# Patient Record
Sex: Female | Born: 1935 | Race: White | Hispanic: No | Smoking: Never smoker
Health system: Southern US, Community
[De-identification: ages and names within clinical notes are randomized; demographics above are authoritative.]

## PROBLEM LIST (undated history)

## (undated) DIAGNOSIS — R002 Palpitations: Secondary | ICD-10-CM

## (undated) DIAGNOSIS — R1013 Epigastric pain: Secondary | ICD-10-CM

## (undated) DIAGNOSIS — E7800 Pure hypercholesterolemia, unspecified: Secondary | ICD-10-CM

## (undated) DIAGNOSIS — R5383 Other fatigue: Secondary | ICD-10-CM

## (undated) DIAGNOSIS — R0602 Shortness of breath: Secondary | ICD-10-CM

## (undated) DIAGNOSIS — I119 Hypertensive heart disease without heart failure: Secondary | ICD-10-CM

## (undated) DIAGNOSIS — H547 Unspecified visual loss: Secondary | ICD-10-CM

## (undated) DIAGNOSIS — M2550 Pain in unspecified joint: Secondary | ICD-10-CM

## (undated) DIAGNOSIS — Z8719 Personal history of other diseases of the digestive system: Secondary | ICD-10-CM

## (undated) DIAGNOSIS — M62838 Other muscle spasm: Secondary | ICD-10-CM

## (undated) DIAGNOSIS — I10 Essential (primary) hypertension: Secondary | ICD-10-CM

## (undated) DIAGNOSIS — R0789 Other chest pain: Secondary | ICD-10-CM

## (undated) DIAGNOSIS — Z8739 Personal history of other diseases of the musculoskeletal system and connective tissue: Secondary | ICD-10-CM

## (undated) DIAGNOSIS — Q248 Other specified congenital malformations of heart: Secondary | ICD-10-CM

## (undated) DIAGNOSIS — T887XXA Unspecified adverse effect of drug or medicament, initial encounter: Secondary | ICD-10-CM

## (undated) DIAGNOSIS — I341 Nonrheumatic mitral (valve) prolapse: Secondary | ICD-10-CM

## (undated) DIAGNOSIS — E785 Hyperlipidemia, unspecified: Secondary | ICD-10-CM

## (undated) DIAGNOSIS — G44009 Cluster headache syndrome, unspecified, not intractable: Secondary | ICD-10-CM

## (undated) DIAGNOSIS — I639 Cerebral infarction, unspecified: Secondary | ICD-10-CM

## (undated) DIAGNOSIS — R42 Dizziness and giddiness: Secondary | ICD-10-CM

## (undated) DIAGNOSIS — R200 Anesthesia of skin: Secondary | ICD-10-CM

## (undated) DIAGNOSIS — Z8679 Personal history of other diseases of the circulatory system: Secondary | ICD-10-CM

## (undated) DIAGNOSIS — R112 Nausea with vomiting, unspecified: Secondary | ICD-10-CM

## (undated) HISTORY — PX: CERVICAL FUSION: SHX112

## (undated) HISTORY — DX: Unspecified visual loss: H54.7

## (undated) HISTORY — PX: CARPAL TUNNEL RELEASE: SHX101

## (undated) HISTORY — DX: Nonrheumatic mitral (valve) prolapse: I34.1

## (undated) HISTORY — DX: Anesthesia of skin: R20.0

## (undated) HISTORY — DX: Unspecified adverse effect of drug or medicament, initial encounter: T88.7XXA

## (undated) HISTORY — DX: Personal history of other diseases of the musculoskeletal system and connective tissue: Z87.39

## (undated) HISTORY — DX: Shortness of breath: R06.02

## (undated) HISTORY — DX: Pain in unspecified joint: M25.50

## (undated) HISTORY — PX: TOTAL ABDOMINAL HYSTERECTOMY: SHX209

## (undated) HISTORY — DX: Other chest pain: R07.89

## (undated) HISTORY — DX: Palpitations: R00.2

## (undated) HISTORY — PX: TONSILLECTOMY: SHX5217

## (undated) HISTORY — PX: BREAST BIOPSY: SHX20

## (undated) HISTORY — DX: Cluster headache syndrome, unspecified, not intractable: G44.009

## (undated) HISTORY — PX: CHOLECYSTECTOMY: SHX55

## (undated) HISTORY — DX: Nausea with vomiting, unspecified: R11.2

## (undated) HISTORY — DX: Other fatigue: R53.83

## (undated) HISTORY — DX: Dizziness and giddiness: R42

## (undated) HISTORY — DX: Epigastric pain: R10.13

## (undated) HISTORY — PX: TOOTH EXTRACTION: SUR596

## (undated) HISTORY — DX: Other specified congenital malformations of heart: Q24.8

## (undated) HISTORY — DX: Essential (primary) hypertension: I10

## (undated) HISTORY — DX: Pure hypercholesterolemia, unspecified: E78.00

## (undated) HISTORY — PX: TRIGGER FINGER RELEASE: SHX641

## (undated) HISTORY — PX: BLADDER FULGURATION: SHX1237

## (undated) HISTORY — DX: Hyperlipidemia, unspecified: E78.5

## (undated) HISTORY — PX: HEMORRHOID SURGERY: SHX153

## (undated) HISTORY — PX: LUMBAR LAMINECTOMY: SHX95

## (undated) HISTORY — DX: Other muscle spasm: M62.838

## (undated) HISTORY — DX: Personal history of other diseases of the digestive system: Z87.19

## (undated) HISTORY — DX: Personal history of other diseases of the circulatory system: Z86.79

---

## 1999-04-24 ENCOUNTER — Encounter: Payer: Self-pay | Admitting: Cardiology

## 1999-04-24 ENCOUNTER — Encounter: Admission: RE | Admit: 1999-04-24 | Discharge: 1999-04-24 | Payer: Self-pay | Admitting: Cardiology

## 2000-05-31 ENCOUNTER — Encounter: Admission: RE | Admit: 2000-05-31 | Discharge: 2000-05-31 | Payer: Self-pay | Admitting: Cardiology

## 2000-05-31 ENCOUNTER — Encounter: Payer: Self-pay | Admitting: Cardiology

## 2001-03-16 ENCOUNTER — Encounter: Payer: Self-pay | Admitting: Neurosurgery

## 2001-03-16 ENCOUNTER — Encounter: Admission: RE | Admit: 2001-03-16 | Discharge: 2001-03-16 | Payer: Self-pay | Admitting: Neurosurgery

## 2001-06-02 ENCOUNTER — Encounter: Payer: Self-pay | Admitting: Cardiology

## 2001-06-02 ENCOUNTER — Encounter: Admission: RE | Admit: 2001-06-02 | Discharge: 2001-06-02 | Payer: Self-pay | Admitting: Cardiology

## 2002-12-05 ENCOUNTER — Encounter: Payer: Self-pay | Admitting: Ophthalmology

## 2002-12-07 ENCOUNTER — Ambulatory Visit (HOSPITAL_COMMUNITY): Admission: RE | Admit: 2002-12-07 | Discharge: 2002-12-07 | Payer: Self-pay | Admitting: Ophthalmology

## 2003-09-13 ENCOUNTER — Inpatient Hospital Stay (HOSPITAL_COMMUNITY): Admission: EM | Admit: 2003-09-13 | Discharge: 2003-09-19 | Payer: Self-pay | Admitting: Emergency Medicine

## 2003-09-16 ENCOUNTER — Encounter: Payer: Self-pay | Admitting: Cardiology

## 2004-10-27 ENCOUNTER — Ambulatory Visit: Payer: Self-pay | Admitting: Cardiology

## 2006-01-17 ENCOUNTER — Encounter: Admission: RE | Admit: 2006-01-17 | Discharge: 2006-01-17 | Payer: Self-pay | Admitting: Gastroenterology

## 2006-02-22 ENCOUNTER — Encounter: Admission: RE | Admit: 2006-02-22 | Discharge: 2006-02-22 | Payer: Self-pay | Admitting: Cardiology

## 2007-03-20 ENCOUNTER — Encounter: Admission: RE | Admit: 2007-03-20 | Discharge: 2007-03-20 | Payer: Self-pay | Admitting: Cardiology

## 2007-07-18 ENCOUNTER — Ambulatory Visit: Payer: Self-pay | Admitting: Internal Medicine

## 2008-02-15 ENCOUNTER — Other Ambulatory Visit: Payer: Self-pay

## 2008-02-16 ENCOUNTER — Inpatient Hospital Stay: Payer: Self-pay | Admitting: Specialist

## 2008-05-08 ENCOUNTER — Encounter: Admission: RE | Admit: 2008-05-08 | Discharge: 2008-05-08 | Payer: Self-pay | Admitting: Cardiology

## 2009-04-15 ENCOUNTER — Encounter: Admission: RE | Admit: 2009-04-15 | Discharge: 2009-04-15 | Payer: Self-pay | Admitting: Cardiology

## 2009-07-13 ENCOUNTER — Emergency Department: Payer: Self-pay | Admitting: Emergency Medicine

## 2009-08-02 ENCOUNTER — Ambulatory Visit: Payer: Self-pay | Admitting: Orthopedic Surgery

## 2009-09-25 ENCOUNTER — Inpatient Hospital Stay (HOSPITAL_COMMUNITY): Admission: EM | Admit: 2009-09-25 | Discharge: 2009-10-02 | Payer: Self-pay | Admitting: Emergency Medicine

## 2009-09-25 ENCOUNTER — Ambulatory Visit: Payer: Self-pay | Admitting: Cardiology

## 2009-12-03 ENCOUNTER — Inpatient Hospital Stay (HOSPITAL_COMMUNITY): Admission: RE | Admit: 2009-12-03 | Discharge: 2009-12-06 | Payer: Self-pay | Admitting: Neurosurgery

## 2010-02-12 ENCOUNTER — Ambulatory Visit: Payer: Self-pay | Admitting: Cardiology

## 2010-06-26 ENCOUNTER — Ambulatory Visit: Payer: Self-pay | Admitting: Cardiology

## 2010-08-30 LAB — COMPREHENSIVE METABOLIC PANEL
AST: 21 U/L (ref 0–37)
Albumin: 3.8 g/dL (ref 3.5–5.2)
Alkaline Phosphatase: 67 U/L (ref 39–117)
CO2: 25 mEq/L (ref 19–32)
Chloride: 103 mEq/L (ref 96–112)
Creatinine, Ser: 1.07 mg/dL (ref 0.4–1.2)
GFR calc Af Amer: 59 mL/min — ABNORMAL LOW (ref >60)
GFR calc non Af Amer: 49 mL/min — ABNORMAL LOW (ref >60)
Potassium: 4 mEq/L (ref 3.5–5.1)
Total Bilirubin: 0.7 mg/dL (ref 0.3–1.2)

## 2010-08-30 LAB — CBC
HCT: 39.5 % (ref 36.0–46.0)
MCV: 97 fL (ref 78.0–100.0)
Platelets: 232 10*3/uL (ref 150–400)
RBC: 4.07 MIL/uL (ref 3.87–5.11)
WBC: 13.7 10*3/uL — ABNORMAL HIGH (ref 4.0–10.5)

## 2010-08-30 LAB — DIFFERENTIAL
Basophils Absolute: 0 10*3/uL (ref 0.0–0.1)
Basophils Relative: 0 % (ref 0–1)
Eosinophils Absolute: 0.5 10*3/uL (ref 0.0–0.7)
Eosinophils Relative: 4 % (ref 0–5)
Monocytes Absolute: 1.1 10*3/uL — ABNORMAL HIGH (ref 0.1–1.0)

## 2010-08-30 LAB — URINALYSIS, ROUTINE W REFLEX MICROSCOPIC
Bilirubin Urine: NEGATIVE
Glucose, UA: NEGATIVE mg/dL
Specific Gravity, Urine: 1.022 (ref 1.005–1.030)
Urobilinogen, UA: 0.2 mg/dL (ref 0.0–1.0)
pH: 5.5 (ref 5.0–8.0)

## 2010-08-30 LAB — PROTIME-INR: Prothrombin Time: 13.4 seconds (ref 11.6–15.2)

## 2010-08-30 LAB — URINE MICROSCOPIC-ADD ON

## 2010-09-01 LAB — COMPREHENSIVE METABOLIC PANEL
ALT: 23 U/L (ref 0–35)
AST: 22 U/L (ref 0–37)
Alkaline Phosphatase: 68 U/L (ref 39–117)
CO2: 22 mEq/L (ref 19–32)
Calcium: 8.3 mg/dL — ABNORMAL LOW (ref 8.4–10.5)
GFR calc Af Amer: >60 mL/min (ref >60)
Potassium: 3.3 mEq/L — ABNORMAL LOW (ref 3.5–5.1)
Sodium: 131 mEq/L — ABNORMAL LOW (ref 135–145)
Total Protein: 5.4 g/dL — ABNORMAL LOW (ref 6.0–8.3)

## 2010-09-01 LAB — BASIC METABOLIC PANEL
BUN: 4 mg/dL — ABNORMAL LOW (ref 6–23)
CO2: 23 mEq/L (ref 19–32)
CO2: 23 mEq/L (ref 19–32)
Calcium: 8.3 mg/dL — ABNORMAL LOW (ref 8.4–10.5)
Calcium: 8.4 mg/dL (ref 8.4–10.5)
Calcium: 8.4 mg/dL (ref 8.4–10.5)
Chloride: 109 mEq/L (ref 96–112)
Chloride: 110 mEq/L (ref 96–112)
Chloride: 112 mEq/L (ref 96–112)
Creatinine, Ser: 0.81 mg/dL (ref 0.4–1.2)
Creatinine, Ser: 0.85 mg/dL (ref 0.4–1.2)
Creatinine, Ser: 0.97 mg/dL (ref 0.4–1.2)
GFR calc Af Amer: >60 mL/min (ref >60)
GFR calc Af Amer: >60 mL/min (ref >60)
GFR calc Af Amer: >60 mL/min (ref >60)
GFR calc Af Amer: >60 mL/min (ref >60)
GFR calc non Af Amer: 54 mL/min — ABNORMAL LOW (ref >60)
GFR calc non Af Amer: >60 mL/min (ref >60)
GFR calc non Af Amer: >60 mL/min (ref >60)
Potassium: 3.1 mEq/L — ABNORMAL LOW (ref 3.5–5.1)
Sodium: 139 mEq/L (ref 135–145)

## 2010-09-01 LAB — CLOSTRIDIUM DIFFICILE EIA

## 2010-09-01 LAB — CBC
Hemoglobin: 11.9 g/dL — ABNORMAL LOW (ref 12.0–15.0)
MCHC: 34.7 g/dL (ref 30.0–36.0)
MCHC: 35.4 g/dL (ref 30.0–36.0)
MCV: 97 fL (ref 78.0–100.0)
Platelets: 166 10*3/uL (ref 150–400)
RBC: 3.42 MIL/uL — ABNORMAL LOW (ref 3.87–5.11)
RBC: 3.52 MIL/uL — ABNORMAL LOW (ref 3.87–5.11)
RBC: 3.96 MIL/uL (ref 3.87–5.11)
RDW: 13.2 % (ref 11.5–15.5)
WBC: 10.1 10*3/uL (ref 4.0–10.5)
WBC: 8.8 10*3/uL (ref 4.0–10.5)

## 2010-09-01 LAB — HEMOCCULT GUIAC POC 1CARD (OFFICE): Fecal Occult Bld: NEGATIVE

## 2010-09-02 LAB — COMPREHENSIVE METABOLIC PANEL
CO2: 25 mEq/L (ref 19–32)
Calcium: 8.9 mg/dL (ref 8.4–10.5)
Creatinine, Ser: 0.8 mg/dL (ref 0.4–1.2)
GFR calc non Af Amer: >60 mL/min (ref >60)
Glucose, Bld: 103 mg/dL — ABNORMAL HIGH (ref 70–99)
Total Protein: 5.9 g/dL — ABNORMAL LOW (ref 6.0–8.3)

## 2010-09-02 LAB — POCT I-STAT, CHEM 8
BUN: 13 mg/dL (ref 6–23)
Calcium, Ion: 1.06 mmol/L — ABNORMAL LOW (ref 1.12–1.32)
Chloride: 103 mEq/L (ref 96–112)
Creatinine, Ser: 0.6 mg/dL (ref 0.4–1.2)
Sodium: 136 mEq/L (ref 135–145)
TCO2: 23 mmol/L (ref 0–100)

## 2010-09-02 LAB — MAGNESIUM: Magnesium: 2.1 mg/dL (ref 1.5–2.5)

## 2010-09-02 LAB — DIFFERENTIAL
Basophils Relative: 0 % (ref 0–1)
Eosinophils Absolute: 0.2 10*3/uL (ref 0.0–0.7)
Lymphs Abs: 3.8 10*3/uL (ref 0.7–4.0)
Monocytes Absolute: 0.6 10*3/uL (ref 0.1–1.0)
Monocytes Relative: 4 % (ref 3–12)
Neutrophils Relative %: 68 % (ref 43–77)

## 2010-09-02 LAB — MRSA PCR SCREENING: MRSA by PCR: NEGATIVE

## 2010-09-02 LAB — CBC
HCT: 39.5 % (ref 36.0–46.0)
Hemoglobin: 13.9 g/dL (ref 12.0–15.0)
MCHC: 35.2 g/dL (ref 30.0–36.0)
MCV: 96.8 fL (ref 78.0–100.0)
RBC: 4.08 MIL/uL (ref 3.87–5.11)
WBC: 14.6 10*3/uL — ABNORMAL HIGH (ref 4.0–10.5)

## 2010-09-02 LAB — CARDIAC PANEL(CRET KIN+CKTOT+MB+TROPI)
CK, MB: 3.9 ng/mL (ref 0.3–4.0)
CK, MB: 4.1 ng/mL — ABNORMAL HIGH (ref 0.3–4.0)
Relative Index: 3.3 — ABNORMAL HIGH (ref 0.0–2.5)
Troponin I: 0.02 ng/mL (ref 0.00–0.06)

## 2010-09-02 LAB — URINALYSIS, ROUTINE W REFLEX MICROSCOPIC
Ketones, ur: NEGATIVE mg/dL
Nitrite: NEGATIVE
Protein, ur: NEGATIVE mg/dL
Urobilinogen, UA: 0.2 mg/dL (ref 0.0–1.0)

## 2010-09-02 LAB — TROPONIN I
Troponin I: 0.01 ng/mL (ref 0.00–0.06)
Troponin I: 0.03 ng/mL (ref 0.00–0.06)

## 2010-09-02 LAB — BASIC METABOLIC PANEL
CO2: 22 mEq/L (ref 19–32)
Chloride: 103 mEq/L (ref 96–112)
GFR calc Af Amer: >60 mL/min (ref >60)
Potassium: 3.9 mEq/L (ref 3.5–5.1)
Sodium: 134 mEq/L — ABNORMAL LOW (ref 135–145)

## 2010-09-02 LAB — DIGOXIN LEVEL: Digoxin Level: 0.8 ng/mL (ref 0.8–2.0)

## 2010-09-02 LAB — LIPASE, BLOOD: Lipase: 19 U/L (ref 11–59)

## 2010-09-02 LAB — PROTIME-INR: INR: 1.06 (ref 0.00–1.49)

## 2010-09-02 LAB — CK TOTAL AND CKMB (NOT AT ARMC)
CK, MB: 4.3 ng/mL — ABNORMAL HIGH (ref 0.3–4.0)
Relative Index: 3.2 — ABNORMAL HIGH (ref 0.0–2.5)
Total CK: 121 U/L (ref 7–177)

## 2010-09-02 LAB — POCT CARDIAC MARKERS
Myoglobin, poc: 147 ng/mL (ref 12–200)
Troponin i, poc: <0.05 ng/mL (ref 0.00–0.09)

## 2010-09-02 LAB — URINE MICROSCOPIC-ADD ON

## 2010-10-12 ENCOUNTER — Other Ambulatory Visit: Payer: Self-pay | Admitting: *Deleted

## 2010-10-12 DIAGNOSIS — I119 Hypertensive heart disease without heart failure: Secondary | ICD-10-CM

## 2010-10-12 MED ORDER — NEBIVOLOL HCL 5 MG PO TABS: 5.0000 mg | ORAL_TABLET | Freq: Every day | ORAL | Status: DC

## 2010-10-12 NOTE — Telephone Encounter (Signed)
Refilled meds per fax request.  

## 2010-10-20 ENCOUNTER — Other Ambulatory Visit: Payer: Self-pay | Admitting: *Deleted

## 2010-10-20 DIAGNOSIS — J302 Other seasonal allergic rhinitis: Secondary | ICD-10-CM

## 2010-10-20 MED ORDER — BECLOMETHASONE DIPROP MONOHYD 42 MCG/SPRAY NA SUSP: NASAL | Status: DC

## 2010-10-20 NOTE — Telephone Encounter (Signed)
Refilled meds per fax request.  

## 2010-10-30 NOTE — H&P (Signed)
NAME:  [name], [name]                        ACCOUNT NO.:  [id number]   MEDICAL RECORD NO.:  [id number]                   PATIENT TYPE:  EMS   LOCATION:  MAJO                                 FACILITY:  [facility]   PHYSICIAN:  Cassell Clement, M.D.              DATE OF BIRTH:  [birth year]   DATE OF ADMISSION:  09/12/2003  DATE OF DISCHARGE:                                HISTORY & PHYSICAL   CHIEF COMPLAINT:  Hypertension, nausea, dizziness and near syncope.   HISTORY OF PRESENT ILLNESS:  This is an 77 year old, widowed, Caucasian  female from [place] who is admitted through the emergency room because of  labile hypertension associated with dizziness, near syncope and nausea.  She  has a past history of mitral valve prolapse and past history of atypical  chest pain.  She reports that she had an outpatient Cardiolite stress test  several years ago which did not show any ischemia.  We do not have details  of that report.  She states that she has also had a history of mitral valve  prolapse since about 1975.  She has had echocardiograms in the past with  results not presently available, however.   The patient states that recently she has been sick for several weeks.  She  saw her ENT physician in [place] earlier this week who did an endoscopic  sinus surgery evaluation and put her on clindamycin.  She has been on  clindamycin for about 3-1/2 days, but stopped it yesterday because her  symptoms of weakness, nausea and dizziness were worsening.  The patient has  also been on prednisone 20 mg twice a day for the past four days given to  her by her ENT doctor.   MEDICATIONS:  1. Protonix 40 mg once a day.  2. Lanoxin 0.125 mg taking 1/2 tablet per day.  3. Zyrtec 10 mg p.r.n.  4. Beconase inhaler one to two sprays b.i.d.  5. Phenergan tablets p.r.n.   ALLERGIES:  ASPIRIN, VALIUM, CODEINE, TETRACYCLINE, ERYTHROMYCIN, SULFA,  PENICILLIN and TEQUIN.   REVIEW OF SYMPTOMS:   CARDIOVASCULAR:  She has occasional chest pain with  walking.  She also notes dizziness and palpitations with walking.  GASTROINTESTINAL:  History of reflux, early satiety with hiatal hernia.  She  denies any diarrhea or constipation.  GENITOURINARY:  No dysuria.  She  reports that anticholinergics caused urinary retention in the past, however.  The remainder of the review of systems is negative in detail.   FAMILY HISTORY:  Mother died at 45 of heart problems and heart failure.  Father died at 76 of a heart attack.   SOCIAL HISTORY:  The patient is a widow.  Her husband died at 95 of a heart  attack.  The patient has one daughter and one son.  Her daughter is a Engineer, civil (consulting)  at [employer].  The patient is a nonsmoker, but was exposed to second hand smoke  from her husband for many years.  She does not use alcohol.  She is a  retired Catering manager.   PHYSICAL EXAMINATION:  VITAL SIGNS:  Blood pressure 200/98 initially and  falls to 133/65, pulse 76 and regular, respirations normal.  HEENT:  Face appears very flushed, although she does not have a fever.  The  sinuses are slightly tender.  NECK:  The carotids are normal.  There is no carotid bruit.  Supple.  Jugular venous pressure normal.  Thyroid normal.  There is no  lymphadenopathy.  CHEST:  Clear to auscultation and percussion.  HEART:  Normal S1, S2.  There is no gallop or rub.  There is a soft systolic  ejection murmur at the left sternal edge.  ABDOMEN:  Soft, nontender.  The liver and spleen are not enlarged.  EXTREMITIES:  Good peripheral pulses.  No edema.  No phlebitis.  NEUROLOGIC:  Physiologic.   LABORATORY DATA AND X-RAY FINDINGS:  Electrocardiogram shows normal sinus  rhythm and is within normal limits with a left axis deviation.  CT of the  head shows no active disease.  Chest x-ray not yet done.   Laboratory studies unremarkable except for a white count of 16,900.  Urinalysis is benign.   IMPRESSION:  1. Labile hypertension.   2. Presyncope, rule out arrhythmia.  3. History of mitral valve prolapse.  4. Recent sinusitis, partially treated.  5. Recent steroid therapy possibly contributing to hypertension.  6. History of gastroesophageal reflux disease.  7. History of multiple allergies.   PLAN:  1. Admit to telemetry.  2. Hold her steroids because of her high blood pressure.  3. Will start her empirically on low-dose HCTZ 12.5 mg daily for blood     pressure stabilization.  4. Will give her Rocephin intravenously for sinus infection.  5. She has been able to take cephalosporins in the past even though she is     allergic to penicillin.  6. Will give her some expectorant in the form of guaifenesin.  7. Give her Phenergan for nausea.  8. Will follow her white count closely.                                                Cassell Clement, M.D.    TB/MEDQ  D:  09/13/2003  T:  09/13/2003  Job:  [id number]

## 2010-10-30 NOTE — Discharge Summary (Signed)
NAMEPARISA, [name]                        ACCOUNT NO.:  [id number]   MEDICAL RECORD NO.:  [id number]                   PATIENT TYPE:  INP   LOCATION:  3715                                 FACILITY:  [facility]   PHYSICIAN:  Cassell Clement, M.D.              DATE OF BIRTH:  [birth year]   DATE OF ADMISSION:  09/12/2003  DATE OF DISCHARGE:  09/19/2003                                 DISCHARGE SUMMARY   FINAL DIAGNOSES:  1. Uncontrolled hypertension.  2. Presyncope and collapse.  3. Esophageal reflux.  4. Allergy to multiple medications.  5. Weakness and fatigue and malaise.   OPERATIONS PERFORMED:  Upper endoscopy by Dr. Carman Ching on September 17, 2003.   HISTORY:  This is an 77 year old Caucasian female admitted with nausea,  dizziness and near syncope.  She has a past history of mitral valve prolapse  and atypical chest pain and she has had previous outpatient Cardiolite  several years ago showing no ischemia.  The patient has been sick for  several weeks.  She had seen a physician in the ENT specialty in [place]  who had treated her with Clindamycin for sinusitis, but the patient stopped  it yesterday on her own because of worsening nausea and weakness.  She has  also been on prednisone 20 mg twice a day for the past four days.   CHRONIC HOME MEDICATIONS:  1. Protonix 40 mg daily.  2. Lanoxin 0.125 mg tablet - take 1/2 tablet a day.  3. Zyrtec 10 mg p.r.n.  4. Beconase inhaler p.r.n.  5. Phenergan tablets p.r.n.   ALLERGIES:  She has a history of allergies to MULTIPLE MEDICATIONS.   PHYSICAL EXAMINATION:  VITAL SIGNS:  On exam in the emergency room, her  initial blood pressure was 200/98, then fell to 133/65 with observation,  pulse was 76 and regular.  HEENT:  Showed that she was flushed in the face.  The carotids were normal.  There was mild sinus tenderness and she had had endoscopic sinus evaluation  in [place] on September 09, 2003, four days earlier.  LUNGS:   Clear.  HEART:  Reveals no gallop or rub.  There is a soft systolic murmur at the  left sternal edge.  ABDOMEN:  Nontender.  Liver and spleen not enlarged.  EXTREMITIES:  Show good pulses, no edema.   EKG:  Within normal limits.   LABORATORY DATA:  Labs were normal except for an elevated white count of  16,900, possibly reflecting steroid effect.  She had a CT of the head prior  to my arrival which was normal, and a chest x-ray was pending at the time of  admission.   Initial impression was labile hypertension with presyncope, rule out  arrhythmia and partially treated sinusitis with elevated white count and  malaise.  She was admitted to a telemetry bed.  Steroids were held because  of hypertension.  She was put on  HCTZ 12.5 mg one daily for mild blood  pressure for her hypertension.  She was placed on Rocephin IV and Humibid LA  was added to her regimen.  Her nausea was treated with p.r.n. Phenergan.  She showed initial improvement, but continued to have some atypical chest  pain.  The chest pain appeared to be helped by Protonix.  It was noted that  the patient had seen Dr. Matthias Hughs several years earlier, and had been told of  an acid reflux condition.  Her serial cardiac enzymes did not show any  evidence of an acute MI, and her EKG showed only minor nonspecific T-wave  changes.  While in the hospital on September 16, 2003, the patient reported  developing diarrhea and she was checked for C. Diff and we stopped the  Rocephin.  White count remained labile.  The patient underwent a two-  dimensional echocardiogram on September 16, 2003, which showed only mild LVH and  a low normal ejection fraction and impaired relaxation, but no specific  cause for her chest pain and no definite mitral valve prolapse.  She  continued to complain of abdominal unrest and serum amylase and lipase were  obtained which were normal, and a repeat alkaline phosphatase was obtained  and was normal.  She was seen in  consultation by Dr. Randa Evens, who  recommended upper endoscopy.  This was done on September 17, 2003, and showed no  significant findings and he felt that she should be treated empirically for  reflux and he gave her an instruction sheet regarding reflux therapy.  On  the day after the endoscopy, the patient complained of increased nausea and  was unable to eat.  She felt that the hydrochlorothiazide might be  contributing to the nausea, and so that was stopped.  She was noted to have  a potassium of 3.3 and she was continued on a small dose of K-Dur.  By September 19, 2003, she was stable enough to be discharged home.  It was noted that  she did have significant elevation of her lipids, but she did refuse to  start statin therapy.   LABORATORY DATA:  Her high sensitivity C-reactive protein was elevated at  2.3.  T4 was 11.4.  TSH 1.6.  Digoxin level on admission was low at 0.3.  Total cholesterol on September 15, 2003, was 238, triglycerides 270 on a fasting  specimen, HDL 43, LDL 141.  White count on admission was 16,000 and went to  19,000 on September 15, 2003, and was 9000 on discharge.  Sed rate was 12.   A chest x-ray showed no evidence of active disease, although there was mild  generalized peribronchial thickening suggesting a bronchitis.   Her electrocardiogram showed nonspecific T-wave abnormalities, which were  nonspecific.   The patient was able to be discharged improved on September 19, 2003, on the  following regimen:   1. Lanoxin 0.125 mg daily.  2. Beconase AQ one to two sprays twice a day.  3. Zyrtec 10 mg daily p.r.n.  4. Protonix 40 mg twice a day.  5. Phenergan 25 mg tablets as needed for nausea.   She is to walk as tolerated.  She will be on a low-cholesterol diet and no  added salt.  She refused to start medicine for cholesterol.  She is to see  Dr. Patty Sermons for an office visit in one month and at that time we will repeat a fasting lipid panel and see how she is doing with  dietary treatment  of her cholesterol problem.   CONDITION ON DISCHARGE:  Improved.                                                Cassell Clement, M.D.    TB/MEDQ  D:  10/02/2003  T:  10/04/2003  Job:  [id number]   cc:   Fayrene Fearing L. Malon Kindle., M.D.  [address]  [place]  [place] [address]  Fax: [phone]

## 2010-10-30 NOTE — H&P (Signed)
[name], [name]                        ACCOUNT NO.:  [id number]   MEDICAL RECORD NO.:  [id number]                   PATIENT TYPE:  OIB   LOCATION:  2853                                 FACILITY:  [facility]   PHYSICIAN:  [name], M.D.             DATE OF BIRTH:  12-Feb-1921   DATE OF ADMISSION:  12/07/2002  DATE OF DISCHARGE:  12/07/2002                                HISTORY & PHYSICAL   REASON FOR ADMISSION:  This was a planned outpatient re-admission of this 77-  year-old white female admitted for cataract implant surgery of the left eye.   HISTORY OF PRESENT ILLNESS:  This patient has been noted to have progressive  cataract formation in both eyes.  She was previously admitted to this  hospital on March 19, 1997, for extracapsular cataract extraction with  phacoemulsification and insertion of a posterior chamber intraocular lens  implant.  This procedure was uncomplicated, and visual acuity improved  following the surgery to 20/40.  Later, YAG laser capsulotomy was performed  on March 22, 2001, and vision continued to be once again 20/40 plus 2.  Recently however, the patient has noted trouble seeing with her unoperated  left eye.  This is variable, and the patient notes that it takes time to  focus and that she is having difficulty reading small print.  Examination  revealed the nuclear cataract progression in her left eye, and it was  elected to proceed with similar surgery at this time.  The patient was given  oral discussion and printed information concerning the procedure and its  possible complications.  She signed an informed consent, and arrangements  were made for her outpatient admission.   PAST MEDICAL HISTORY:  The patient is in stable general health under the  care of her regular physician.  His name is Dr. Ronny Fernandez, who feels the  patient is in good general health with a stable cardiorespiratory system.   MEDICATIONS:  Dr. Patty Fernandez notes the  patient's current medications include:  1. Lanoxin 0.125 mg 1/2 tablet daily.  2. Zyrtec 10 mg daily.  3. Protonix 40 mg daily.  4. Nitroglycerin 1/150 p.r.n.   ALLERGIES:  The patient has multiple allergies including:  1. ASPIRIN.  2. VALIUM.  3. CODEINE.  4. TETRACYCLINE.  5. ERYTHROMYCIN.  6. SULFA.  7. GANTRISIN.  8. PENICILLIN.  9. LECTIN.  10.      TEQUIN.  11.      She also has some intolerance to PLASTIC TAPE.   PHYSICAL EXAMINATION:  VITAL SIGNS:  As recorded on admission, blood  pressure 161/78, temperature 97.3 heart rate 70, respirations 18.  GENERAL:  The patient is a pleasant, well-developed, well-nourished 81-year-  old white female in no acute distress.  HEENT:  Eyes:  Visual acuity last recorded at 20/50 right eye, 20/60- left  eye.  Applanation tonometry 12 mm right eye, 11 left eye.  External  ocular  and slit lamp examination:  The eyes are white and clear with a clear cornea  deep in clear anterior chamber.  The right eye has a posterior chamber  intraocular lens implant with an open clear posterior capsule.  Left eye  reveals nuclear cataract formation.  Detailed fundus examination reveals a  clear vitreous, attached retina with normal optic nerve, disc cup ratio 0.4,  blood vessels and macula normal.   IMPRESSION:  1. Cataract, left eye.  2. Pseudoaphakia, right eye.   PLAN:  Proceed with cataract implant surgery, left eye now.                                                 [name], M.D.    HNJ/MEDQ  D:  12/07/2002  T:  12/08/2002  Job:  [id number]

## 2010-10-30 NOTE — Consult Note (Signed)
[name], [name]                        ACCOUNT NO.:  [id number]   MEDICAL RECORD NO.:  [id number]                   PATIENT TYPE:  INP   LOCATION:  3715                                 FACILITY:  [facility]   PHYSICIAN:  James L. Malon Kindle., M.D.          DATE OF BIRTH:  31-Jul-1920   DATE OF CONSULTATION:  09/16/2003  DATE OF DISCHARGE:                                   CONSULTATION   REFERRING PHYSICIAN:  Cassell Clement, M.D.   HISTORY:  The patient is an 56 white female seen a number of years ago by  Dr. Matthias Hughs.  She was admitted for hypertension, nausea, dizziness, and near  syncope and has had atypical chest pain.  Cardiac disease has been ruled out  regarding myocardial infarction in that she has had negative cardiac  enzymes.  She describes a long history of chest pain that starts off her  epigastrium, goes to the substernal area, and at times goes up into her  jaws.  It appears to be nonexertional.  She had an outpatient Cardiolite  stress test several years ago that was reported to not show ischemia.  She  has had reflux.  Apparently reflux was diagnosed by Dr. Matthias Hughs in the mid  1990s when she had an endoscopy.  Apparently she has been on something for  heartburn for a number of years.  She states this was initially treated  with Tagamet, then she progressed onto H2 blockers, unable to tolerate  PRILOSEC due to some abdominal discomfort.  She has been on Protonix for at  least two to three years and was on Protonix when she came in with the pain.   She has this pain nearly daily; sometimes it is worse than other times.  It  tends to wax and wane in intensity.  Sometimes it is exacerbated by eating,  and other times it wakes her up in the middle of the night. Again, doubling  Protonix does not really seem to make a difference.  She has some problems  swallowing, and at times feels that swallowing will be uncomfortable.  This  is not consistent and seems to be worse  for solids rather than liquids.  She  has not lost weight.  She came to the hospital with these symptoms and  elevated blood pressure at 200/98 with dizziness.  She is clinically feeling  much better.   She had seen her ENT physician in [place] who performed endoscopic sinus  surgery due to sinusitis, and she had been started on clindamycin and  steroids, was given Rocephin here for a possible sinusitis rather than the  clindamycin, and the Rocephin has been stopped.  She did have a little bit  of loose stools, and she reports her headache and sinus discomfort seems to  have resolved.  The patient notes no weight loss.   MEDICATIONS ON ADMISSION:  1. Protonix 40 mg once daily to twice daily.  2. Lanoxin 0.125, 1/2 tablet daily.  3. Zyrtec 10 mg p.r.n.  4. Beconase inhaler 1 to 2 sprays b.i.d.  5. Phenergan p.r.n.   ALLERGIES:  ASPIRIN, VALIUM, CODEINE, TETRACYCLINE, ERYTHROMYCIN, SULFA,  PENICILLIN, TEQUIN, and ALL PAIN PILLS in that they make her crazy.  It  appears she has not ever really had a clear allergic reaction to most of  these medicines but just seems to be intolerant of them.  They cause  diarrhea, dizziness, nausea, and this type of thing.   PAST MEDICAL HISTORY:  1. History of pancreatitis with severe abdominal pain.  Amylase was up near     2000 and normalized in a woman who is status post cholecystectomy.  She     was seen by Dr. Matthias Hughs for this in the mid 1990s.  Cholangiogram was     ordered.  She had a previous CT.  Apparently this is all okay.  The cause     of her pancreatitis is not entirely clear.  2. Degenerative joint disease with multiple degenerative areas of concern.  3. Gastroesophageal reflux disease treated with acid-reducing medications     for 30 years.  4. History of mitral valve prolapse.  5. Aortic stenosis.  6. Recent sinusitis status post some type of sinus surgery down in     [place].   PAST SURGICAL HISTORY:  1. Cholecystectomy.   2. Tonsillectomy.  3. Hemorrhoidectomy.  4. Appendectomy.  5. Total abdominal hysterectomy done in two different procedures.  6. Carpal tunnel surgery.  7. History of cervical spine fusion.   FAMILY HISTORY:  Negative for GI cancer, gallstones, or ulcers.   SOCIAL HISTORY:  The patient is a widow, has two grown children.  Has never  been a smoker or drinker.   REVIEW OF SYSTEMS:  Remarkable for lack of clear dyspepsia, lack of  postprandial pain radiating to the back.  Bowel movements have been  adequate, no change in bowel habits until she started antibiotics when she  had diarrhea.  General Review of Systems reveals shortness of breath on  exertion which has been getting progressively worse.   PHYSICAL EXAMINATION:  VITAL SIGNS:  Blood pressure 130/60, pulse 70 and  regular.  GENERAL:  Alert, nonicteric, white female in no acute distress.  HEENT:  Eyes: Sclerae nonicteric.  Extraocular movements intact.  NECK:  Supple with no lymphadenopathy.  LUNGS:  Clear.  HEART:  Regular rate and rhythm without murmurs or gallops.  ABDOMEN:  Soft, presently nontender with excellent bowel sounds.  EXTREMITIES:  Without edema.   LABORATORY AND X-RAY DATA:  Hemoglobin 15.6, white count 19.4.  Sed rate 12.  Pro time 14.2.  Glucose slightly elevated at 118.  Liver tests completely  normal.  CRP 2.3.   IMPRESSION:  1. Atypical chest pain, could be reflux in origin.  There is some element of     dysphagia or odynophagia.  I think it would certainly be reasonable to go     ahead with an endoscopy.  2. Shortness of breath on exertion, possible cardiac versus age.  3. Sinusitis.  4. History of pancreatitis.   PLAN:  1. Go ahead and perform an endoscopy in the morning.  I have discussed this     with the patient, and she is agreeable.  2. Will check amylase and lipase to see if there could be some element of     esophagitis involvement.  James L.  Malon Kindle., M.D.    Waldron Session  D:  09/16/2003  T:  09/17/2003  Job:  [id number]   cc:   Cassell Clement, M.D.  [address], [address]  [place]  [place] [address]  Fax: [phone]   Bernette Redbird, M.D.  [address]  [place], [place] [address]  Fax: [phone]

## 2010-10-30 NOTE — Op Note (Signed)
[name], [name]                        ACCOUNT NO.:  [id number]   MEDICAL RECORD NO.:  [id number]                   PATIENT TYPE:  OIB   LOCATION:  2853                                 FACILITY:  [facility]   PHYSICIAN:  Guadelupe Sabin, M.D.             DATE OF BIRTH:  [birth year]   DATE OF PROCEDURE:  12/07/2002  DATE OF DISCHARGE:  12/07/2002                                 OPERATIVE REPORT   PREOPERATIVE DIAGNOSIS:  Nuclear cataract, left eye.   POSTOPERATIVE DIAGNOSIS:  Nuclear cataract, left eye.   PROCEDURE:  Planned extracapsular cataract extraction, phacoemulsification,  primary insertion of posterior chamber intraocular lens implant.   SURGEON:  Guadelupe Sabin, M.D.   ASSISTANT:  Nurse.   ANESTHESIA:  Local 4% Xylocaine, 0.75% Marcaine with Wydase added for  retrobulbar block, topical tetracaine, intraocular Xylocaine.  Anesthesia  stand-by required in this cardiac patient.  The patient was given sodium  pentothal intravenously during the period of retrobulbar injection.   DESCRIPTION OF PROCEDURE:  After the patient was prepped and draped, a lid  speculum was inserted in the left eye.  Schiotz tenometer was recorded at 6  to 7 scale units with a 5.5 g weight indicating a normal intraocular  pressure.  A peritomy was performed adjacent to the limbus from the 11 to 1  o'clock position.  The corneoscleral junction was cleaned and a  corneoscleral groove made with a 45 degree Superblade.  The anterior chamber  was then entered with the 2.5 mm diamond keratome at the 12 o'clock position  and the 15 degree blade at the 2:30 position.  Using a bent 26 gauge needle  on a Healon syringe, a circular capsulorrhexis was begun, and then completed  the Grabow forceps.  Hydrodissection and hydrodelineation were performed  using 1% Xylocaine for additional anesthesia.  A 30 degree emulsification  tip was then inserted with slow control of the emulsification of the lens  nucleus.  Total ultrasonic time was 53 seconds, average power level was 18%,  total amount of fluid used was 90 cc.  Following removal of the nucleus the  residual rather adherent cortex was removed from the posterior capsular bag.  The posterior capsule appeared intact with a brilliant red fundus reflex.  It was therefore elected to insert an [employer] [id number] silicone  three piece posterior chamber intraocular lens implant.  Diopter strength  +22.5.  This was inserted with the McDonald forceps into the anterior  chamber, and then centered into the capsular bag using the [facility] lens  rotator.  The lens appeared to be well centered.  The Healon which had been  used throughout the procedure was aspirated and replaced with balanced salt  solution and Miochol ophthalmic solution.  The incision appeared to be self-  sealing, but it was elected to place one radial 10-0 interrupted nylon  suture to ensure closure.  Maxitrol ointment was instilled  in the  conjunctival cul-de-sac, and a light patch and protector shield applied.  Duration of procedure and anesthesia administration was 45 minutes.  The  patient tolerated the procedure well in general, left the operating room for  the recovery room in good condition.                                               Guadelupe Sabin, M.D.   HNJ/MEDQ  D:  12/07/2002  T:  12/08/2002  Job:  [id number]

## 2010-10-30 NOTE — Op Note (Signed)
[name], [name]                        ACCOUNT NO.:  [id number]   MEDICAL RECORD NO.:  [id number]                   PATIENT TYPE:  INP   LOCATION:  3715                                 FACILITY:  [facility]   PHYSICIAN:  James L. Malon Kindle., M.D.          DATE OF BIRTH:  Apr 20, 1921   DATE OF PROCEDURE:  09/17/2003  DATE OF DISCHARGE:                                 OPERATIVE REPORT   PROCEDURE:  Esophagogastroduodenoscopy.   MEDICATIONS GIVEN:  Cetacaine spray, Fentanyl 50 mcg, Versed 4 mg IV.   INDICATIONS FOR PROCEDURE:  Atypical chest pain with a history of reflux.   DESCRIPTION OF PROCEDURE:  The procedure was explained to the patient and  consent obtained.  With the patient in the left lateral decubitus position,  the Olympus scope was inserted and advanced.  The stomach was entered, the  pylorus identified and passed.  The duodenum including the bulb and second  portion was seen well and was not remarkable.  No obstruction or  inflammation.  The scope was withdrawn and the duodenal bulb and pyloric  channel were normal as were the antrum and body of the stomach.  The fundus  and cardia were seen on the retroflex view and were normal.  There was no  significant hiatal hernia.  The proximal esophagus was endoscopically  normal.  The scope was withdrawn.  The patient tolerated the procedure well.   ASSESSMENT:  Atypical chest pain with no obvious cause on endoscopy.   PLAN:  Will continue reflux, give reflux sheet to the patient, and will  continue Protonix.  Will follow up as an outpatient if her pain continues.                                               James L. Malon Kindle., M.D.    Waldron Session  D:  09/17/2003  T:  09/17/2003  Job:  [id number]   cc:   Cassell Clement, M.D.  [address]  [place]  [place] [address]  Fax: [phone]

## 2010-11-16 ENCOUNTER — Telehealth: Payer: Self-pay | Admitting: Cardiology

## 2010-11-16 NOTE — Telephone Encounter (Signed)
PT NOT DUE IN UNTIL MED July, DIDN'T WANT THAT APPT, WANTS TO BE SEEN THIS MONTH. WOULD ONLY SAY SHE IS HAVING ISSUES. CHART IN BOX.

## 2010-11-16 NOTE — Telephone Encounter (Signed)
Scheduled appointment for patient.  Advised to call back if changes her mind and wants to be seen sooner

## 2010-11-16 NOTE — Telephone Encounter (Signed)
Spoke with patient and she stated she has stopped all of her blood pressure and is feeling better.  blood pressure is about the same.  Working on decreasing the stress in her life.  FYI  Did offer her an appointment before July, she would like to wait

## 2010-11-16 NOTE — Telephone Encounter (Signed)
Agree with advice given

## 2010-12-08 ENCOUNTER — Encounter: Payer: Self-pay | Admitting: *Deleted

## 2010-12-14 ENCOUNTER — Encounter: Payer: Self-pay | Admitting: Cardiology

## 2010-12-14 ENCOUNTER — Ambulatory Visit (INDEPENDENT_AMBULATORY_CARE_PROVIDER_SITE_OTHER): Payer: Medicare Other | Admitting: Cardiology

## 2010-12-14 VITALS — BP 170/96 | HR 84 | Wt 114.0 lb

## 2010-12-14 DIAGNOSIS — E7800 Pure hypercholesterolemia, unspecified: Secondary | ICD-10-CM

## 2010-12-14 DIAGNOSIS — I119 Hypertensive heart disease without heart failure: Secondary | ICD-10-CM

## 2010-12-14 DIAGNOSIS — M48061 Spinal stenosis, lumbar region without neurogenic claudication: Secondary | ICD-10-CM

## 2010-12-14 DIAGNOSIS — I10 Essential (primary) hypertension: Secondary | ICD-10-CM

## 2010-12-14 DIAGNOSIS — R42 Dizziness and giddiness: Secondary | ICD-10-CM

## 2010-12-14 DIAGNOSIS — Z87898 Personal history of other specified conditions: Secondary | ICD-10-CM | POA: Insufficient documentation

## 2010-12-14 HISTORY — DX: Hypertensive heart disease without heart failure: I11.9

## 2010-12-14 MED ORDER — VERAPAMIL HCL ER 120 MG PO TBCR: 120.0000 mg | EXTENDED_RELEASE_TABLET | Freq: Every day | ORAL | Status: DC

## 2010-12-14 MED ORDER — ERGOCALCIFEROL 1.25 MG (50000 UT) PO CAPS: 50000.0000 [IU] | ORAL_CAPSULE | ORAL | Status: DC

## 2010-12-14 NOTE — Progress Notes (Signed)
[name] Date of Birth:  11/21/1920 [facility] / [facility] [address]   [address] [place], [place]  [address] [phone]           Fax   [phone]  History of Present Illness: This pleasant 77 year old woman comes in for a scheduled followup office visit.  She has a history of essential hypertension and a history of hypercholesterolemia.  She's also had previous severe spinal stenosis.  She is intolerant of most medications that we have tried on her.  She is presently on no blood pressure medication because of side effects of everything that we have tried.  She has not had a trial of verapamil recently however and we will try verapamil and see if this might help her.  She does not have any history of ischemic heart disease.  She had a normal nuclear stress test preop for back pain in March 2011.  She had an echocardiogram 01/03/08 showing normal left ventricular systolic function and impaired relaxation and mild aortic sclerosis.  Current Outpatient Prescriptions  Medication Sig Dispense Refill  . beclomethasone (BECONASE AQ) 42 MCG/SPRAY nasal spray 2 puffs 2 times, as directed  25 g  2  . cetirizine (ZYRTEC) 10 MG tablet Take 10 mg by mouth daily.        . digoxin (LANOXIN) 0.125 MG tablet Take 125 mcg by mouth daily.        . DimenhyDRINATE (DRAMAMINE PO) Take 1 tablet by mouth as needed.        . ergocalciferol (VITAMIN D2) 50000 UNITS capsule Take 1 capsule (50,000 Units total) by mouth once a week.  4 capsule  12  . famotidine (PEPCID) 20 MG tablet Take 20 mg by mouth daily.        . Ondansetron HCl (ZOFRAN PO) Take 1 tablet by mouth as needed.        Marland Kitchen DISCONTD: ergocalciferol (VITAMIN D2) 50000 UNITS capsule Take 50,000 Units by mouth once a week.        . verapamil (CALAN-SR) 120 MG CR tablet Take 1 tablet (120 mg total) by mouth daily.  30 tablet  11  . DISCONTD: nebivolol (BYSTOLIC) 5 MG tablet Take 1 tablet (5 mg total) by mouth daily.  30 tablet  11    . DISCONTD: ranitidine (ZANTAC) 150 MG tablet Take 150 mg by mouth as needed.          Allergies  Allergen Reactions  . Amlodipine Nausea Only  . Aspirin   . Axid   . Bystolic (Nebivolol Hcl)     Vision problem  . Codeine   . Cozaar   . Elavil (Amitriptyline Hcl)   . Erythromycin   . Gantrisin (Sulfisoxazole)   . Hctz (Hydrochlorothiazide)   . Lopressor (Metoprolol Tartrate)   . Micardis (Telmisartan)   . Penicillins   . Phenergan   . Prilosec (Omeprazole)   . Protonix   . Streptomycin   . Sulfa Drugs Cross Reactors   . Tequin   . Tetracyclines & Related   . Toprol Xl (Metoprolol Succinate)   . Valium   . Valturna (Aliskiren-Valsartan) Nausea And Vomiting    Patient Active Problem List  Diagnoses  . Benign hypertensive heart disease without heart failure  . Spinal stenosis of lumbar region  . Vertigo  . Hypercholesterolemia    History  Smoking status  . Never Smoker   Smokeless tobacco  . Not on file    History  Alcohol Use: Not  on file    No family history on file.  Review of Systems: Constitutional: no fever chills diaphoresis or fatigue or change in weight.  Head and neck: no hearing loss, no epistaxis, no photophobia or visual disturbance. Respiratory: No cough, shortness of breath or wheezing. Cardiovascular: No chest pain peripheral edema, palpitations. Gastrointestinal: No abdominal distention, no abdominal pain, no change in bowel habits hematochezia or melena. Genitourinary: No dysuria, no frequency, no urgency, no nocturia. Musculoskeletal:No arthralgias, no back pain, no gait disturbance or myalgias. Neurological: No headaches, no numbness, no seizures, no syncope, no weakness, no tremors.Positive for vertigo Hematologic: No lymphadenopathy, no easy bruising. Psychiatric: No confusion, no hallucinations, no sleep disturbance.    Physical Exam: Filed Vitals:   12/14/10 1435  BP: 170/96  Pulse: 84  The general appearance reveals an  elderly woman in no acute distress.The head and neck exam reveals pupils equal and reactive.  Extraocular movements are full.  There is no scleral icterus.  The mouth and pharynx are normal.  The neck is supple.  The carotids reveal no bruits.  The jugular venous pressure is normal.  The  thyroid is not enlarged.  There is no lymphadenopathy.  The chest is clear to percussion and auscultation.  There are no rales or rhonchi.  Expansion of the chest is symmetrical.  The precordium is quiet.  The first heart sound is normal.  The second heart sound is physiologically split.  There is no murmur gallop rub or click.  There is no abnormal lift or heave.  The abdomen is soft and nontender.  The bowel sounds are normal.  The liver and spleen are not enlarged.  There are no abdominal masses.  There are no abdominal bruits.  Extremities reveal good pedal pulses.  There is no phlebitis or edema.  There is no cyanosis or clubbing.  Strength is normal and symmetrical in all extremities.  There is no lateralizing weakness.  There are no sensory deficits.  The skin is warm and dry.  There is no rash.  EKG shows normal sinus rhythm and left axis deviation but no ischemic changes.  Assessment / Plan: For her blood pressure elevation we are going to try generic Calan SR 120 mg one daily.  Recheck in 6 months for followup office visit and EKG

## 2010-12-14 NOTE — Assessment & Plan Note (Signed)
The patient has a past history of elevated blood pressure.  Unfortunately she is intolerant of most medications previously we had had her on a beta blocker which she tolerated for a while and then began having side effects.  It affected her vision and she had difficulty reading or thinking straight.  She also is complaining of increasing dizziness.  She does have meclizine on hand for p.r.n. use

## 2010-12-14 NOTE — Assessment & Plan Note (Signed)
The patient has a past history of surgery for spinal stenosis.  The surgery was successful in helping to correct her severe incapacitating back pain.  She still has some intermittent discomfort in her legs.  She has been taking vitamin D 50,000 units once a week and she thinks that that has helped and we refilled that for her today at her request.

## 2010-12-14 NOTE — Assessment & Plan Note (Signed)
The patient has a past history of elevated cholesterol but is intolerant of the medication we have tried

## 2010-12-15 ENCOUNTER — Encounter: Payer: Self-pay | Admitting: Cardiology

## 2010-12-29 ENCOUNTER — Telehealth: Payer: Self-pay | Admitting: Cardiology

## 2010-12-29 NOTE — Telephone Encounter (Signed)
PT HAS A QUESTION FOR MELINDA REGARDING ONE OF HER MEDICATIONS. CHART IN BOX.

## 2010-12-29 NOTE — Telephone Encounter (Signed)
Stay off Calan from now on.  Add it to her list of allergies. Continue heart healthy low sodium diet and other meds

## 2010-12-29 NOTE — Telephone Encounter (Signed)
Unable to tolerate Calan.  Caused weakness and increased staggering.  Has been off for about a week and these have improved.  Please advise

## 2010-12-30 NOTE — Telephone Encounter (Signed)
Advised to continue off and work on diet

## 2011-01-06 ENCOUNTER — Other Ambulatory Visit: Payer: Self-pay | Admitting: *Deleted

## 2011-01-06 DIAGNOSIS — J302 Other seasonal allergic rhinitis: Secondary | ICD-10-CM

## 2011-01-06 MED ORDER — BECLOMETHASONE DIPROP MONOHYD 42 MCG/SPRAY NA SUSP: NASAL | Status: DC

## 2011-01-06 NOTE — Telephone Encounter (Signed)
Refilled meds per fax request.  

## 2011-03-12 ENCOUNTER — Telehealth: Payer: Self-pay | Admitting: Cardiology

## 2011-03-12 NOTE — Telephone Encounter (Signed)
blood pressure 189/88 at last check.  Has not been checking at home.  Last night 199/91 when she went to bed.  Was burning up so she took it.  Stressful day yesterday.  Not taking any blood pressure medications secondary to reaction.  Had a headache and took antihistamine and headache went away.  Takes antihistamine and anti nausea combination medication (states it is dramamine).  Has had "dull nausea" and this helps.  Please advise

## 2011-03-12 NOTE — Telephone Encounter (Signed)
Pt calling re bp high, last reading 156/90 done about 11a

## 2011-03-12 NOTE — Telephone Encounter (Signed)
Advised patient

## 2011-03-12 NOTE — Telephone Encounter (Signed)
Since she is allergic to all of the medicines that we have tried on her, treat the blood pressure with extra rest and avoidance of salt

## 2011-04-12 ENCOUNTER — Other Ambulatory Visit: Payer: Self-pay | Admitting: *Deleted

## 2011-04-12 DIAGNOSIS — J302 Other seasonal allergic rhinitis: Secondary | ICD-10-CM

## 2011-04-12 MED ORDER — DIGOXIN 125 MCG PO TABS: 125.0000 ug | ORAL_TABLET | Freq: Every day | ORAL | Status: DC

## 2011-04-12 MED ORDER — BECLOMETHASONE DIPROP MONOHYD 42 MCG/SPRAY NA SUSP: NASAL | Status: DC

## 2011-04-12 NOTE — Telephone Encounter (Signed)
Refilled meds per fax request.  

## 2011-06-21 ENCOUNTER — Ambulatory Visit: Payer: Medicare Other | Admitting: Cardiology

## 2011-06-28 ENCOUNTER — Ambulatory Visit (INDEPENDENT_AMBULATORY_CARE_PROVIDER_SITE_OTHER): Payer: Medicare Other | Admitting: Cardiology

## 2011-06-28 ENCOUNTER — Encounter: Payer: Self-pay | Admitting: Cardiology

## 2011-06-28 VITALS — BP 138/98 | HR 78 | Ht 63.0 in | Wt 108.0 lb

## 2011-06-28 DIAGNOSIS — R634 Abnormal weight loss: Secondary | ICD-10-CM

## 2011-06-28 DIAGNOSIS — I119 Hypertensive heart disease without heart failure: Secondary | ICD-10-CM | POA: Diagnosis not present

## 2011-06-28 DIAGNOSIS — R110 Nausea: Secondary | ICD-10-CM | POA: Diagnosis not present

## 2011-06-28 DIAGNOSIS — E7800 Pure hypercholesterolemia, unspecified: Secondary | ICD-10-CM

## 2011-06-28 DIAGNOSIS — R5381 Other malaise: Secondary | ICD-10-CM

## 2011-06-28 DIAGNOSIS — R5383 Other fatigue: Secondary | ICD-10-CM | POA: Diagnosis not present

## 2011-06-28 LAB — HEPATIC FUNCTION PANEL
Bilirubin, Direct: 0.1 mg/dL (ref 0.0–0.3)
Total Bilirubin: 0.7 mg/dL (ref 0.3–1.2)
Total Protein: 6.3 g/dL (ref 6.0–8.3)

## 2011-06-28 LAB — CBC WITH DIFFERENTIAL/PLATELET
Basophils Absolute: 0.1 10*3/uL (ref 0.0–0.1)
Eosinophils Absolute: 0.5 10*3/uL (ref 0.0–0.7)
HCT: 38.9 % (ref 36.0–46.0)
Hemoglobin: 13.4 g/dL (ref 12.0–15.0)
Lymphs Abs: 5.9 10*3/uL — ABNORMAL HIGH (ref 0.7–4.0)
MCHC: 34.5 g/dL (ref 30.0–36.0)
Monocytes Absolute: 0.7 10*3/uL (ref 0.1–1.0)
Neutro Abs: 6.1 10*3/uL (ref 1.4–7.7)
RDW: 13.3 % (ref 11.5–14.6)

## 2011-06-28 LAB — BASIC METABOLIC PANEL
Calcium: 8.4 mg/dL (ref 8.4–10.5)
Creatinine, Ser: 0.8 mg/dL (ref 0.4–1.2)

## 2011-06-28 NOTE — Progress Notes (Signed)
[name] Date of Birth:  [birth year] [facility] [address] [place], [place]  [address] [phone]         Fax   [phone]  History of Present Illness: This pleasant 78 year old woman is seen for a scheduled followup office visit.  She has a history of hypercholesterolemia and history of essential hypertension.  She also has a prior history of severe back pain secondary to severe spinal stenosis.  Recently she's been having more problems with nausea without vomiting.  She finds that the only thing that helps her is to take it Dramamine every day.  She has lost 6 pounds since last visit.  She does not have any history of ischemic heart disease and she had a normal nuclear stress test preop for back pain surgery in March 2011.  An echocardiogram in 2009 showed normal LV systolic function with impaired relaxation and mild aortic sclerosis.  Current Outpatient Prescriptions  Medication Sig Dispense Refill  . beclomethasone (BECONASE AQ) 42 MCG/SPRAY nasal spray 2 puffs 2 times, as directed  25 g  3  . cetirizine (ZYRTEC) 10 MG tablet Take 10 mg by mouth daily. As needed      . digoxin (LANOXIN) 0.125 MG tablet Take 1 tablet (125 mcg total) by mouth daily.  90 tablet  3  . DimenhyDRINATE (DRAMAMINE PO) Take 1 tablet by mouth daily.       . ergocalciferol (VITAMIN D2) 50000 UNITS capsule Take 1 capsule (50,000 Units total) by mouth once a week.  4 capsule  12  . famotidine (PEPCID) 20 MG tablet Take 20 mg by mouth daily.        . Ondansetron HCl (ZOFRAN PO) Take 1 tablet by mouth as needed.          Allergies  Allergen Reactions  . Amlodipine Nausea Only  . Aspirin   . Axid   . Bystolic (Nebivolol Hcl)     Vision problem  . Calan (Verapamil Hcl)     Weakness and staggering  . Codeine   . Cozaar   . Elavil (Amitriptyline Hcl)   . Erythromycin   . Gantrisin (Sulfisoxazole)   . Hctz (Hydrochlorothiazide)   . Lopressor (Metoprolol Tartrate)   .  Micardis (Telmisartan)   . Penicillins   . Phenergan   . Prilosec (Omeprazole)   . Protonix   . Streptomycin   . Sulfa Drugs Cross Reactors   . Tequin   . Tetracyclines & Related   . Toprol Xl (Metoprolol Succinate)   . Valium   . Valturna (Aliskiren-Valsartan) Nausea And Vomiting    Patient Active Problem List  Diagnoses  . Benign hypertensive heart disease without heart failure  . Spinal stenosis of lumbar region  . Vertigo  . Hypercholesterolemia    History  Smoking status  . Never Smoker   Smokeless tobacco  . Not on file    History  Alcohol Use: Not on file    No family history on file.  Review of Systems: Constitutional: no fever chills diaphoresis or fatigue or change in weight.  Head and neck: no hearing loss, no epistaxis, no photophobia or visual disturbance. Respiratory: No cough, shortness of breath or wheezing. Cardiovascular: No chest pain peripheral edema, palpitations. Gastrointestinal: No abdominal distention, no abdominal pain, no change in bowel habits hematochezia or melena. Genitourinary: No dysuria, no frequency, no urgency, no nocturia. Musculoskeletal:No arthralgias, no back pain, no gait disturbance or myalgias. Neurological: No dizziness, no headaches, no numbness, no  seizures, no syncope, no weakness, no tremors. Hematologic: No lymphadenopathy, no easy bruising. Psychiatric: No confusion, no hallucinations, no sleep disturbance.    Physical Exam: Filed Vitals:   06/28/11 1414  BP: 138/98  Pulse: 78   general appearance reveals a well-developed thin elderly woman in no acute distress.Pupils equal and reactive.   Extraocular Movements are full.  There is no scleral icterus.  The mouth and pharynx are normal.  The neck is supple.  The carotids reveal no bruits.  The jugular venous pressure is normal.  The thyroid is not enlarged.  There is no lymphadenopathy.  The chest is clear to percussion and auscultation. There are no rales or  rhonchi. Expansion of the chest is symmetrical.  The precordium is quiet.  The first heart sound is normal.  The second heart sound is physiologically split.  There is no murmur gallop rub or click.  There is no abnormal lift or heave.  The abdomen is soft and nontender. Bowel sounds are normal. The liver and spleen are not enlarged. There Are no abdominal masses. There are no bruits.  The pedal pulses are good.  There is no phlebitis or edema.  There is no cyanosis or clubbing. Strength is normal and symmetrical in all extremities.  There is no lateralizing weakness.  There are no sensory deficits.  The skin is warm and dry.  There is no rash.    Assessment / Plan: Continue present medication.  Get blood work today to look into her nonintentional weight loss.  Recheck in 6 months for followup office visit EKG and fasting lipid panel and chemistries and CBC.

## 2011-06-28 NOTE — Assessment & Plan Note (Signed)
The patient has a history of labile hypertension for she is on low-dose verapamil one daily.  She is intolerant to ACE inhibitors and to beta blockers.

## 2011-06-28 NOTE — Assessment & Plan Note (Signed)
The patient attributes her weight loss up 6 pounds to the fact that she's been eating less because of her persistent nausea.  The etiology of her nausea is not clear at the present time.  Previous GI workup by neurologist have not revealed any specific etiology.  We will get some screening lab work today including a CBC hepatic function panel and a TSH to look into her weight loss more thoroughly

## 2011-06-28 NOTE — Assessment & Plan Note (Signed)
Patient has a long history of hypercholesterolemia.  She is intolerant to lipid lowering medication.

## 2011-06-28 NOTE — Patient Instructions (Signed)
Will obtain labs today and call you with the results (tsh/cbc/bmet/hfp) Your physician recommends that you continue on your current medications as directed. Please refer to the Current Medication list given to you today. Your physician wants you to follow-up in: 6 months You will receive a reminder letter in the mail two months in advance. If you don't receive a letter, please call our office to schedule the follow-up appointment.

## 2011-06-29 ENCOUNTER — Telehealth: Payer: Self-pay | Admitting: *Deleted

## 2011-06-29 NOTE — Telephone Encounter (Signed)
Message copied by Burnell Blanks on Tue Jun 29, 2011  2:12 PM ------      Message from: Cassell Clement      Created: Mon Jun 28, 2011  7:40 PM       Please report.  The blood work is stable.  There is no anemia and her thyroid function is normal.  The liver and kidney tests are normal.  Continue same medication

## 2011-06-29 NOTE — Telephone Encounter (Signed)
Advised of labs 

## 2011-07-06 ENCOUNTER — Observation Stay: Payer: Self-pay | Admitting: Internal Medicine

## 2011-07-06 DIAGNOSIS — R1110 Vomiting, unspecified: Secondary | ICD-10-CM | POA: Diagnosis not present

## 2011-07-06 DIAGNOSIS — R42 Dizziness and giddiness: Secondary | ICD-10-CM | POA: Diagnosis not present

## 2011-07-06 DIAGNOSIS — R5381 Other malaise: Secondary | ICD-10-CM | POA: Diagnosis not present

## 2011-07-06 DIAGNOSIS — I10 Essential (primary) hypertension: Secondary | ICD-10-CM | POA: Diagnosis not present

## 2011-07-06 DIAGNOSIS — Z9889 Other specified postprocedural states: Secondary | ICD-10-CM | POA: Diagnosis not present

## 2011-07-06 DIAGNOSIS — B9789 Other viral agents as the cause of diseases classified elsewhere: Secondary | ICD-10-CM | POA: Diagnosis not present

## 2011-07-06 DIAGNOSIS — Z885 Allergy status to narcotic agent status: Secondary | ICD-10-CM | POA: Diagnosis not present

## 2011-07-06 DIAGNOSIS — E785 Hyperlipidemia, unspecified: Secondary | ICD-10-CM | POA: Diagnosis not present

## 2011-07-06 DIAGNOSIS — R197 Diarrhea, unspecified: Secondary | ICD-10-CM | POA: Diagnosis not present

## 2011-07-06 DIAGNOSIS — M4800 Spinal stenosis, site unspecified: Secondary | ICD-10-CM | POA: Diagnosis not present

## 2011-07-06 DIAGNOSIS — Z881 Allergy status to other antibiotic agents status: Secondary | ICD-10-CM | POA: Diagnosis not present

## 2011-07-06 DIAGNOSIS — R110 Nausea: Secondary | ICD-10-CM | POA: Diagnosis not present

## 2011-07-06 DIAGNOSIS — B370 Candidal stomatitis: Secondary | ICD-10-CM | POA: Diagnosis not present

## 2011-07-06 DIAGNOSIS — R059 Cough, unspecified: Secondary | ICD-10-CM | POA: Diagnosis not present

## 2011-07-06 DIAGNOSIS — Z880 Allergy status to penicillin: Secondary | ICD-10-CM | POA: Diagnosis not present

## 2011-07-06 DIAGNOSIS — J3489 Other specified disorders of nose and nasal sinuses: Secondary | ICD-10-CM | POA: Diagnosis not present

## 2011-07-06 DIAGNOSIS — E860 Dehydration: Secondary | ICD-10-CM | POA: Diagnosis not present

## 2011-07-06 DIAGNOSIS — Z90710 Acquired absence of both cervix and uterus: Secondary | ICD-10-CM | POA: Diagnosis not present

## 2011-07-06 DIAGNOSIS — R630 Anorexia: Secondary | ICD-10-CM | POA: Diagnosis not present

## 2011-07-06 DIAGNOSIS — R079 Chest pain, unspecified: Secondary | ICD-10-CM | POA: Diagnosis not present

## 2011-07-06 DIAGNOSIS — Z886 Allergy status to analgesic agent status: Secondary | ICD-10-CM | POA: Diagnosis not present

## 2011-07-06 DIAGNOSIS — Z888 Allergy status to other drugs, medicaments and biological substances status: Secondary | ICD-10-CM | POA: Diagnosis not present

## 2011-07-06 DIAGNOSIS — R5383 Other fatigue: Secondary | ICD-10-CM | POA: Diagnosis not present

## 2011-07-06 DIAGNOSIS — R112 Nausea with vomiting, unspecified: Secondary | ICD-10-CM | POA: Diagnosis not present

## 2011-07-06 DIAGNOSIS — Z79899 Other long term (current) drug therapy: Secondary | ICD-10-CM | POA: Diagnosis not present

## 2011-07-06 DIAGNOSIS — I059 Rheumatic mitral valve disease, unspecified: Secondary | ICD-10-CM | POA: Diagnosis not present

## 2011-07-06 DIAGNOSIS — R634 Abnormal weight loss: Secondary | ICD-10-CM | POA: Diagnosis not present

## 2011-07-06 LAB — CBC
HCT: 43.2 % (ref 35.0–47.0)
HGB: 14.9 g/dL (ref 12.0–16.0)
RBC: 4.54 10*6/uL (ref 3.80–5.20)
RDW: 11.9 % (ref 11.5–14.5)
WBC: 10.2 10*3/uL (ref 3.6–11.0)

## 2011-07-06 LAB — URINALYSIS, COMPLETE
Bilirubin,UR: NEGATIVE
Ph: 7 (ref 4.5–8.0)
Protein: NEGATIVE
RBC,UR: <1 /HPF (ref 0–5)

## 2011-07-06 LAB — COMPREHENSIVE METABOLIC PANEL
Alkaline Phosphatase: 102 U/L (ref 50–136)
Anion Gap: 11 (ref 7–16)
Bilirubin,Total: 1 mg/dL (ref 0.2–1.0)
Chloride: 101 mmol/L (ref 98–107)
Co2: 27 mmol/L (ref 21–32)
EGFR (Non-African Amer.): >60
Osmolality: 279 (ref 275–301)
Potassium: 3.4 mmol/L — ABNORMAL LOW (ref 3.5–5.1)
Sodium: 139 mmol/L (ref 136–145)

## 2011-07-06 LAB — TROPONIN I: Troponin-I: 0.02 ng/mL

## 2011-07-06 LAB — RAPID INFLUENZA A&B ANTIGENS

## 2011-07-06 LAB — DIGOXIN LEVEL: Digoxin: 0.69 ng/mL

## 2011-07-07 DIAGNOSIS — R110 Nausea: Secondary | ICD-10-CM | POA: Diagnosis not present

## 2011-07-07 DIAGNOSIS — R5383 Other fatigue: Secondary | ICD-10-CM | POA: Diagnosis not present

## 2011-07-07 DIAGNOSIS — B370 Candidal stomatitis: Secondary | ICD-10-CM | POA: Diagnosis not present

## 2011-07-07 DIAGNOSIS — B9789 Other viral agents as the cause of diseases classified elsewhere: Secondary | ICD-10-CM | POA: Diagnosis not present

## 2011-07-07 DIAGNOSIS — R5381 Other malaise: Secondary | ICD-10-CM | POA: Diagnosis not present

## 2011-07-07 LAB — BASIC METABOLIC PANEL
Chloride: 105 mmol/L (ref 98–107)
Co2: 21 mmol/L (ref 21–32)
Creatinine: 0.76 mg/dL (ref 0.60–1.30)
EGFR (Non-African Amer.): >60
Osmolality: 277 (ref 275–301)
Potassium: 3.3 mmol/L — ABNORMAL LOW (ref 3.5–5.1)
Sodium: 139 mmol/L (ref 136–145)

## 2011-07-08 DIAGNOSIS — R110 Nausea: Secondary | ICD-10-CM | POA: Diagnosis not present

## 2011-07-08 DIAGNOSIS — B9789 Other viral agents as the cause of diseases classified elsewhere: Secondary | ICD-10-CM | POA: Diagnosis not present

## 2011-07-08 DIAGNOSIS — R5383 Other fatigue: Secondary | ICD-10-CM | POA: Diagnosis not present

## 2011-07-08 DIAGNOSIS — R5381 Other malaise: Secondary | ICD-10-CM | POA: Diagnosis not present

## 2011-07-08 DIAGNOSIS — B370 Candidal stomatitis: Secondary | ICD-10-CM | POA: Diagnosis not present

## 2011-07-08 LAB — CBC WITH DIFFERENTIAL/PLATELET
Basophil #: 0.1 10*3/uL (ref 0.0–0.1)
Basophil %: 0.5 %
Eosinophil #: 0.2 10*3/uL (ref 0.0–0.7)
Eosinophil %: 2 %
HCT: 37.7 % (ref 35.0–47.0)
HGB: 12.7 g/dL (ref 12.0–16.0)
Lymphocyte #: 3.5 10*3/uL (ref 1.0–3.6)
Lymphocyte %: 32.6 %
MCH: 32.5 pg (ref 26.0–34.0)
MCHC: 33.8 g/dL (ref 32.0–36.0)
Monocyte #: 0.9 10*3/uL — ABNORMAL HIGH (ref 0.0–0.7)
Neutrophil #: 6 10*3/uL (ref 1.4–6.5)
Neutrophil %: 56.4 %
RDW: 12.9 % (ref 11.5–14.5)
WBC: 10.6 10*3/uL (ref 3.6–11.0)

## 2011-07-08 LAB — BASIC METABOLIC PANEL
BUN: 12 mg/dL (ref 7–18)
Calcium, Total: 7.6 mg/dL — ABNORMAL LOW (ref 8.5–10.1)
Co2: 21 mmol/L (ref 21–32)
EGFR (African American): >60
EGFR (Non-African Amer.): >60
Glucose: 85 mg/dL (ref 65–99)
Osmolality: 280 (ref 275–301)
Potassium: 3.1 mmol/L — ABNORMAL LOW (ref 3.5–5.1)

## 2011-07-12 DIAGNOSIS — R42 Dizziness and giddiness: Secondary | ICD-10-CM | POA: Diagnosis not present

## 2011-07-12 DIAGNOSIS — I10 Essential (primary) hypertension: Secondary | ICD-10-CM | POA: Diagnosis not present

## 2011-07-12 DIAGNOSIS — R5381 Other malaise: Secondary | ICD-10-CM | POA: Diagnosis not present

## 2011-07-12 DIAGNOSIS — M4800 Spinal stenosis, site unspecified: Secondary | ICD-10-CM | POA: Diagnosis not present

## 2011-07-12 DIAGNOSIS — R262 Difficulty in walking, not elsewhere classified: Secondary | ICD-10-CM | POA: Diagnosis not present

## 2011-07-13 DIAGNOSIS — R5381 Other malaise: Secondary | ICD-10-CM | POA: Diagnosis not present

## 2011-07-13 DIAGNOSIS — R262 Difficulty in walking, not elsewhere classified: Secondary | ICD-10-CM | POA: Diagnosis not present

## 2011-07-13 DIAGNOSIS — I10 Essential (primary) hypertension: Secondary | ICD-10-CM | POA: Diagnosis not present

## 2011-07-13 DIAGNOSIS — R42 Dizziness and giddiness: Secondary | ICD-10-CM | POA: Diagnosis not present

## 2011-07-13 DIAGNOSIS — M4800 Spinal stenosis, site unspecified: Secondary | ICD-10-CM | POA: Diagnosis not present

## 2011-07-15 DIAGNOSIS — M4800 Spinal stenosis, site unspecified: Secondary | ICD-10-CM | POA: Diagnosis not present

## 2011-07-15 DIAGNOSIS — R5381 Other malaise: Secondary | ICD-10-CM | POA: Diagnosis not present

## 2011-07-15 DIAGNOSIS — R42 Dizziness and giddiness: Secondary | ICD-10-CM | POA: Diagnosis not present

## 2011-07-15 DIAGNOSIS — R262 Difficulty in walking, not elsewhere classified: Secondary | ICD-10-CM | POA: Diagnosis not present

## 2011-07-15 DIAGNOSIS — I10 Essential (primary) hypertension: Secondary | ICD-10-CM | POA: Diagnosis not present

## 2011-07-16 DIAGNOSIS — M4800 Spinal stenosis, site unspecified: Secondary | ICD-10-CM | POA: Diagnosis not present

## 2011-07-16 DIAGNOSIS — I10 Essential (primary) hypertension: Secondary | ICD-10-CM | POA: Diagnosis not present

## 2011-07-16 DIAGNOSIS — R42 Dizziness and giddiness: Secondary | ICD-10-CM | POA: Diagnosis not present

## 2011-07-16 DIAGNOSIS — R262 Difficulty in walking, not elsewhere classified: Secondary | ICD-10-CM | POA: Diagnosis not present

## 2011-07-16 DIAGNOSIS — R5381 Other malaise: Secondary | ICD-10-CM | POA: Diagnosis not present

## 2011-07-19 DIAGNOSIS — I519 Heart disease, unspecified: Secondary | ICD-10-CM | POA: Diagnosis not present

## 2011-07-19 DIAGNOSIS — I10 Essential (primary) hypertension: Secondary | ICD-10-CM | POA: Diagnosis not present

## 2011-07-19 DIAGNOSIS — J309 Allergic rhinitis, unspecified: Secondary | ICD-10-CM | POA: Diagnosis not present

## 2011-07-19 DIAGNOSIS — R42 Dizziness and giddiness: Secondary | ICD-10-CM | POA: Diagnosis not present

## 2011-07-20 DIAGNOSIS — R262 Difficulty in walking, not elsewhere classified: Secondary | ICD-10-CM | POA: Diagnosis not present

## 2011-07-20 DIAGNOSIS — R5381 Other malaise: Secondary | ICD-10-CM | POA: Diagnosis not present

## 2011-07-20 DIAGNOSIS — M4800 Spinal stenosis, site unspecified: Secondary | ICD-10-CM | POA: Diagnosis not present

## 2011-07-20 DIAGNOSIS — I10 Essential (primary) hypertension: Secondary | ICD-10-CM | POA: Diagnosis not present

## 2011-07-20 DIAGNOSIS — R42 Dizziness and giddiness: Secondary | ICD-10-CM | POA: Diagnosis not present

## 2011-07-23 DIAGNOSIS — R42 Dizziness and giddiness: Secondary | ICD-10-CM | POA: Diagnosis not present

## 2011-07-23 DIAGNOSIS — M4800 Spinal stenosis, site unspecified: Secondary | ICD-10-CM | POA: Diagnosis not present

## 2011-07-23 DIAGNOSIS — I10 Essential (primary) hypertension: Secondary | ICD-10-CM | POA: Diagnosis not present

## 2011-07-23 DIAGNOSIS — R5381 Other malaise: Secondary | ICD-10-CM | POA: Diagnosis not present

## 2011-07-23 DIAGNOSIS — R262 Difficulty in walking, not elsewhere classified: Secondary | ICD-10-CM | POA: Diagnosis not present

## 2011-07-30 DIAGNOSIS — R262 Difficulty in walking, not elsewhere classified: Secondary | ICD-10-CM | POA: Diagnosis not present

## 2011-07-30 DIAGNOSIS — I10 Essential (primary) hypertension: Secondary | ICD-10-CM | POA: Diagnosis not present

## 2011-07-30 DIAGNOSIS — M4800 Spinal stenosis, site unspecified: Secondary | ICD-10-CM | POA: Diagnosis not present

## 2011-07-30 DIAGNOSIS — R42 Dizziness and giddiness: Secondary | ICD-10-CM | POA: Diagnosis not present

## 2011-07-30 DIAGNOSIS — R5383 Other fatigue: Secondary | ICD-10-CM | POA: Diagnosis not present

## 2011-08-02 DIAGNOSIS — R42 Dizziness and giddiness: Secondary | ICD-10-CM | POA: Diagnosis not present

## 2011-08-02 DIAGNOSIS — R5383 Other fatigue: Secondary | ICD-10-CM | POA: Diagnosis not present

## 2011-08-02 DIAGNOSIS — M4800 Spinal stenosis, site unspecified: Secondary | ICD-10-CM | POA: Diagnosis not present

## 2011-08-02 DIAGNOSIS — R262 Difficulty in walking, not elsewhere classified: Secondary | ICD-10-CM | POA: Diagnosis not present

## 2011-08-02 DIAGNOSIS — I10 Essential (primary) hypertension: Secondary | ICD-10-CM | POA: Diagnosis not present

## 2011-08-03 ENCOUNTER — Other Ambulatory Visit: Payer: Self-pay | Admitting: Cardiology

## 2011-08-04 DIAGNOSIS — B351 Tinea unguium: Secondary | ICD-10-CM | POA: Diagnosis not present

## 2011-08-04 DIAGNOSIS — M79609 Pain in unspecified limb: Secondary | ICD-10-CM | POA: Diagnosis not present

## 2011-08-04 NOTE — Telephone Encounter (Signed)
Refilled beconase 

## 2011-08-06 DIAGNOSIS — R262 Difficulty in walking, not elsewhere classified: Secondary | ICD-10-CM | POA: Diagnosis not present

## 2011-08-06 DIAGNOSIS — I10 Essential (primary) hypertension: Secondary | ICD-10-CM | POA: Diagnosis not present

## 2011-08-06 DIAGNOSIS — R5381 Other malaise: Secondary | ICD-10-CM | POA: Diagnosis not present

## 2011-08-06 DIAGNOSIS — M4800 Spinal stenosis, site unspecified: Secondary | ICD-10-CM | POA: Diagnosis not present

## 2011-08-06 DIAGNOSIS — R42 Dizziness and giddiness: Secondary | ICD-10-CM | POA: Diagnosis not present

## 2011-08-09 DIAGNOSIS — R5381 Other malaise: Secondary | ICD-10-CM | POA: Diagnosis not present

## 2011-08-09 DIAGNOSIS — R262 Difficulty in walking, not elsewhere classified: Secondary | ICD-10-CM | POA: Diagnosis not present

## 2011-08-09 DIAGNOSIS — M4800 Spinal stenosis, site unspecified: Secondary | ICD-10-CM | POA: Diagnosis not present

## 2011-08-09 DIAGNOSIS — R42 Dizziness and giddiness: Secondary | ICD-10-CM | POA: Diagnosis not present

## 2011-08-09 DIAGNOSIS — I10 Essential (primary) hypertension: Secondary | ICD-10-CM | POA: Diagnosis not present

## 2011-11-03 DIAGNOSIS — B351 Tinea unguium: Secondary | ICD-10-CM | POA: Diagnosis not present

## 2011-11-03 DIAGNOSIS — M79609 Pain in unspecified limb: Secondary | ICD-10-CM | POA: Diagnosis not present

## 2011-11-09 DIAGNOSIS — B370 Candidal stomatitis: Secondary | ICD-10-CM | POA: Diagnosis not present

## 2011-11-09 DIAGNOSIS — R42 Dizziness and giddiness: Secondary | ICD-10-CM | POA: Diagnosis not present

## 2011-11-30 ENCOUNTER — Other Ambulatory Visit: Payer: Self-pay | Admitting: Cardiology

## 2011-12-06 DIAGNOSIS — R5381 Other malaise: Secondary | ICD-10-CM | POA: Diagnosis not present

## 2011-12-06 DIAGNOSIS — Q248 Other specified congenital malformations of heart: Secondary | ICD-10-CM | POA: Diagnosis not present

## 2011-12-06 DIAGNOSIS — R42 Dizziness and giddiness: Secondary | ICD-10-CM | POA: Diagnosis not present

## 2011-12-06 DIAGNOSIS — B370 Candidal stomatitis: Secondary | ICD-10-CM | POA: Diagnosis not present

## 2011-12-06 DIAGNOSIS — R5383 Other fatigue: Secondary | ICD-10-CM | POA: Diagnosis not present

## 2011-12-15 ENCOUNTER — Other Ambulatory Visit: Payer: Self-pay

## 2011-12-15 DIAGNOSIS — D72829 Elevated white blood cell count, unspecified: Secondary | ICD-10-CM | POA: Diagnosis not present

## 2011-12-15 LAB — CBC WITH DIFFERENTIAL/PLATELET
Basophil #: 0.1 10*3/uL (ref 0.0–0.1)
Eosinophil %: 2.7 %
HCT: 39.4 % (ref 35.0–47.0)
HGB: 13.2 g/dL (ref 12.0–16.0)
Lymphocyte #: 5.3 10*3/uL — ABNORMAL HIGH (ref 1.0–3.6)
MCH: 31.8 pg (ref 26.0–34.0)
MCV: 95 fL (ref 80–100)
Monocyte #: 0.8 x10 3/mm (ref 0.2–0.9)
Neutrophil #: 6.8 10*3/uL — ABNORMAL HIGH (ref 1.4–6.5)
Neutrophil %: 51.2 %
Platelet: 238 10*3/uL (ref 150–440)
RBC: 4.15 10*6/uL (ref 3.80–5.20)
RDW: 13.2 % (ref 11.5–14.5)
WBC: 13.2 10*3/uL — ABNORMAL HIGH (ref 3.6–11.0)

## 2011-12-27 ENCOUNTER — Other Ambulatory Visit: Payer: Self-pay | Admitting: Cardiology

## 2011-12-27 NOTE — Telephone Encounter (Signed)
Refilled vitamin d.

## 2012-01-05 ENCOUNTER — Other Ambulatory Visit: Payer: Self-pay | Admitting: Cardiology

## 2012-01-17 ENCOUNTER — Encounter: Payer: Self-pay | Admitting: Cardiology

## 2012-01-17 ENCOUNTER — Ambulatory Visit (INDEPENDENT_AMBULATORY_CARE_PROVIDER_SITE_OTHER): Payer: Medicare Other | Admitting: Cardiology

## 2012-01-17 VITALS — BP 138/86 | HR 80 | Ht 63.0 in | Wt 109.0 lb

## 2012-01-17 DIAGNOSIS — R5383 Other fatigue: Secondary | ICD-10-CM

## 2012-01-17 DIAGNOSIS — I119 Hypertensive heart disease without heart failure: Secondary | ICD-10-CM

## 2012-01-17 DIAGNOSIS — E7800 Pure hypercholesterolemia, unspecified: Secondary | ICD-10-CM

## 2012-01-17 DIAGNOSIS — R42 Dizziness and giddiness: Secondary | ICD-10-CM | POA: Diagnosis not present

## 2012-01-17 DIAGNOSIS — R5381 Other malaise: Secondary | ICD-10-CM | POA: Diagnosis not present

## 2012-01-17 NOTE — Assessment & Plan Note (Signed)
Patient has a history of hypercholesterolemia but is intolerant of cholesterol-lowering medicines.  She did not come to the office fasting today so we did not checking lab work

## 2012-01-17 NOTE — Assessment & Plan Note (Signed)
The patient is intolerant to most medications.  At the present time she is on no medication for her blood pressure which is remaining normal with diet and salt restriction

## 2012-01-17 NOTE — Patient Instructions (Addendum)
Your physician recommends that you continue on your current medications as directed. Please refer to the Current Medication list given to you today.  Your physician wants you to follow-up in: 8  Months with fasting labs You will receive a reminder letter in the mail two months in advance. If you don't receive a letter, please call our office to schedule the follow-up appointment.

## 2012-01-17 NOTE — Progress Notes (Signed)
[name] Date of Birth:  [birth year] [facility] [address] [place], [place]  [address] [phone]         Fax   [phone]  History of Present Illness: This pleasant 78 year old woman is seen for a six-month followup office visit.  She has a past history of labile hypertension and hypercholesterolemia.  She also has a history of severe back pain secondary to spinal stenosis.  She's had a lot of problems with dizziness nausea and profound weakness.  She does not have any history of ischemic heart disease.  She had a normal nuclear stress test in March 2011.  She had an echocardiogram in 2009 showing impaired relaxation and mild aortic sclerosis. Current Outpatient Prescriptions  Medication Sig Dispense Refill  . BECONASE AQ 42 MCG/SPRAY nasal spray INHALE 2 PUFFS TWICE DAILY AS NEEDED  25 g  2  . cetirizine (ZYRTEC) 10 MG tablet Take 10 mg by mouth daily. As needed      . digoxin (LANOXIN) 0.125 MG tablet TAKE 1 TABLET (125 MCG TOTAL) BY MOUTH DAILY.  90 tablet  2  . DimenhyDRINATE (DRAMAMINE PO) Take 1 tablet by mouth daily. As needed      . famotidine (PEPCID) 20 MG tablet Take 20 mg by mouth daily.        . Ondansetron HCl (ZOFRAN PO) Take 1 tablet by mouth as needed.        . Vitamin D, Ergocalciferol, (DRISDOL) 50000 UNITS CAPS TAKE 1 CAPSULE (50,000 UNITS TOTAL) BY MOUTH ONCE A WEEK.  4 capsule  11    Allergies  Allergen Reactions  . Amlodipine Nausea Only  . Aspirin   . Bystolic (Nebivolol Hcl)     Vision problem  . Calan (Verapamil Hcl)     Weakness and staggering  . Codeine   . Cozaar   . Elavil (Amitriptyline Hcl)   . Erythromycin   . Gantrisin (Sulfisoxazole)   . Hctz (Hydrochlorothiazide)   . Lopressor (Metoprolol Tartrate)   . Micardis (Telmisartan)   . Nizatidine   . Pantoprazole Sodium   . Penicillins   . Prilosec (Omeprazole)   . Promethazine Hcl   . Streptomycin   . Sulfa Drugs Cross Reactors   . Tequin   .  Tetracyclines & Related   . Toprol Xl (Metoprolol Succinate)   . Valium   . Valturna (Aliskiren-Valsartan) Nausea And Vomiting    Patient Active Problem List  Diagnosis  . Benign hypertensive heart disease without heart failure  . Spinal stenosis of lumbar region  . Vertigo  . Hypercholesterolemia  . Nausea alone    History  Smoking status  . Never Smoker   Smokeless tobacco  . Not on file    History  Alcohol Use: Not on file    No family history on file.  Review of Systems: Constitutional: no fever chills diaphoresis or fatigue or change in weight.  Head and neck: no hearing loss, no epistaxis, no photophobia or visual disturbance. Respiratory: No cough, shortness of breath or wheezing. Cardiovascular: No chest pain peripheral edema, palpitations. Gastrointestinal: No abdominal distention, no abdominal pain, no change in bowel habits hematochezia or melena. Genitourinary: No dysuria, no frequency, no urgency, no nocturia. Musculoskeletal:No arthralgias, no back pain, no gait disturbance or myalgias. Neurological: No dizziness, no headaches, no numbness, no seizures, no syncope, no weakness, no tremors. Hematologic: No lymphadenopathy, no easy bruising. Psychiatric: No confusion, no hallucinations, no sleep disturbance.    Physical Exam: Filed Vitals:  01/17/12 1553  BP: 138/86  Pulse: 80   general appearance reveals a well-developed well-nourished elderly woman in no distress.The head and neck exam reveals pupils equal and reactive.  Extraocular movements are full.  There is no scleral icterus.  The mouth and pharynx are normal.  The neck is supple.  The carotids reveal no bruits.  The jugular venous pressure is normal.  The  thyroid is not enlarged.  There is no lymphadenopathy.  The chest is clear to percussion and auscultation.  There are no rales or rhonchi.  Expansion of the chest is symmetrical.  The precordium is quiet.  The first heart sound is normal.  The  second heart sound is physiologically split.  There is no murmur gallop rub or click.  There is no abnormal lift or heave.  The abdomen is soft and nontender.  The bowel sounds are normal.  The liver and spleen are not enlarged.  There are no abdominal masses.  There are no abdominal bruits.  Extremities reveal good pedal pulses.  There is no phlebitis or edema.  There is no cyanosis or clubbing.  Strength is normal and symmetrical in all extremities.  There is no lateralizing weakness.  There are no sensory deficits.  The skin is warm and dry.  There is no rash.     Assessment / Plan: Continue same medication.  Recheck in 8 months for followup office visit and fasting lab work and we will also get CBC and TSH to evaluate malaise and fatigue.  We're seeing her in 8 months rather than 6 months because she did not want to come to the office in  Influenza season

## 2012-01-17 NOTE — Assessment & Plan Note (Signed)
Patient has frequent episodes of profound lightheadedness and nausea.  She has occasional vomiting but usually no vomiting.  She has a burning when this is coming on his and is able to get to her bed before she blacks out.  Her weakness usually lasts all day.  Dramamine seems to help.  It is not associated with any chest pain.

## 2012-02-02 DIAGNOSIS — M79609 Pain in unspecified limb: Secondary | ICD-10-CM | POA: Diagnosis not present

## 2012-02-02 DIAGNOSIS — B351 Tinea unguium: Secondary | ICD-10-CM | POA: Diagnosis not present

## 2012-02-28 ENCOUNTER — Other Ambulatory Visit: Payer: Self-pay | Admitting: Cardiology

## 2012-02-28 NOTE — Telephone Encounter (Signed)
Refilled digoxin 

## 2012-03-01 ENCOUNTER — Other Ambulatory Visit: Payer: Self-pay | Admitting: Cardiology

## 2012-03-01 NOTE — Telephone Encounter (Signed)
Refilled beconase

## 2012-03-29 DIAGNOSIS — Z85828 Personal history of other malignant neoplasm of skin: Secondary | ICD-10-CM | POA: Diagnosis not present

## 2012-03-29 DIAGNOSIS — D1801 Hemangioma of skin and subcutaneous tissue: Secondary | ICD-10-CM | POA: Diagnosis not present

## 2012-03-29 DIAGNOSIS — L821 Other seborrheic keratosis: Secondary | ICD-10-CM | POA: Diagnosis not present

## 2012-03-31 ENCOUNTER — Other Ambulatory Visit: Payer: Self-pay | Admitting: Cardiology

## 2012-04-12 DIAGNOSIS — B351 Tinea unguium: Secondary | ICD-10-CM | POA: Diagnosis not present

## 2012-04-12 DIAGNOSIS — M79609 Pain in unspecified limb: Secondary | ICD-10-CM | POA: Diagnosis not present

## 2012-04-12 DIAGNOSIS — Z23 Encounter for immunization: Secondary | ICD-10-CM | POA: Diagnosis not present

## 2012-05-04 ENCOUNTER — Other Ambulatory Visit: Payer: Self-pay

## 2012-05-04 MED ORDER — BECLOMETHASONE DIPROP MONOHYD 42 MCG/SPRAY NA SUSP: 2.0000 | Freq: Two times a day (BID) | NASAL | Status: DC

## 2012-06-21 DIAGNOSIS — M79609 Pain in unspecified limb: Secondary | ICD-10-CM | POA: Diagnosis not present

## 2012-06-21 DIAGNOSIS — B351 Tinea unguium: Secondary | ICD-10-CM | POA: Diagnosis not present

## 2012-06-30 ENCOUNTER — Other Ambulatory Visit: Payer: Self-pay | Admitting: *Deleted

## 2012-06-30 MED ORDER — BECLOMETHASONE DIPROP MONOHYD 42 MCG/SPRAY NA SUSP: 2.0000 | Freq: Two times a day (BID) | NASAL | Status: DC

## 2012-08-03 ENCOUNTER — Other Ambulatory Visit: Payer: Self-pay | Admitting: *Deleted

## 2012-08-23 DIAGNOSIS — M79609 Pain in unspecified limb: Secondary | ICD-10-CM | POA: Diagnosis not present

## 2012-08-23 DIAGNOSIS — B351 Tinea unguium: Secondary | ICD-10-CM | POA: Diagnosis not present

## 2012-09-05 ENCOUNTER — Emergency Department: Payer: Self-pay | Admitting: Emergency Medicine

## 2012-09-05 DIAGNOSIS — Z9889 Other specified postprocedural states: Secondary | ICD-10-CM | POA: Diagnosis not present

## 2012-09-05 DIAGNOSIS — M129 Arthropathy, unspecified: Secondary | ICD-10-CM | POA: Diagnosis not present

## 2012-09-05 DIAGNOSIS — Z79899 Other long term (current) drug therapy: Secondary | ICD-10-CM | POA: Diagnosis not present

## 2012-09-05 DIAGNOSIS — R209 Unspecified disturbances of skin sensation: Secondary | ICD-10-CM | POA: Diagnosis not present

## 2012-09-05 DIAGNOSIS — Z9089 Acquired absence of other organs: Secondary | ICD-10-CM | POA: Diagnosis not present

## 2012-09-05 DIAGNOSIS — I10 Essential (primary) hypertension: Secondary | ICD-10-CM | POA: Diagnosis not present

## 2012-09-05 LAB — CBC WITH DIFFERENTIAL/PLATELET
Basophil #: 0.1 10*3/uL (ref 0.0–0.1)
Basophil %: 0.6 %
Eosinophil #: 0.6 10*3/uL (ref 0.0–0.7)
Eosinophil %: 4.5 %
HCT: 41.6 % (ref 35.0–47.0)
HGB: 14.3 g/dL (ref 12.0–16.0)
Lymphocyte #: 4.8 10*3/uL — ABNORMAL HIGH (ref 1.0–3.6)
Lymphocyte %: 34.4 %
MCHC: 34.4 g/dL (ref 32.0–36.0)
Monocyte #: 0.9 x10 3/mm (ref 0.2–0.9)
Neutrophil %: 53.8 %
Platelet: 258 10*3/uL (ref 150–440)
RDW: 13.1 % (ref 11.5–14.5)
WBC: 14 10*3/uL — ABNORMAL HIGH (ref 3.6–11.0)

## 2012-09-05 LAB — BASIC METABOLIC PANEL
Anion Gap: 5 — ABNORMAL LOW (ref 7–16)
BUN: 14 mg/dL (ref 7–18)
Chloride: 106 mmol/L (ref 98–107)
EGFR (Non-African Amer.): >60
Osmolality: 274 (ref 275–301)
Sodium: 137 mmol/L (ref 136–145)

## 2012-09-12 ENCOUNTER — Emergency Department (HOSPITAL_COMMUNITY): Payer: Medicare Other

## 2012-09-12 ENCOUNTER — Inpatient Hospital Stay (HOSPITAL_COMMUNITY)
Admission: EM | Admit: 2012-09-12 | Discharge: 2012-09-15 | DRG: 092 | Disposition: A | Discharge status: Home / Self Care | Payer: Medicare Other | Attending: Internal Medicine | Admitting: Internal Medicine

## 2012-09-12 ENCOUNTER — Encounter (HOSPITAL_COMMUNITY): Payer: Self-pay | Admitting: Vascular Surgery

## 2012-09-12 DIAGNOSIS — S0990XA Unspecified injury of head, initial encounter: Secondary | ICD-10-CM | POA: Diagnosis not present

## 2012-09-12 DIAGNOSIS — R42 Dizziness and giddiness: Secondary | ICD-10-CM | POA: Diagnosis present

## 2012-09-12 DIAGNOSIS — H538 Other visual disturbances: Secondary | ICD-10-CM | POA: Diagnosis present

## 2012-09-12 DIAGNOSIS — R55 Syncope and collapse: Secondary | ICD-10-CM

## 2012-09-12 DIAGNOSIS — R0989 Other specified symptoms and signs involving the circulatory and respiratory systems: Secondary | ICD-10-CM | POA: Diagnosis present

## 2012-09-12 DIAGNOSIS — R51 Headache: Secondary | ICD-10-CM | POA: Diagnosis not present

## 2012-09-12 DIAGNOSIS — Z79899 Other long term (current) drug therapy: Secondary | ICD-10-CM | POA: Diagnosis not present

## 2012-09-12 DIAGNOSIS — E785 Hyperlipidemia, unspecified: Secondary | ICD-10-CM | POA: Diagnosis present

## 2012-09-12 DIAGNOSIS — I119 Hypertensive heart disease without heart failure: Secondary | ICD-10-CM | POA: Diagnosis present

## 2012-09-12 DIAGNOSIS — Z8669 Personal history of other diseases of the nervous system and sense organs: Secondary | ICD-10-CM | POA: Diagnosis not present

## 2012-09-12 DIAGNOSIS — R079 Chest pain, unspecified: Secondary | ICD-10-CM | POA: Diagnosis not present

## 2012-09-12 DIAGNOSIS — R030 Elevated blood-pressure reading, without diagnosis of hypertension: Secondary | ICD-10-CM | POA: Diagnosis not present

## 2012-09-12 DIAGNOSIS — M48061 Spinal stenosis, lumbar region without neurogenic claudication: Secondary | ICD-10-CM | POA: Diagnosis present

## 2012-09-12 DIAGNOSIS — E7800 Pure hypercholesterolemia, unspecified: Secondary | ICD-10-CM | POA: Diagnosis present

## 2012-09-12 DIAGNOSIS — R110 Nausea: Secondary | ICD-10-CM

## 2012-09-12 DIAGNOSIS — R2681 Unsteadiness on feet: Secondary | ICD-10-CM | POA: Diagnosis present

## 2012-09-12 DIAGNOSIS — I059 Rheumatic mitral valve disease, unspecified: Secondary | ICD-10-CM | POA: Diagnosis present

## 2012-09-12 DIAGNOSIS — I951 Orthostatic hypotension: Secondary | ICD-10-CM | POA: Diagnosis present

## 2012-09-12 DIAGNOSIS — K3189 Other diseases of stomach and duodenum: Secondary | ICD-10-CM | POA: Diagnosis not present

## 2012-09-12 DIAGNOSIS — I10 Essential (primary) hypertension: Secondary | ICD-10-CM | POA: Diagnosis not present

## 2012-09-12 DIAGNOSIS — R269 Unspecified abnormalities of gait and mobility: Principal | ICD-10-CM

## 2012-09-12 DIAGNOSIS — R404 Transient alteration of awareness: Secondary | ICD-10-CM | POA: Diagnosis not present

## 2012-09-12 DIAGNOSIS — R519 Headache, unspecified: Secondary | ICD-10-CM | POA: Diagnosis present

## 2012-09-12 DIAGNOSIS — Z87898 Personal history of other specified conditions: Secondary | ICD-10-CM

## 2012-09-12 DIAGNOSIS — R262 Difficulty in walking, not elsewhere classified: Secondary | ICD-10-CM | POA: Diagnosis not present

## 2012-09-12 LAB — CBC WITH DIFFERENTIAL/PLATELET
Basophils Absolute: 0.1 10*3/uL (ref 0.0–0.1)
Basophils Relative: 0 % (ref 0–1)
Eosinophils Absolute: 0.3 10*3/uL (ref 0.0–0.7)
MCH: 31.9 pg (ref 26.0–34.0)
MCHC: 35.1 g/dL (ref 30.0–36.0)
Monocytes Relative: 5 % (ref 3–12)
Neutrophils Relative %: 72 % (ref 43–77)
Platelets: 230 10*3/uL (ref 150–400)
RDW: 12.8 % (ref 11.5–15.5)

## 2012-09-12 LAB — URINALYSIS, ROUTINE W REFLEX MICROSCOPIC
Hgb urine dipstick: NEGATIVE
Nitrite: NEGATIVE
Protein, ur: NEGATIVE mg/dL
Urobilinogen, UA: 0.2 mg/dL (ref 0.0–1.0)

## 2012-09-12 LAB — POCT I-STAT, CHEM 8
Creatinine, Ser: 0.8 mg/dL (ref 0.50–1.10)
Hemoglobin: 14.3 g/dL (ref 12.0–15.0)
Sodium: 136 mEq/L (ref 135–145)
TCO2: 23 mmol/L (ref 0–100)

## 2012-09-12 LAB — POCT I-STAT TROPONIN I

## 2012-09-12 MED ORDER — DIMENHYDRINATE 50 MG PO TABS
50.0000 mg | ORAL_TABLET | Freq: Three times a day (TID) | ORAL | Status: DC | PRN
  Administered 2012-09-14 – 2012-09-15 (×2): 50 mg via ORAL
  Filled 2012-09-12 (×3): qty 1

## 2012-09-12 MED ORDER — DIGOXIN 125 MCG PO TABS
0.1250 mg | ORAL_TABLET | Freq: Every day | ORAL | Status: DC
  Administered 2012-09-13 – 2012-09-14 (×2): 0.125 mg via ORAL
  Filled 2012-09-12 (×3): qty 1

## 2012-09-12 MED ORDER — LORATADINE 10 MG PO TABS
10.0000 mg | ORAL_TABLET | Freq: Every day | ORAL | Status: DC
  Administered 2012-09-15: 10 mg via ORAL
  Filled 2012-09-12 (×3): qty 1

## 2012-09-12 MED ORDER — FAMOTIDINE 20 MG PO TABS
20.0000 mg | ORAL_TABLET | Freq: Every day | ORAL | Status: DC
  Administered 2012-09-13: 20 mg via ORAL
  Filled 2012-09-12 (×2): qty 1

## 2012-09-12 MED ORDER — ACETAMINOPHEN 325 MG PO TABS: 650.0000 mg | ORAL_TABLET | ORAL | Status: DC | PRN

## 2012-09-12 MED ORDER — SODIUM CHLORIDE 0.9 % IV SOLN: INTRAVENOUS | Status: DC

## 2012-09-12 NOTE — Consult Note (Addendum)
Reason for Consult:Gait instability Referring Physician: Toniann Fail  CC: Difficulty walking and feeling as if going to pas out  HPI: [name] is an 79 y.o. female with a history of difficulty with gait.  Reports she has had to use a walker for years.  Has been having more near-falls recently.  Also reports that recently she has been having episodes where she feels as if she is going to pass out.  They are usually short-lived but have been occurring more frequently recently.  Today the patient reports that she awakened at baseline.  She sat down to rest at about 1430.  She awakened later in the afternoon with a severe headache on the top of her head.  When she attempted to stand she felt as if she was going to pass out and was weak all over.  She sat back down but had to go to the bathroom.  She was able to get to her walker and get to the bathroom and back to her bed.  She was nauseous and did eventually vomit.  Reports that whenever she moves to sit up or stand up feels as if she is going to pass out and becomes nauseous.  Does not report any vertigo or dizziness.  Does not report diplopia or difficulty swallowing.  Headache has improved and is now dull and minimal in severity.    Past Medical History  Diagnosis Date  . Hypertension   . Atypical chest pain   . History of spinal stenosis     underwent back surgery on 12/03/09 by Dr. Trey Sailors  . Hyperlipidemia   . Vertigo     positional vertigo  . Dizziness   . Muscle spasm   . Numbness in feet   . Dyspepsia   . Fatigue   . Headaches, cluster   . Joint pain   . Worsening vision   . Shortness of breath   . Heart palpitations   . Nausea and/or vomiting     with walking  . Hypercholesterolemia     with inability to tolerate statin therapy  . Mitral valve prolapse   . History of liver disease     history of liver trouble, has not had any known esposures to jaundiced person  . History of rheumatic fever     as a child  . Chest  discomfort     long history of chest discomfort    Past Surgical History  Procedure Laterality Date  . Tonsillectomy    . Breast biopsy    . Bladder fulguration    . Cholecystectomy    . Total abdominal hysterectomy    . Hemorrhoid surgery    . Cervical fusion    . Carpal tunnel release      bilaterally  . Lumbar laminectomy      Family history: Mother and maternal grandfather are deceased from CHF.  Father and paternal grandmother are deceased from CAD.    Social History:  reports that she has never smoked. She does not have any smokeless tobacco history on file. She reports that she does not drink alcohol or use illicit drugs.  Allergies  Allergen Reactions  . Amlodipine Nausea Only  . Aspirin   . Bystolic (Nebivolol Hcl)     Vision problem  . Calan (Verapamil Hcl)     Weakness and staggering  . Cephalosporins   . Codeine   . Cozaar   . Elavil (Amitriptyline Hcl)   . Erythromycin   .  Gantrisin (Sulfisoxazole)   . Hctz (Hydrochlorothiazide)   . Iodides   . Lopressor (Metoprolol Tartrate)   . Micardis (Telmisartan)   . Morphine And Related   . Nizatidine   . Nsaids   . Pantoprazole Sodium   . Penicillins   . Prilosec (Omeprazole)   . Promethazine Hcl   . Streptomycin   . Sulfa Drugs Cross Reactors   . Tequin   . Tetracyclines & Related   . Toprol Xl (Metoprolol Succinate)   . Ultram (Tramadol)   . Valium   . Valturna (Aliskiren-Valsartan) Nausea And Vomiting    Medications: I have reviewed the patient's current medications. Prior to Admission:  Current outpatient prescriptions: cetirizine (ZYRTEC) 10 MG tablet, Take 10 mg by mouth daily as needed for allergies. As needed, Disp: , Rfl: ;   digoxin (LANOXIN) 0.125 MG tablet, Take 0.125 mg by mouth daily., Disp: , Rfl: ;   dimenhyDRINATE (DRAMAMINE) 50 MG tablet, Take 50 mg by mouth every 8 (eight) hours as needed (for nausea)., Disp: , Rfl: ;   famotidine (PEPCID) 20 MG tablet, Take 20 mg by mouth daily.   , Disp: , Rfl:  sodium chloride (OCEAN) 0.65 % nasal spray, Place 1 spray into the nose as needed for congestion., Disp: , Rfl:   ROS: History obtained from the patient  General ROS: negative for - chills, fatigue, fever, night sweats, weight gain or weight loss Psychological ROS: negative for - behavioral disorder, hallucinations, memory difficulties, mood swings or suicidal ideation Ophthalmic ROS: negative for - blurry vision, double vision, eye pain or loss of vision ENT ROS: as noted in HPI Allergy and Immunology ROS: negative for - hives or itchy/watery eyes Hematological and Lymphatic ROS: negative for - bleeding problems, bruising or swollen lymph nodes Endocrine ROS: negative for - galactorrhea, hair pattern changes, polydipsia/polyuria or temperature intolerance Respiratory ROS: negative for - cough, hemoptysis, shortness of breath or wheezing Cardiovascular ROS: negative for - chest pain, dyspnea on exertion, edema or irregular heartbeat Gastrointestinal ROS: as noted in HPI Genito-Urinary ROS: nocturia Musculoskeletal ROS: negative for - joint swelling or muscular weakness Neurological ROS: as noted in HPI Dermatological ROS: negative for rash and skin lesion changes  Physical Examination: Blood pressure 179/86, pulse 67, temperature 98.4 F (36.9 C), temperature source Oral, resp. rate 15, SpO2 98.00%.  Neurologic Examination Mental Status: Alert, oriented, thought content appropriate.  Speech fluent without evidence of aphasia.  Able to follow 3 step commands without difficulty. Cranial Nerves: II: Discs flat bilaterally; Visual fields grossly normal, pupils equal, round, reactive to light and accommodation III,IV, VI: ptosis not present, extra-ocular motions intact bilaterally V,VII: smile symmetric, facial light touch sensation normal bilaterally VIII: hearing normal bilaterally IX,X: gag reflex present XI: bilateral shoulder shrug XII: midline tongue  extension Motor: Right : Upper extremity   5/5    Left:     Upper extremity   5/5  Lower extremity   4-/5     Lower extremity   4-/5 Tone and bulk:normal tone throughout; no atrophy noted Sensory: Pinprick and light touch intact throughout with decreased sensation in the feet bilaterally Deep Tendon Reflexes: 2+ and symmetric with absent AJ's bilaterally Plantars: Right: downgoing   Left: downgoing Cerebellar: Finger-to-nose and heel-to-shin testing without evidence of dysmetria but with endpoint tremor Gait: Unable to test.  Patient becomes extremely shaky with standing CV: pulses palpable throughout   Laboratory Studies:   Basic Metabolic Panel:  Recent Labs Lab 09/12/12 1900  NA 136  K 4.1  CL 103  GLUCOSE 124*  BUN 14  CREATININE 0.80    Liver Function Tests: No results found for this basename: AST, ALT, ALKPHOS, BILITOT, PROT, ALBUMIN,  in the last 168 hours No results found for this basename: LIPASE, AMYLASE,  in the last 168 hours No results found for this basename: AMMONIA,  in the last 168 hours  CBC:  Recent Labs Lab 09/12/12 1826 09/12/12 1900  WBC 12.8*  --   NEUTROABS 9.2*  --   HGB 13.9 14.3  HCT 39.6 42.0  MCV 90.8  --   PLT 230  --     Cardiac Enzymes: No results found for this basename: CKTOTAL, CKMB, CKMBINDEX, TROPONINI,  in the last 168 hours  BNP: No components found with this basename: POCBNP,   CBG: No results found for this basename: GLUCAP,  in the last 168 hours  Microbiology: Results for orders placed during the hospital encounter of 12/03/09  SURGICAL PCR SCREEN     Status: None   Collection Time    11/28/09  3:59 PM      Result Value Range Status   MRSA, PCR NEGATIVE  NEGATIVE Final   Staphylococcus aureus    NEGATIVE Final   Value: NEGATIVE            The Xpert SA Assay (FDA     approved for NASAL specimens     only), is one component of     a comprehensive surveillance     program.  It is not intended     to diagnose  infection nor to     guide or monitor treatment.    Coagulation Studies: No results found for this basename: LABPROT, INR,  in the last 72 hours  Urinalysis:  Recent Labs Lab 09/12/12 2013  COLORURINE YELLOW  LABSPEC 1.007  PHURINE 8.0  GLUCOSEU NEGATIVE  HGBUR NEGATIVE  BILIRUBINUR NEGATIVE  KETONESUR NEGATIVE  PROTEINUR NEGATIVE  UROBILINOGEN 0.2  NITRITE NEGATIVE  LEUKOCYTESUR SMALL*    Lipid Panel:  No results found for this basename: chol, trig, hdl, cholhdl, vldl, ldlcalc    HgbA1C:  No results found for this basename: HGBA1C    Urine Drug Screen:   No results found for this basename: labopia, cocainscrnur, labbenz, amphetmu, thcu, labbarb    Alcohol Level: No results found for this basename: ETH,  in the last 168 hours  Imaging: Dg Chest 2 View  09/12/2012  *RADIOLOGY REPORT*  Clinical Data: Chest pain, hypertension, mitral valve prolapse  CHEST - 2 VIEW  Comparison: 09/24/2009; 04/15/2009; chest CT - 09/24/2009  Findings:  Grossly unchanged cardiac silhouette and mediastinal contours with mild tortuosity of the thoracic aorta.  The lungs remain hyperexpanded with flattening of the bilateral hemidiaphragms.  No focal airspace opacity.  No pleural effusion or pneumothorax.  No definite evidence of edema.  Unchanged bones.  IMPRESSION: Hyperexpanded lungs without acute cardiopulmonary disease.   Original Report Authenticated By: Tacey Ruiz, MD    Ct Head Wo Contrast  09/12/2012  *RADIOLOGY REPORT*  Clinical Data: Near-syncope  CT HEAD WITHOUT CONTRAST  Technique:  Contiguous axial images were obtained from the base of the skull through the vertex without contrast.  Comparison: 09/13/2003  Findings: Mild global atrophy appropriate to age.  Minimal chronic ischemic changes in the periventricular white matter.  No mass effect, midline shift, or acute intracranial hemorrhage.  Mastoid air cells and visualized paranasal sinuses are clear.  Cranium is intact.  IMPRESSION: No  acute intracranial pathology.   Original Report Authenticated By: Jolaine Click, M.D.      Assessment/Plan: 79 year old female with a history of gait instability and BPV who presents with worsening gait and headache.  Patient also describes symptoms that are suggestive of orthostasis.  Patient with multiple vascular risk factors including poorly controlled hypertension.  Can not rue out the possibility of posterior circulation compromise.  Would further investigate.  CT of the head shows no acute changes.     Recommendations: 1.  CTA of the head tonight.  If unremarkable, patient will likely not benefit from a LP.  Patient and daughter are not enthusiastic about this procedure being done. 2.  MRI of the head without contrast.  If evidence of an acute infarct would initiate a stroke work up at that time.  Patient allergic to ASA. 3.  Orthostatic BP with HR q shift 4.  Frequent neuro checks 5.  PT consult 6.  BP control     Thana Farr, MD [facility] [phone] 09/12/2012, 11:12 PM

## 2012-09-12 NOTE — H&P (Signed)
[facility] History and Physical  [name] ZOX:[id number] DOB: 1921-01-10 DOA: 09/12/2012  Referring physician: ER physician. PCP: No primary provider on file. Dr. Patty Sermons. Specialists: Dr. Patty Sermons.  Chief Complaint: Difficulty walking and headache.  HPI: [name] is a 79 y.o. female with history of labile hypertension and hyperlipidemia and mitral valve prolapse presented to the ER because of difficulty walking. Patient had these symptoms since 3 PM today. Her symptoms initially started out with severe headache which started off as a pop sound. Following which patient found it difficult to walk. She also was little confused and not sure if she lost her consciousness. Since the symptoms persisted she came to the ER. Her headache has largely improved but still persist. She still has difficulty walking. Denies any difficulty seeing and talking or any loss of function of the upper or lower extremities. Denies any incontinence of urine or bowel. CT of the head was negative for any acute. At this time since patient has had spinal stenosis and surgery, lumbar puncture through fluoroscopy guided under radiology has been ordered to look for any xanthochromia. Patient will be admitted for further management. Patient blood pressure has been running high and patient and patient's daughter at this time states that she usually runs very high blood pressure in the systolics around 180s to 190 and diastolic around the 90s and she is very sensitive to antihypertensives and presently is not on any medications for blood pressure control. Denies any chest pain shortness of breath abdominal pain diarrhea or vomiting.  Review of Systems: As presented in the history of presenting illness, rest negative.  Past Medical History  Diagnosis Date  . Hypertension   . Atypical chest pain   . History of spinal stenosis     underwent back surgery on 12/03/09 by Dr. Trey Sailors  . Hyperlipidemia   .  Vertigo     positional vertigo  . Dizziness   . Muscle spasm   . Numbness in feet   . Dyspepsia   . Fatigue   . Headaches, cluster   . Joint pain   . Worsening vision   . Shortness of breath   . Heart palpitations   . Nausea and/or vomiting     with walking  . Hypercholesterolemia     with inability to tolerate statin therapy  . Mitral valve prolapse   . History of liver disease     history of liver trouble, has not had any known esposures to jaundiced person  . History of rheumatic fever     as a child  . Chest discomfort     long history of chest discomfort   Past Surgical History  Procedure Laterality Date  . Tonsillectomy    . Breast biopsy    . Bladder fulguration    . Cholecystectomy    . Total abdominal hysterectomy    . Hemorrhoid surgery    . Cervical fusion    . Carpal tunnel release      bilaterally  . Lumbar laminectomy     Social History:  reports that she has never smoked. She does not have any smokeless tobacco history on file. She reports that she does not drink alcohol or use illicit drugs. Lives at home. where does patient live-- Can do ADLs. Can patient participate in ADLs?  Allergies  Allergen Reactions  . Amlodipine Nausea Only  . Aspirin   . Bystolic (Nebivolol Hcl)     Vision problem  . Calan (Verapamil  Hcl)     Weakness and staggering  . Cephalosporins   . Codeine   . Cozaar   . Elavil (Amitriptyline Hcl)   . Erythromycin   . Gantrisin (Sulfisoxazole)   . Hctz (Hydrochlorothiazide)   . Iodides   . Lopressor (Metoprolol Tartrate)   . Micardis (Telmisartan)   . Morphine And Related   . Nizatidine   . Nsaids   . Pantoprazole Sodium   . Penicillins   . Prilosec (Omeprazole)   . Promethazine Hcl   . Streptomycin   . Sulfa Drugs Cross Reactors   . Tequin   . Tetracyclines & Related   . Toprol Xl (Metoprolol Succinate)   . Ultram (Tramadol)   . Valium   . Valturna (Aliskiren-Valsartan) Nausea And Vomiting    History  reviewed. No pertinent family history.    Prior to Admission medications   Medication Sig Start Date End Date Taking? Authorizing Provider  cetirizine (ZYRTEC) 10 MG tablet Take 10 mg by mouth daily as needed for allergies. As needed   Yes Historical Provider, MD  digoxin (LANOXIN) 0.125 MG tablet Take 0.125 mg by mouth daily.   Yes Historical Provider, MD  dimenhyDRINATE (DRAMAMINE) 50 MG tablet Take 50 mg by mouth every 8 (eight) hours as needed (for nausea).   Yes Historical Provider, MD  famotidine (PEPCID) 20 MG tablet Take 20 mg by mouth daily.     Yes Historical Provider, MD  sodium chloride (OCEAN) 0.65 % nasal spray Place 1 spray into the nose as needed for congestion.   Yes Historical Provider, MD   Physical Exam: Filed Vitals:   09/12/12 2040 09/12/12 2100 09/12/12 2105 09/12/12 2141  BP: 243/97 180/102 180/102 208/92  Pulse: 112 73  73  Temp:      TempSrc:      Resp:  14 16 13   SpO2:  98% 97% 99%     General:  Well-developed and nourished.  Eyes: Anicteric no pallor no nystagmus.  ENT: No facial asymmetry. Tongue is midline.  Neck: No mass felt.  Cardiovascular: S1-S2 heard.  Respiratory: No rhonchi or crepitations.  Abdomen: Soft nontender bowel sounds present.  Skin:  No rash.  Musculoskeletal: No edema.  Psychiatric: Appears normal.  Neurologic: Alert awake oriented to time place and person. Moves all extremities 5 x 5 on trying to make patient walk she is unable to because of imbalance.  Labs on Admission:  Basic Metabolic Panel:  Recent Labs Lab 09/12/12 1900  NA 136  K 4.1  CL 103  GLUCOSE 124*  BUN 14  CREATININE 0.80   Liver Function Tests: No results found for this basename: AST, ALT, ALKPHOS, BILITOT, PROT, ALBUMIN,  in the last 168 hours No results found for this basename: LIPASE, AMYLASE,  in the last 168 hours No results found for this basename: AMMONIA,  in the last 168 hours CBC:  Recent Labs Lab 09/12/12 1826 09/12/12 1900   WBC 12.8*  --   NEUTROABS 9.2*  --   HGB 13.9 14.3  HCT 39.6 42.0  MCV 90.8  --   PLT 230  --    Cardiac Enzymes: No results found for this basename: CKTOTAL, CKMB, CKMBINDEX, TROPONINI,  in the last 168 hours  BNP (last 3 results) No results found for this basename: PROBNP,  in the last 8760 hours CBG: No results found for this basename: GLUCAP,  in the last 168 hours  Radiological Exams on Admission: Dg Chest 2 View  09/12/2012  *RADIOLOGY  REPORT*  Clinical Data: Chest pain, hypertension, mitral valve prolapse  CHEST - 2 VIEW  Comparison: 09/24/2009; 04/15/2009; chest CT - 09/24/2009  Findings:  Grossly unchanged cardiac silhouette and mediastinal contours with mild tortuosity of the thoracic aorta.  The lungs remain hyperexpanded with flattening of the bilateral hemidiaphragms.  No focal airspace opacity.  No pleural effusion or pneumothorax.  No definite evidence of edema.  Unchanged bones.  IMPRESSION: Hyperexpanded lungs without acute cardiopulmonary disease.   Original Report Authenticated By: Tacey Ruiz, MD    Ct Head Wo Contrast  09/12/2012  *RADIOLOGY REPORT*  Clinical Data: Near-syncope  CT HEAD WITHOUT CONTRAST  Technique:  Contiguous axial images were obtained from the base of the skull through the vertex without contrast.  Comparison: 09/13/2003  Findings: Mild global atrophy appropriate to age.  Minimal chronic ischemic changes in the periventricular white matter.  No mass effect, midline shift, or acute intracranial hemorrhage.  Mastoid air cells and visualized paranasal sinuses are clear.  Cranium is intact.  IMPRESSION: No acute intracranial pathology.   Original Report Authenticated By: Jolaine Click, M.D.      Assessment/Plan Principal Problem:   Gait instability Active Problems:   Spinal stenosis of lumbar region   Headache   Hypertension, accelerated   1. Difficulty walking with headache - at this time I have consulted neurologist for further recommendations.  Placed patient on neuro checks. Patient is to get lumbar puncture through fluoroscopy. 2. Accelerated hypertension - as per patient and daughter patient has very labile blood pressure and is sensitive to antihypertensives. At this time we will closely monitor in step down and has not ordered any antihypertensives for now. Patient is uncomfortable to receive any antihypertensives. 3. History of mitral valve prolapse. 4. History of hyperlipidemia.  5. History of spinal stenosis.    Code Status: Full code.  Family Communication: Patient's daughter.  Disposition Plan: Admit to inpatient.    KAKRAKANDY,ARSHAD N. [facility] Pager [phone].  If 7PM-7AM, please contact night-coverage [email or web address] Password [facility] 09/12/2012, 10:12 PM

## 2012-09-12 NOTE — ED Notes (Signed)
Pt reports to the ED for eval of near syncope. Pt reports that around 3pm today she was taking a nap and upon awakening she became nauseated, generalized weakness, loss of balance, and lightheadedness. Pt also reports that she felt like something popped in her head. Pt also complained of a gradual onset HA that has resolved at this time. Pt reports this happened to her a second time upon standing again. Pt had one episode of emesis. Pt denies any coffee ground or gross blood in her emesis. Pt denies any CP or SOB. CBG 112 mg/dl. Pt reports that she has been having episodes where she loses balance but does not fall for the last 12 months. Pt A&O x4. Pt hypertensive at 224/92 mmHg at this time.

## 2012-09-12 NOTE — ED Provider Notes (Signed)
History     CSN: [id number]  Arrival date & time 09/12/12  1741   First MD Initiated Contact with Patient 09/12/12 1746      Chief Complaint  Patient presents with  . Near Syncope    (Consider location/radiation/quality/duration/timing/severity/associated sxs/prior treatment) HPI Comments: This is an 79 year old female, past medical history remarkable for hypertension, hyperlipidemia, mitral valve prolapse, vertigo, dizziness, who presents emergency department with chief complaint of near syncopal episode. Patient reports a several episodes of near syncope today. She states that she was sitting in her recliner taking a nap, when she realized she needed to use the restroom, and she stood up suddenly, and felt lightheaded. She sat back down and waited for several minutes, and then was able to stand and walk to the bathroom. After using the bathroom, she had another episode of near syncope, and went and laid down on her bed. She reports having a mild headache at the time, but is not complaining of any headache or neck stiffness at this time. She denies fever, chills, nausea, vomiting, diarrhea, or constipation. She states that she has not felt dizzy or like the room is spinning. She denies any pain at this time.  The history is provided by the patient. No language interpreter was used.    Past Medical History  Diagnosis Date  . Hypertension   . Atypical chest pain   . History of spinal stenosis     underwent back surgery on 12/03/09 by Dr. Trey Sailors  . Hyperlipidemia   . Vertigo     positional vertigo  . Dizziness   . Muscle spasm   . Numbness in feet   . Dyspepsia   . Fatigue   . Headaches, cluster   . Joint pain   . Worsening vision   . Shortness of breath   . Heart palpitations   . Nausea and/or vomiting     with walking  . Hypercholesterolemia     with inability to tolerate statin therapy  . Mitral valve prolapse   . History of liver disease     history of liver trouble,  has not had any known esposures to jaundiced person  . History of rheumatic fever     as a child  . Chest discomfort     long history of chest discomfort    Past Surgical History  Procedure Laterality Date  . Tonsillectomy    . Breast biopsy    . Bladder fulguration    . Cholecystectomy    . Total abdominal hysterectomy    . Hemorrhoid surgery    . Cervical fusion    . Carpal tunnel release      bilaterally  . Lumbar laminectomy      History reviewed. No pertinent family history.  History  Substance Use Topics  . Smoking status: Never Smoker   . Smokeless tobacco: Not on file  . Alcohol Use: No    OB History   Grav Para Term Preterm Abortions TAB SAB Ect Mult Living                  Review of Systems  All other systems reviewed and are negative.    Allergies  Amlodipine; Aspirin; Bystolic; Calan; Cephalosporins; Codeine; Cozaar; Elavil; Erythromycin; Gantrisin; Hctz; Iodides; Lopressor; Micardis; Morphine and related; Nizatidine; Nsaids; Pantoprazole sodium; Penicillins; Prilosec; Promethazine hcl; Streptomycin; Sulfa drugs cross reactors; Tequin; Tetracyclines & related; Toprol xl; Ultram; Valium; and Valturna  Home Medications   Current Outpatient Rx  Name  Route  Sig  Dispense  Refill  . beclomethasone (BECONASE AQ) 42 MCG/SPRAY nasal spray   Nasal   Place 2 sprays into the nose 2 (two) times daily. Dose is for each nostril.   25 g   1   . BECONASE AQ 42 MCG/SPRAY nasal spray      INHALE 2 PUFFS TWICE DAILY AS NEEDED   25 g   1     CYCLE FILL MEDICATION. Authorization is required f ...   . cetirizine (ZYRTEC) 10 MG tablet   Oral   Take 10 mg by mouth daily. As needed         . digoxin (LANOXIN) 0.125 MG tablet      TAKE 1 TABLET (125 MCG TOTAL) BY MOUTH DAILY.   90 tablet   2     CYCLE FILL MEDICATION. Authorization is required f ...   . digoxin (LANOXIN) 0.125 MG tablet      TAKE 1 TABLET (125 MCG TOTAL) BY MOUTH DAILY.   90 tablet    3     CYCLE FILL MEDICATION. Authorization is required f ...   . DimenhyDRINATE (DRAMAMINE PO)   Oral   Take 1 tablet by mouth daily. As needed         . famotidine (PEPCID) 20 MG tablet   Oral   Take 20 mg by mouth daily.           . Ondansetron HCl (ZOFRAN PO)   Oral   Take 1 tablet by mouth as needed.           . Vitamin D, Ergocalciferol, (DRISDOL) 50000 UNITS CAPS      TAKE 1 CAPSULE (50,000 UNITS TOTAL) BY MOUTH ONCE A WEEK.   4 capsule   11     Rx has expired - unused refills remain     BP 224/92  Pulse 91  Temp(Src) 97.9 F (36.6 C) (Oral)  Resp 14  SpO2 100%  Physical Exam  Nursing note and vitals reviewed. Constitutional: She is oriented to person, place, and time. She appears well-developed and well-nourished.  HENT:  Head: Normocephalic and atraumatic.  Eyes: Conjunctivae and EOM are normal. Pupils are equal, round, and reactive to light.  Neck: Normal range of motion. Neck supple.  Cardiovascular: Normal rate and regular rhythm.  Exam reveals no gallop and no friction rub.   No murmur heard. Pulmonary/Chest: Effort normal and breath sounds normal. No respiratory distress. She has no wheezes. She has no rales. She exhibits no tenderness.  Abdominal: Soft. Bowel sounds are normal. She exhibits no distension and no mass. There is no tenderness. There is no rebound and no guarding.  Musculoskeletal: Normal range of motion. She exhibits no edema and no tenderness.  Neurological: She is alert and oriented to person, place, and time.  CN 3-12 intact, no pronator drift, sensation and strength intact bilaterally, upper and lower extremities.  Skin: Skin is warm and dry.  Psychiatric: She has a normal mood and affect. Her behavior is normal. Judgment and thought content normal.    ED Course  Procedures (including critical care time)  Labs Reviewed  CBC WITH DIFFERENTIAL  URINALYSIS, ROUTINE W REFLEX MICROSCOPIC   Results for orders placed during the  hospital encounter of 09/12/12  CBC WITH DIFFERENTIAL      Result Value Range   WBC 12.8 (*) 4.0 - 10.5 K/uL   RBC 4.36  3.87 - 5.11 MIL/uL   Hemoglobin 13.9  12.0 -  15.0 g/dL   HCT 16.1  09.6 - 04.5 %   MCV 90.8  78.0 - 100.0 fL   MCH 31.9  26.0 - 34.0 pg   MCHC 35.1  30.0 - 36.0 g/dL   RDW 40.9  81.1 - 91.4 %   Platelets 230  150 - 400 K/uL   Neutrophils Relative 72  43 - 77 %   Neutro Abs 9.2 (*) 1.7 - 7.7 K/uL   Lymphocytes Relative 20  12 - 46 %   Lymphs Abs 2.5  0.7 - 4.0 K/uL   Monocytes Relative 5  3 - 12 %   Monocytes Absolute 0.7  0.1 - 1.0 K/uL   Eosinophils Relative 2  0 - 5 %   Eosinophils Absolute 0.3  0.0 - 0.7 K/uL   Basophils Relative 0  0 - 1 %   Basophils Absolute 0.1  0.0 - 0.1 K/uL  URINALYSIS, ROUTINE W REFLEX MICROSCOPIC      Result Value Range   Color, Urine YELLOW  YELLOW   APPearance CLEAR  CLEAR   Specific Gravity, Urine 1.007  1.005 - 1.030   pH 8.0  5.0 - 8.0   Glucose, UA NEGATIVE  NEGATIVE mg/dL   Hgb urine dipstick NEGATIVE  NEGATIVE   Bilirubin Urine NEGATIVE  NEGATIVE   Ketones, ur NEGATIVE  NEGATIVE mg/dL   Protein, ur NEGATIVE  NEGATIVE mg/dL   Urobilinogen, UA 0.2  0.0 - 1.0 mg/dL   Nitrite NEGATIVE  NEGATIVE   Leukocytes, UA SMALL (*) NEGATIVE  URINE MICROSCOPIC-ADD ON      Result Value Range   Squamous Epithelial / LPF FEW (*) RARE   WBC, UA 3-6  <3 WBC/hpf   Bacteria, UA FEW (*) RARE  POCT I-STAT, CHEM 8      Result Value Range   Sodium 136  135 - 145 mEq/L   Potassium 4.1  3.5 - 5.1 mEq/L   Chloride 103  96 - 112 mEq/L   BUN 14  6 - 23 mg/dL   Creatinine, Ser 7.82  0.50 - 1.10 mg/dL   Glucose, Bld 956 (*) 70 - 99 mg/dL   Calcium, Ion 2.13  0.86 - 1.30 mmol/L   TCO2 23  0 - 100 mmol/L   Hemoglobin 14.3  12.0 - 15.0 g/dL   HCT 57.8  46.9 - 62.9 %  POCT I-STAT TROPONIN I      Result Value Range   Troponin i, poc 0.00  0.00 - 0.08 ng/mL   Comment 3            Dg Chest 2 View  09/12/2012  *RADIOLOGY REPORT*  Clinical Data:  Chest pain, hypertension, mitral valve prolapse  CHEST - 2 VIEW  Comparison: 09/24/2009; 04/15/2009; chest CT - 09/24/2009  Findings:  Grossly unchanged cardiac silhouette and mediastinal contours with mild tortuosity of the thoracic aorta.  The lungs remain hyperexpanded with flattening of the bilateral hemidiaphragms.  No focal airspace opacity.  No pleural effusion or pneumothorax.  No definite evidence of edema.  Unchanged bones.  IMPRESSION: Hyperexpanded lungs without acute cardiopulmonary disease.   Original Report Authenticated By: Tacey Ruiz, MD    Ct Head Wo Contrast  09/12/2012  *RADIOLOGY REPORT*  Clinical Data: Near-syncope  CT HEAD WITHOUT CONTRAST  Technique:  Contiguous axial images were obtained from the base of the skull through the vertex without contrast.  Comparison: 09/13/2003  Findings: Mild global atrophy appropriate to age.  Minimal chronic ischemic changes in the  periventricular white matter.  No mass effect, midline shift, or acute intracranial hemorrhage.  Mastoid air cells and visualized paranasal sinuses are clear.  Cranium is intact.  IMPRESSION: No acute intracranial pathology.   Original Report Authenticated By: Jolaine Click, M.D.      ED ECG REPORT  I personally interpreted this EKG   Date: 09/12/2012   Rate: 88  Rhythm: normal sinus rhythm  QRS Axis: normal  Intervals: normal  ST/T Wave abnormalities: normal  Conduction Disutrbances:none  Narrative Interpretation:   Old EKG Reviewed: none available    1. Hypertension   2. Near syncope   3. Gait instability   4. Headache   5. Hypertension, accelerated       MDM  79 year old female with near syncopal episode. Suspect orthostatic, , however will check basic labs, rule out anemia, EKG, chest x-ray.   6:15 PM According to the patient's records and affect, she has frequent episodes of profound lightheadedness.  This patient has been seen by and discussed with Dr. Manus Gunning.  Patient has uncontrolled  hypertension.  She normally runs in the 190-210s/90s.  She does have a concerning point in her history regarding feeling a pop in her head.  She did not include this in my history, but did when Dr. Manus Gunning evaluated her.  We fill that it would be best to admit the patient.  She is currently headache free and neurovascularly intact, but she is unable to stand/walk.  She is also tachycardic with standing.       Roxy Horseman, PA-C 09/12/12 2329

## 2012-09-13 ENCOUNTER — Inpatient Hospital Stay (HOSPITAL_COMMUNITY): Payer: Medicare Other

## 2012-09-13 DIAGNOSIS — S0990XA Unspecified injury of head, initial encounter: Secondary | ICD-10-CM | POA: Diagnosis not present

## 2012-09-13 DIAGNOSIS — R262 Difficulty in walking, not elsewhere classified: Secondary | ICD-10-CM | POA: Diagnosis not present

## 2012-09-13 DIAGNOSIS — R51 Headache: Secondary | ICD-10-CM | POA: Diagnosis not present

## 2012-09-13 DIAGNOSIS — I10 Essential (primary) hypertension: Secondary | ICD-10-CM | POA: Diagnosis not present

## 2012-09-13 DIAGNOSIS — I951 Orthostatic hypotension: Secondary | ICD-10-CM | POA: Diagnosis present

## 2012-09-13 LAB — CBC WITH DIFFERENTIAL/PLATELET
Basophils Relative: 0 % (ref 0–1)
HCT: 39.3 % (ref 36.0–46.0)
Hemoglobin: 13.5 g/dL (ref 12.0–15.0)
MCHC: 34.4 g/dL (ref 30.0–36.0)
Monocytes Absolute: 1 10*3/uL (ref 0.1–1.0)
Monocytes Relative: 7 % (ref 3–12)
Neutro Abs: 7 10*3/uL (ref 1.7–7.7)

## 2012-09-13 LAB — LIPID PANEL
HDL: 38 mg/dL — ABNORMAL LOW (ref >39)
LDL Cholesterol: 157 mg/dL — ABNORMAL HIGH (ref 0–99)
Total CHOL/HDL Ratio: 6 RATIO
Triglycerides: 168 mg/dL — ABNORMAL HIGH (ref <150)
VLDL: 34 mg/dL (ref 0–40)

## 2012-09-13 LAB — COMPREHENSIVE METABOLIC PANEL
ALT: 20 U/L (ref 0–35)
AST: 22 U/L (ref 0–37)
Calcium: 9.1 mg/dL (ref 8.4–10.5)
GFR calc Af Amer: 85 mL/min — ABNORMAL LOW (ref >90)
Sodium: 137 mEq/L (ref 135–145)
Total Protein: 6.1 g/dL (ref 6.0–8.3)

## 2012-09-13 LAB — GLUCOSE, CAPILLARY: Glucose-Capillary: 122 mg/dL — ABNORMAL HIGH (ref 70–99)

## 2012-09-13 MED ORDER — IOHEXOL 350 MG/ML SOLN
50.0000 mL | Freq: Once | INTRAVENOUS | Status: AC | PRN
  Administered 2012-09-13: 50 mL via INTRAVENOUS

## 2012-09-13 MED ORDER — FAMOTIDINE 20 MG PO TABS
20.0000 mg | ORAL_TABLET | Freq: Once | ORAL | Status: AC
  Administered 2012-09-14: 20 mg via ORAL
  Filled 2012-09-13: qty 1

## 2012-09-13 MED ORDER — SODIUM CHLORIDE 0.9 % IV SOLN
INTRAVENOUS | Status: DC
  Administered 2012-09-13 – 2012-09-14 (×2): via INTRAVENOUS

## 2012-09-13 MED ORDER — CLONIDINE HCL 0.1 MG PO TABS
0.1000 mg | ORAL_TABLET | Freq: Three times a day (TID) | ORAL | Status: DC
  Administered 2012-09-13: 0.1 mg via ORAL
  Filled 2012-09-13 (×3): qty 1

## 2012-09-13 MED ORDER — SIMETHICONE 80 MG PO CHEW
160.0000 mg | CHEWABLE_TABLET | Freq: Once | ORAL | Status: DC
  Filled 2012-09-13: qty 2

## 2012-09-13 MED ORDER — ALUM & MAG HYDROXIDE-SIMETH 200-200-20 MG/5ML PO SUSP: 15.0000 mL | Freq: Four times a day (QID) | ORAL | Status: DC | PRN

## 2012-09-13 MED ORDER — CLONIDINE HCL 0.1 MG PO TABS
0.1000 mg | ORAL_TABLET | Freq: Three times a day (TID) | ORAL | Status: DC
  Administered 2012-09-13: 0.1 mg via ORAL
  Filled 2012-09-13 (×4): qty 1

## 2012-09-13 NOTE — Progress Notes (Signed)
NEURO HOSPITALIST PROGRESS NOTE   SUBJECTIVE:                                                                                                                        Patient feels fine when laying down but continues to tell me her legs start to shake when she stands up and she gets blurred vision.   OBJECTIVE:                                                                                                                           Vital signs in last 24 hours: Temp:  [97.9 F (36.6 C)-98.4 F (36.9 C)] 98.4 F (36.9 C) (04/02 0800) Pulse Rate:  [67-112] 77 (04/02 0835) Resp:  [13-19] 16 (04/02 0835) BP: (156-243)/(76-104) 165/84 mmHg (04/02 0835) SpO2:  [95 %-100 %] 98 % (04/02 0835) Weight:  [52.5 kg (115 lb 11.9 oz)] 52.5 kg (115 lb 11.9 oz) (04/02 0800)  Intake/Output from previous day: 04/01 0701 - 04/02 0700 In: -  Out: 200 [Urine:200] Intake/Output this shift:   Nutritional status: Cardiac  Past Medical History  Diagnosis Date  . Hypertension   . Atypical chest pain   . History of spinal stenosis     underwent back surgery on 12/03/09 by Dr. Trey Sailors  . Hyperlipidemia   . Vertigo     positional vertigo  . Dizziness   . Muscle spasm   . Numbness in feet   . Dyspepsia   . Fatigue   . Headaches, cluster   . Joint pain   . Worsening vision   . Shortness of breath   . Heart palpitations   . Nausea and/or vomiting     with walking  . Hypercholesterolemia     with inability to tolerate statin therapy  . Mitral valve prolapse   . History of liver disease     history of liver trouble, has not had any known esposures to jaundiced person  . History of rheumatic fever     as a child  . Chest discomfort     long history of chest discomfort    Neurologic Exam:  Mental Status:  Alert, oriented, thought content appropriate. Speech fluent without evidence of aphasia. Able  to follow 3 step commands without difficulty.  Cranial  Nerves:  II: Discs flat bilaterally; Visual fields grossly normal, pupils equal, round, reactive to light and accommodation  III,IV, VI: ptosis not present, extra-ocular motions intact bilaterally  V,VII: smile symmetric, facial light touch sensation normal bilaterally  VIII: hearing normal bilaterally  IX,X: gag reflex present  XI: bilateral shoulder shrug  XII: midline tongue extension  Motor:  Right :   Upper extremity 5/5   Left: Upper extremity 5/5    Lower extremity 4-/5   Lower extremity 4-/5  --patient does not give me full effort in her lower extremities especially with DF/PF, stating her lags just start to shake.  Tone and bulk:normal tone throughout; no atrophy noted  Sensory: Pinprick and light touch intact throughout with decreased sensation in the feet bilaterally  Deep Tendon Reflexes: 2+ and symmetric  Plantars:  Right: downgoing   Left: downgoing  Cerebellar:  Finger-to-nose and heel-to-shin testing normal with again noted end point tremor CV: pulses palpable throughout    Lab Results: Lab Results  Component Value Date/Time   CHOL 229* 09/13/2012  4:10 AM   Lipid Panel  Recent Labs  09/13/12 0410  CHOL 229*  TRIG 168*  HDL 38*  CHOLHDL 6.0  VLDL 34  LDLCALC 161*    Studies/Results: Ct Angio Head W/cm &/or Wo Cm  09/13/2012  *RADIOLOGY REPORT*  Clinical Data:  Multiple near falls, gait difficulty.  CT ANGIOGRAPHY HEAD  Technique:  Multidetector CT imaging of the head was performed using the standard protocol during bolus administration of intravenous contrast.  Multiplanar CT image reconstructions including MIPs were obtained to evaluate the vascular anatomy.  Contrast: 50mL OMNIPAQUE IOHEXOL 350 MG/ML SOLN  Comparison:  CT head 09/12/2012.  Findings:  Bilateral cavernous and supraclinoid internal carotid artery calcific atheromatous change results in symmetric estimated 50% stenoses, not clearly flow reducing.  Both vertebral arteries are widely patent and  codominant.  The basilar artery is also widely patent.  Right ACA A1 segment shows diffuse narrowing estimated to be 50% stenosis.  Left A1 ACA unremarkable.  Mild nonstenotic irregularity right MCA M1 segment.  Left MCA M1 segment shows 50% stenosis.  Both posterior cerebral arteries show proximal and distal irregularities, some which appear flow limiting, consistent with intracranial atherosclerotic change. This may represent manifestation of hypertensive cerebrovascular disease.  Similar segmental irregularity of the distal ACA and MCA branches is noted, also likely intracranial atherosclerotic change.  No visible cerebellar branch occlusion.  No intracranial aneurysm. No signs of venous sinus thrombosis.  No osseous abnormality.  Post infusion images demonstrate no abnormal intracranial enhancement. There are no signs of cerebral infarction or hemorrhage.  Redemonstrated is atrophy with chronic microvascular ischemic change.   Review of the MIP images confirms the above findings.  IMPRESSION: No proximal flow limiting stenosis of the internal carotid arteries, vertebral arteries, or basilar artery is identified.  Segmental irregularities of the distal ACA, MCA, as well as the proximal and distal PCA branches suggesting intracranial atherosclerotic change.  Vasculitis would be another consideration. If clinically indicated, formal catheter angiogram could be helpful in further evaluation.   Original Report Authenticated By: Davonna Belling, M.D.    Dg Chest 2 View  09/12/2012  *RADIOLOGY REPORT*  Clinical Data: Chest pain, hypertension, mitral valve prolapse  CHEST - 2 VIEW  Comparison: 09/24/2009; 04/15/2009; chest CT - 09/24/2009  Findings:  Grossly unchanged cardiac silhouette and mediastinal contours with mild tortuosity of the thoracic aorta.  The lungs  remain hyperexpanded with flattening of the bilateral hemidiaphragms.  No focal airspace opacity.  No pleural effusion or pneumothorax.  No definite evidence  of edema.  Unchanged bones.  IMPRESSION: Hyperexpanded lungs without acute cardiopulmonary disease.   Original Report Authenticated By: Tacey Ruiz, MD    Ct Head Wo Contrast  09/12/2012  *RADIOLOGY REPORT*  Clinical Data: Near-syncope  CT HEAD WITHOUT CONTRAST  Technique:  Contiguous axial images were obtained from the base of the skull through the vertex without contrast.  Comparison: 09/13/2003  Findings: Mild global atrophy appropriate to age.  Minimal chronic ischemic changes in the periventricular white matter.  No mass effect, midline shift, or acute intracranial hemorrhage.  Mastoid air cells and visualized paranasal sinuses are clear.  Cranium is intact.  IMPRESSION: No acute intracranial pathology.   Original Report Authenticated By: Jolaine Click, M.D.     MEDICATIONS                                                                                                                        Scheduled: . digoxin  0.125 mg Oral Daily  . famotidine  20 mg Oral Daily  . loratadine  10 mg Oral Daily   Orthostatics: BP was noted to increase when going from laying to sitting to standing.  ASSESSMENT/PLAN:                                                                                                            79 YO female with known gait instability and BPV. At present time having no complaints but feels when she stands up she feels dizzy, blurred vision and legs shaking.  All resolved when sits back down. CTA head showed no rate limiting flow of ICA, vertebral arteries or basilar artery.  MRI Pending.    Recommend: 1) PT 2) MRI brain 3) BP control  Will follow up MRI.    Assessment and plan discussed with with attending physician and they are in agreement.    Felicie Morn PA-C [facility] [phone]  09/13/2012, 9:46 AM

## 2012-09-13 NOTE — ED Provider Notes (Signed)
Medical screening examination/treatment/procedure(s) were conducted as a shared visit with non-physician practitioner(s) and myself.  I personally evaluated the patient during the encounter  Awoke from nap with headache, generalized weakness and near syncope.  May have heard "pop" upon wakening but isn't sure. Headache progressed gradually. No chest pain, SOB, vertigo. No dizziness.  Has had several episodes of near syncope and near falls. Uncontrolled HTN. nonfocal neuro exam, no meningismus. Unable to stand or walk. CT head neg. Discussed concern for [facility] given HTN and "pop".  Patient with previous back surgeries and spinal stenosis and refuses bedside attempt. D/w Dr Bonnielee Haff who will plan LP under fluro. Patient and daughter are not excited about LP but are agreeable.  Glynn Octave, MD 09/13/12 [phone]

## 2012-09-13 NOTE — Progress Notes (Signed)
Utilization review completed.  

## 2012-09-13 NOTE — Evaluation (Signed)
Physical Therapy Evaluation Patient Details Name: [name] MRN: [id number] DOB: [birth year] Today's Date: 09/13/2012 Time: 4098-1191 PT Time Calculation (min): 27 min  PT Assessment / Plan / Recommendation Clinical Impression  79 y.o. female admitted to [facility] with near sycopal episode, sharp headache, nausea and inabilit to walk.  She has had stroke workup and so far negative for stroke.  She presents today with generalized weakness and limited activity tolerance especially of standing.  In both sitting and standing the pt reports feeling lightheaded and weak.  She lives by herself and has a daughter who calls to check on her daily.  She has a life alert button, but I am concerned that she needs more supervision for now until this feeling of lightheadedness clears. If her daughter can stay with her 24 hours then I would recommend HHPT at discharge.      PT Assessment  Patient needs continued PT services    Follow Up Recommendations  Home health PT;Supervision/Assistance - 24 hour    Does the patient have the potential to tolerate intense rehabilitation     yes  Barriers to Discharge Decreased caregiver support lives alone    Equipment Recommendations  None recommended by PT    Recommendations for Other Services OT consult   Frequency Min 3X/week    Precautions / Restrictions Precautions Precautions: Fall   Pertinent Vitals/Pain BP: supine: 189/87        Sitting: 183/83        Standing: 162/115     Mobility  Bed Mobility Bed Mobility: Supine to Sit;Sitting - Scoot to Edge of Bed;Sit to Supine Supine to Sit: 5: Supervision;HOB elevated;With rails Sitting - Scoot to Edge of Bed: 5: Supervision;With rail Sit to Supine: 5: Supervision;HOB elevated;With rail Details for Bed Mobility Assistance: supervision for safety due to pt reports that she gets lightheaded and faint when sitting up Transfers Transfers: Sit to Stand;Stand to Sit;Stand Pivot Transfers Sit to Stand: 4:  Min assist;With upper extremity assist;From bed;With armrests;From chair/3-in-1 Stand to Sit: 4: Min assist;With upper extremity assist;With armrests;To chair/3-in-1;To bed Stand Pivot Transfers: 4: Min assist;With armrests Details for Transfer Assistance: min assist to support trunk to get to standing.  Heavy reliance on upper extremity to get to stand, transfer and sit.  RW used for transfer.   Ambulation/Gait Ambulation/Gait Assistance: Not tested (comment) (pt declined, did not feel up to walking.  )        PT Diagnosis: Difficulty walking;Abnormality of gait;Generalized weakness;Acute pain  PT Problem List: Decreased strength;Decreased activity tolerance;Decreased mobility;Decreased balance;Cardiopulmonary status limiting activity;Pain PT Treatment Interventions: Gait training;DME instruction;Functional mobility training;Therapeutic activities;Balance training;Therapeutic exercise;Neuromuscular re-education;Patient/family education   PT Goals Acute Rehab PT Goals PT Goal Formulation: With patient Time For Goal Achievement: 09/27/12 Potential to Achieve Goals: Good Pt will go Supine/Side to Sit: Independently;with HOB 0 degrees PT Goal: Supine/Side to Sit - Progress: Goal set today Pt will go Sit to Supine/Side: Independently;with HOB 0 degrees PT Goal: Sit to Supine/Side - Progress: Goal set today Pt will go Sit to Stand: with modified independence;with upper extremity assist PT Goal: Sit to Stand - Progress: Goal set today Pt will go Stand to Sit: with modified independence;with upper extremity assist PT Goal: Stand to Sit - Progress: Goal set today Pt will Transfer Bed to Chair/Chair to Bed: with modified independence PT Transfer Goal: Bed to Chair/Chair to Bed - Progress: Goal set today Pt will Ambulate: 51 - 150 feet;with rolling walker;with modified independence PT Goal: Ambulate -  Progress: Goal set today  Visit Information  Last PT Received On: 09/13/12 Assistance Needed:  +1    Subjective Data      Prior Functioning  Home Living Lives With: Alone;Other (Comment) (with a dog) Available Help at Discharge: Family;Available PRN/intermittently Type of Home: House Home Access: Ramped entrance Home Layout: One level Bathroom Shower/Tub: Tub/shower unit;Curtain Firefighter: Standard Bathroom Accessibility: No Home Adaptive Equipment: Bedside commode/3-in-1;Tub transfer bench;Walker - rolling Prior Function Level of Independence: Independent with assistive device(s) Driving: No Communication Communication: No difficulties Dominant Hand: Right    Cognition  Cognition Overall Cognitive Status: Impaired Area of Impairment: Memory Arousal/Alertness: Awake/alert Orientation Level: Oriented X4 / Intact Memory Deficits: self reported memory issues    Extremity/Trunk Assessment Right Upper Extremity Assessment RUE Sensation: Deficits RUE Sensation Deficits: reports hitory of numbness and tingling in all 4 extremities.  None currently Left Upper Extremity Assessment LUE Sensation: Deficits LUE Sensation Deficits: reports hitory of numbness and tingling in all 4 extremities.  None currently Right Lower Extremity Assessment RLE ROM/Strength/Tone: Deficits RLE ROM/Strength/Tone Deficits: grossly 3/5 per gross functional assessment RLE Sensation: Deficits RLE Sensation Deficits: reports hitory of numbness and tingling in all 4 extremities.  None currently Left Lower Extremity Assessment LLE ROM/Strength/Tone: Deficits LLE ROM/Strength/Tone Deficits: grossly 3/5 per functional assessment LLE Sensation: Deficits LLE Sensation Deficits: reports hitory of numbness and tingling in all 4 extremities.  None currently   Balance Static Sitting Balance Static Sitting - Balance Support: Bilateral upper extremity supported;Feet supported Static Sitting - Level of Assistance: 6: Modified independent (Device/Increase time) Static Sitting - Comment/# of Minutes: sat  EOB 10 mins checking BP, increasing sitting tolerance.  Static Standing Balance Static Standing - Balance Support: Bilateral upper extremity supported Static Standing - Level of Assistance: 4: Min assist Static Standing - Comment/# of Minutes: min assist with RW, pt weak in the knees and lightheaded.  Orthostatic vitals checked and pt's BP increased   End of Session PT - End of Session Activity Tolerance: Patient limited by fatigue;Treatment limited secondary to medical complications (Comment) (limited by feeling lightheaded) Patient left: in bed;with call bell/phone within reach       Seneca B. Adamarys Shall, PT, DPT [phone]   09/13/2012, 12:43 PM

## 2012-09-13 NOTE — Progress Notes (Signed)
[facility] Progress Note [place] TEAM 1 - Stepdown/ICU TEAM   [name] VHQ:[id number] DOB: 1921/01/22 DOA: 09/12/2012 PCP: No primary provider on file.  Brief narrative: 79 year old female patient with history of labile hypertension who has had many untoward reactions to various antihypertensive agents therefore does not take anything for blood pressure. She presented to the emergency department because of difficulty walking. The symptoms began at 3 PM on the date of admission and started as a severe headache which after he started off as a popping sound in her head. Following this the patient found it difficult to walk. She apparently had some mild confusion but did not lose consciousness. Because her symptoms persisted she presented to the emergency department. By the time she arrived her headache had resolved she was still having difficulty walking. She denied any visual disturbance or numbness or weakness in any of the extremities that would be consistent with a TIA/CVA. CT of the head was negative. Patient's blood pressure was quite high in the emergency department and the daughter confirm that the patient's blood pressure usually runs very high with systolics in the 180s to 190s and diastolic in the 90s but again because she is very sensitive antihypertensive medications therefore she does not take anti-HTN medications. The patient is very independent and lives alone with her dog.  Assessment/Plan: Active Problems:   Benign hypertensive heart disease without heart failure/Hypertension, accelerated -complex, chronic situation -prior sensitivity to beta blockers, ARB's and HCTZ so will try low dose clonidine and monitor closely    ? Orthostatic hypotension -h/o dehydration in past which contributed to sx's of weakness and gait disturbance -check OVS- may need IVF -initial OVS by PT w/ 20 mm drop in SBP and 20 mm increase in DBP-repeat OVS with 20 mm drop in SBP and stable DBP  so will consider IVF    History of vertigo -currently no vertigo sx's endorsed    Gait instability/Spinal stenosis of lumbar region -prior surgery but no back pain endorsed so doubt current sx's from spinal stenosis -CT Head and CTA head negative -MRI Brain pending -appreciate Neuro assistance -pt endorsed sx's have been progressive over one year and have been increasing in frequency    Headache -resolved      Hypercholesterolemia   DVT prophylaxis: SCDs Code Status: Full Family Communication: Patient noting daughter not in room at time of our encounter Disposition Plan: Stepdown Isolation: None  Consultants: Neurology Cardiology notified of admission  Procedures: None  Antibiotics: None  HPI/Subjective: Patient endorsed still having these unusual symptoms of weak and feeling lightheaded but emphatically insists is not dizziness, endorsed these have been progressive and worsening over the past one year. Has not fallen but states she usually is close enough to a chair when the symptoms occur to set herself down quickly. Currently no shortness of breath or chest pain.   Objective: Blood pressure 166/83, pulse 73, temperature 98 F (36.7 C), temperature source Oral, resp. rate 22, height 5\' 3"  (1.6 m), weight 52.5 kg (115 lb 11.9 oz), SpO2 97.00%.  Intake/Output Summary (Last 24 hours) at 09/13/12 1417 Last data filed at 09/13/12 0120  Gross per 24 hour  Intake      0 ml  Output    200 ml  Net   -200 ml     Exam: General: No acute respiratory distress Lungs: Clear to auscultation bilaterally without wheezes or crackles, RA Cardiovascular: Regular rate and rhythm without murmur gallop or rub normal S1 and S2,  no peripheral edema or JVD Abdomen: Nontender, nondistended, soft, bowel sounds, both systolic and diastolic blood pressure elevated today positive, no rebound, no ascites, no appreciable mass Musculoskeletal: No significant cyanosis, clubbing of bilateral  lower extremities Neurological: Alert and oriented x 3, moves all extremities x 4 without focal neurological deficits, CN 2-13 intact  Data Reviewed: Basic Metabolic Panel:  Recent Labs Lab 09/12/12 1900 09/13/12 0410  NA 136 137  K 4.1 3.9  CL 103 102  CO2  --  21  GLUCOSE 124* 104*  BUN 14 14  CREATININE 0.80 0.73  CALCIUM  --  9.1   Liver Function Tests:  Recent Labs Lab 09/13/12 0410  AST 22  ALT 20  ALKPHOS 80  BILITOT 0.5  PROT 6.1  ALBUMIN 3.4*   No results found for this basename: LIPASE, AMYLASE,  in the last 168 hours No results found for this basename: AMMONIA,  in the last 168 hours CBC:  Recent Labs Lab 09/12/12 1826 09/12/12 1900 09/13/12 0410  WBC 12.8*  --  13.4*  NEUTROABS 9.2*  --  7.0  HGB 13.9 14.3 13.5  HCT 39.6 42.0 39.3  MCV 90.8  --  91.4  PLT 230  --  234   Cardiac Enzymes: No results found for this basename: CKTOTAL, CKMB, CKMBINDEX, TROPONINI,  in the last 168 hours BNP (last 3 results) No results found for this basename: PROBNP,  in the last 8760 hours CBG:  Recent Labs Lab 09/13/12 0718 09/13/12 1204  GLUCAP 103* 136*    Recent Results (from the past 240 hour(s))  MRSA PCR SCREENING     Status: None   Collection Time    09/13/12  2:25 AM      Result Value Range Status   MRSA by PCR NEGATIVE  NEGATIVE Final   Comment:            The GeneXpert MRSA Assay (FDA     approved for NASAL specimens     only), is one component of a     comprehensive MRSA colonization     surveillance program. It is not     intended to diagnose MRSA     infection nor to guide or     monitor treatment for     MRSA infections.     Studies:  Recent x-ray studies have been reviewed in detail by the Attending Physician  Scheduled Meds:  Reviewed in detail by the Attending Physician   Junious Silk, ANP [facility] Office  [phone] Pager [phone]  On-Call/Text Page:      [email or web address]      password [email or web address]  If 7PM-7AM,  please contact night-coverage [email or web address] Password [facility] 09/13/2012, 2:17 PM   LOS: 1 day   I have examined the patient, reviewed the chart and modified the above note which I agree with.   Esco Joslyn,MD [phone] 09/14/2012, 6:21 PM

## 2012-09-14 DIAGNOSIS — I951 Orthostatic hypotension: Secondary | ICD-10-CM | POA: Diagnosis not present

## 2012-09-14 DIAGNOSIS — I10 Essential (primary) hypertension: Secondary | ICD-10-CM | POA: Diagnosis not present

## 2012-09-14 DIAGNOSIS — R269 Unspecified abnormalities of gait and mobility: Secondary | ICD-10-CM | POA: Diagnosis not present

## 2012-09-14 DIAGNOSIS — Z8669 Personal history of other diseases of the nervous system and sense organs: Secondary | ICD-10-CM

## 2012-09-14 LAB — GLUCOSE, CAPILLARY: Glucose-Capillary: 102 mg/dL — ABNORMAL HIGH (ref 70–99)

## 2012-09-14 MED ORDER — SALINE SPRAY 0.65 % NA SOLN
1.0000 | NASAL | Status: DC | PRN
  Administered 2012-09-14 – 2012-09-15 (×2): 1 via NASAL
  Filled 2012-09-14 (×2): qty 44

## 2012-09-14 MED ORDER — CLONIDINE HCL 0.1 MG PO TABS
0.1000 mg | ORAL_TABLET | Freq: Two times a day (BID) | ORAL | Status: DC
  Administered 2012-09-14 – 2012-09-15 (×3): 0.1 mg via ORAL
  Filled 2012-09-14 (×4): qty 1

## 2012-09-14 NOTE — Progress Notes (Signed)
Report called to [facility], receiving RN on 2000.  Pt transferred to 2033 via wheelchair with all belongings. Pt's daughter at bedside.  Roselie Awkward, RN

## 2012-09-14 NOTE — Progress Notes (Signed)
[facility] Progress Note [place] TEAM 1 - Stepdown/ICU TEAM   [name]:[id number] DOB: 04/18/1921 DOA: 09/12/2012 PCP: No primary provider on file.  Brief narrative: 79 year old female patient with history of labile hypertension who has had many untoward reactions to various antihypertensive agents therefore does not take anything for blood pressure. She presented to the emergency department because of difficulty walking. The symptoms began at 3 PM on the date of admission and started as a severe headache which after he started off as a popping sound in her head. Following this the patient found it difficult to walk. She apparently had some mild confusion but did not lose consciousness. Because her symptoms persisted she presented to the emergency department. By the time she arrived her headache had resolved she was still having difficulty walking. She denied any visual disturbance or numbness or weakness in any of the extremities that would be consistent with a TIA/CVA. CT of the head was negative. Patient's blood pressure was quite high in the emergency department and the daughter confirm that the patient's blood pressure usually runs very high with systolics in the 180s to 190s and diastolic in the 90s but again because she is very sensitive antihypertensive medications therefore she does not take anti-HTN medications. The patient is very independent and lives alone with her dog.  Assessment/Plan: Active Problems:   Benign hypertensive heart disease without heart failure/Hypertension, accelerated -complex, chronic situation -prior sensitivity to beta blockers, ARB's and HCTZ so will try low dose clonidine and monitor closely -rapid decrease in BP so will trial BID dosing -d/w pt with consideration of determining BP parameters before dosing at home with focus on potential HS dosing to improve tolerance and avoid long acting meds -will likely need to remain IP for med  adjustments given prior history of intolerance after repeated doses    Orthostatic hypotension -h/o dehydration in past which contributed to sx's of weakness and gait disturbance -OVS positive- less sx's after hydrated at 100/hr - will decrease to 50/hr and follow    History of benign positional vertigo -currently minimal vertigo sx's endorsed which are at times near constant but chronic -does NOT want OT tx (Epley's)    Gait instability/Spinal stenosis of lumbar region -prior surgery but no back pain endorsed so doubt current sx's from spinal stenosis -CT Head and CTA head negative -MRI Brain pending -appreciate Neuro assistance -pt endorsed sx's have been progressive over one year and have been increasing in frequency    Headache -resolved      Hypercholesterolemia   DVT prophylaxis: SCDs Code Status: Full Family Communication: Patient  Disposition Plan: Telemetry Isolation: None  Consultants: Neurology Cardiology notified of admission  Procedures: None  Antibiotics: None  HPI/Subjective: Patient mainly c/o unusual psycho/hallucinatorynsx's after taking generic pepcid last night. No change in baseline BPV sx's. No new sx's.   Objective: Blood pressure 121/65, pulse 53, temperature 97.5 F (36.4 C), temperature source Oral, resp. rate 18, height 5\' 3"  (1.6 m), weight 52.5 kg (115 lb 11.9 oz), SpO2 100.00%.  Intake/Output Summary (Last 24 hours) at 09/14/12 1527 Last data filed at 09/14/12 1400  Gross per 24 hour  Intake 2480.84 ml  Output    250 ml  Net 2230.84 ml     Exam: General: No acute respiratory distress Lungs: Clear to auscultation bilaterally without wheezes or crackles, RA Cardiovascular: Regular rate and rhythm without murmur gallop or rub normal S1 and S2, no peripheral edema or JVD Abdomen: Nontender, nondistended, soft,  bowel sounds, both systolic and diastolic blood pressure elevated today positive, no rebound, no ascites, no appreciable  mass Musculoskeletal: No significant cyanosis, clubbing of bilateral lower extremities Neurological: Alert and oriented x 3, moves all extremities x 4 without focal neurological deficits, CN 2-13 intact  Data Reviewed: Basic Metabolic Panel:  Recent Labs Lab 09/12/12 1900 09/13/12 0410  NA 136 137  K 4.1 3.9  CL 103 102  CO2  --  21  GLUCOSE 124* 104*  BUN 14 14  CREATININE 0.80 0.73  CALCIUM  --  9.1   Liver Function Tests:  Recent Labs Lab 09/13/12 0410  AST 22  ALT 20  ALKPHOS 80  BILITOT 0.5  PROT 6.1  ALBUMIN 3.4*   No results found for this basename: LIPASE, AMYLASE,  in the last 168 hours No results found for this basename: AMMONIA,  in the last 168 hours CBC:  Recent Labs Lab 09/12/12 1826 09/12/12 1900 09/13/12 0410  WBC 12.8*  --  13.4*  NEUTROABS 9.2*  --  7.0  HGB 13.9 14.3 13.5  HCT 39.6 42.0 39.3  MCV 90.8  --  91.4  PLT 230  --  234   Cardiac Enzymes: No results found for this basename: CKTOTAL, CKMB, CKMBINDEX, TROPONINI,  in the last 168 hours BNP (last 3 results) No results found for this basename: PROBNP,  in the last 8760 hours CBG:  Recent Labs Lab 09/13/12 1204 09/13/12 1648 09/13/12 2140 09/14/12 0728 09/14/12 1203  GLUCAP 136* 121* 122* 102* 128*    Recent Results (from the past 240 hour(s))  URINE CULTURE     Status: None   Collection Time    09/12/12  8:13 PM      Result Value Range Status   Specimen Description URINE, CLEAN CATCH   Final   Special Requests NONE   Final   Culture  Setup Time 09/13/2012 03:05   Final   Colony Count 80,000 COLONIES/ML   Final   Culture GRAM NEGATIVE RODS   Final   Report Status PENDING   Incomplete  MRSA PCR SCREENING     Status: None   Collection Time    09/13/12  2:25 AM      Result Value Range Status   MRSA by PCR NEGATIVE  NEGATIVE Final   Comment:            The GeneXpert MRSA Assay (FDA     approved for NASAL specimens     only), is one component of a     comprehensive  MRSA colonization     surveillance program. It is not     intended to diagnose MRSA     infection nor to guide or     monitor treatment for     MRSA infections.     Studies:  Recent x-ray studies have been reviewed in detail by the Attending Physician  Scheduled Meds:  Reviewed in detail by the Attending Physician   Junious Silk, ANP [facility] Office  [phone] Pager [phone]  On-Call/Text Page:      [email or web address]      password [email or web address]  If 7PM-7AM, please contact night-coverage [email or web address] Password [facility] 09/14/2012, 3:27 PM   LOS: 2 days   I have examined the patient, reviewed the chart and modified the above note which I agree with.   Ronisha Herringshaw,MD [phone] 09/14/2012, 5:13 PM

## 2012-09-14 NOTE — Progress Notes (Signed)
NEURO HOSPITALIST PROGRESS NOTE   SUBJECTIVE:                                                                                                                         Patient has multiple complaints about indigestion and not receiving the correct "pill" but no complaints about dizziness or gait issue.  OBJECTIVE:                                                                                                                           Vital signs in last 24 hours: Temp:  [97.8 F (36.6 C)-98.3 F (36.8 C)] 97.8 F (36.6 C) (04/03 0700) Pulse Rate:  [56-105] 56 (04/03 0400) Resp:  [12-33] 19 (04/03 0400) BP: (116-189)/(60-115) 139/62 mmHg (04/03 0400) SpO2:  [92 %-100 %] 92 % (04/03 0400)  Intake/Output from previous day: 04/02 0701 - 04/03 0700 In: 1576.7 [I.V.:1576.7] Out: 400 [Urine:400] Intake/Output this shift:   Nutritional status: Cardiac  Past Medical History  Diagnosis Date  . Hypertension   . Atypical chest pain   . History of spinal stenosis     underwent back surgery on 12/03/09 by Dr. Trey Sailors  . Hyperlipidemia   . Vertigo     positional vertigo  . Dizziness   . Muscle spasm   . Numbness in feet   . Dyspepsia   . Fatigue   . Headaches, cluster   . Joint pain   . Worsening vision   . Shortness of breath   . Heart palpitations   . Nausea and/or vomiting     with walking  . Hypercholesterolemia     with inability to tolerate statin therapy  . Mitral valve prolapse   . History of liver disease     history of liver trouble, has not had any known esposures to jaundiced person  . History of rheumatic fever     as a child  . Chest discomfort     long history of chest discomfort     Neurologic Exam:   Mental Status: Alert, oriented, thought content appropriate.  Speech fluent without evidence of aphasia.  Able to follow 3 step commands without difficulty. Cranial Nerves: WU:[id number] fields grossly normal, pupils  equal, round, reactive  to light and accommodation III,IV, VI: ptosis not present, extra-ocular motions intact bilaterally V,VII: smile symmetric, facial light touch sensation normal bilaterally VIII: hearing normal bilaterally IX,X: gag reflex present XI: bilateral shoulder shrug XII: midline tongue extension Motor: Right : Upper extremity   5/5    Left:     Upper extremity   5/5  Lower extremity   4/5     Lower extremity   4/5 --again patient shows fairly good strength but limited effort with formal exam. No limb shaking today Tone and bulk:normal tone throughout; no atrophy noted Sensory: Pinprick and light touch intact throughout, bilaterally Deep Tendon Reflexes: 2+ and symmetric throughout UE 2+ brisk in LE --previous C-spine surgery for stenosis.  Plantars: Right: downgoing   Left: downgoing Cerebellar: normal finger-to-nose,  normal heel-to-shin test CV: pulses palpable throughout   Lab Results: Lab Results  Component Value Date/Time   CHOL 229* 09/13/2012  4:10 AM   Lipid Panel  Recent Labs  09/13/12 0410  CHOL 229*  TRIG 168*  HDL 38*  CHOLHDL 6.0  VLDL 34  LDLCALC 259*    Studies/Results: Ct Angio Head W/cm &/or Wo Cm  09/13/2012  *RADIOLOGY REPORT*  Clinical Data:  Multiple near falls, gait difficulty.  CT ANGIOGRAPHY HEAD  Technique:  Multidetector CT imaging of the head was performed using the standard protocol during bolus administration of intravenous contrast.  Multiplanar CT image reconstructions including MIPs were obtained to evaluate the vascular anatomy.  Contrast: 50mL OMNIPAQUE IOHEXOL 350 MG/ML SOLN  Comparison:  CT head 09/12/2012.  Findings:  Bilateral cavernous and supraclinoid internal carotid artery calcific atheromatous change results in symmetric estimated 50% stenoses, not clearly flow reducing.  Both vertebral arteries are widely patent and codominant.  The basilar artery is also widely patent.  Right ACA A1 segment shows diffuse narrowing  estimated to be 50% stenosis.  Left A1 ACA unremarkable.  Mild nonstenotic irregularity right MCA M1 segment.  Left MCA M1 segment shows 50% stenosis.  Both posterior cerebral arteries show proximal and distal irregularities, some which appear flow limiting, consistent with intracranial atherosclerotic change. This may represent manifestation of hypertensive cerebrovascular disease.  Similar segmental irregularity of the distal ACA and MCA branches is noted, also likely intracranial atherosclerotic change.  No visible cerebellar branch occlusion.  No intracranial aneurysm. No signs of venous sinus thrombosis.  No osseous abnormality.  Post infusion images demonstrate no abnormal intracranial enhancement. There are no signs of cerebral infarction or hemorrhage.  Redemonstrated is atrophy with chronic microvascular ischemic change.   Review of the MIP images confirms the above findings.  IMPRESSION: No proximal flow limiting stenosis of the internal carotid arteries, vertebral arteries, or basilar artery is identified.  Segmental irregularities of the distal ACA, MCA, as well as the proximal and distal PCA branches suggesting intracranial atherosclerotic change.  Vasculitis would be another consideration. If clinically indicated, formal catheter angiogram could be helpful in further evaluation.   Original Report Authenticated By: Davonna Belling, M.D.    Dg Chest 2 View  09/12/2012  *RADIOLOGY REPORT*  Clinical Data: Chest pain, hypertension, mitral valve prolapse  CHEST - 2 VIEW  Comparison: 09/24/2009; 04/15/2009; chest CT - 09/24/2009  Findings:  Grossly unchanged cardiac silhouette and mediastinal contours with mild tortuosity of the thoracic aorta.  The lungs remain hyperexpanded with flattening of the bilateral hemidiaphragms.  No focal airspace opacity.  No pleural effusion or pneumothorax.  No definite evidence of edema.  Unchanged bones.  IMPRESSION: Hyperexpanded lungs without acute  cardiopulmonary disease.    Original Report Authenticated By: Tacey Ruiz, MD    Ct Head Wo Contrast  09/12/2012  *RADIOLOGY REPORT*  Clinical Data: Near-syncope  CT HEAD WITHOUT CONTRAST  Technique:  Contiguous axial images were obtained from the base of the skull through the vertex without contrast.  Comparison: 09/13/2003  Findings: Mild global atrophy appropriate to age.  Minimal chronic ischemic changes in the periventricular white matter.  No mass effect, midline shift, or acute intracranial hemorrhage.  Mastoid air cells and visualized paranasal sinuses are clear.  Cranium is intact.  IMPRESSION: No acute intracranial pathology.   Original Report Authenticated By: Jolaine Click, M.D.    Mr Brain Wo Contrast  09/13/2012  *RADIOLOGY REPORT*  Clinical Data: Difficulty walking beginning yesterday.  Severe headache.  MRI HEAD WITHOUT CONTRAST  Technique:  Multiplanar, multiecho pulse sequences of the brain and surrounding structures were obtained according to standard protocol without intravenous contrast.  Comparison: CTA head 09/13/2012.  Findings: The diffusion weighted images demonstrate evidence for acute or subacute infarction.  Moderate atrophy and diffuse white matter disease is present bilaterally.  The ventricles are proportionate to the degree of atrophy.  No significant hemorrhage or mass lesion is present.  Flow is present in the major intracranial arteries.  The patient is status post bilateral lens extractions.  Mild mucosal thickening is present in the ethmoid air cells. The mastoid air cells are clear bilaterally.  IMPRESSION:  1.  No acute intracranial abnormality. 2.  Moderate atrophy white matter disease.  This likely reflects the sequelae of chronic microvascular ischemia.   Original Report Authenticated By: Marin Roberts, M.D.     MEDICATIONS                                                                                                                        Scheduled: . cloNIDine  0.1 mg Oral BID  .  digoxin  0.125 mg Oral Daily  . famotidine  20 mg Oral Daily  . loratadine  10 mg Oral Daily  . simethicone  160 mg Oral Once    ASSESSMENT/PLAN:                                                                                                            79 YO female with known gait instability and BPV. At present time having no complaints but feels when she stands up she feels dizzy, blurred vision and legs shaking. PT has assessed patient and S/O.  She did have orthostatic BP when going  from sitting to standing.  MRI brain and CTA head both normal. Dizziness likely partially due to orthostatic component along with her chronic gait instability and BPV.  No other acute etiology found.  If patient continues to have these symptoms would recommend she follow up out patient with neurology.   Neurology will S/O   Assessment and plan discussed with with attending physician and they are in agreement.    Felicie Morn PA-C [facility] [phone]  09/14/2012, 9:56 AM

## 2012-09-15 DIAGNOSIS — R030 Elevated blood-pressure reading, without diagnosis of hypertension: Secondary | ICD-10-CM | POA: Diagnosis not present

## 2012-09-15 DIAGNOSIS — R51 Headache: Secondary | ICD-10-CM | POA: Diagnosis not present

## 2012-09-15 DIAGNOSIS — R269 Unspecified abnormalities of gait and mobility: Secondary | ICD-10-CM | POA: Diagnosis not present

## 2012-09-15 DIAGNOSIS — I951 Orthostatic hypotension: Secondary | ICD-10-CM | POA: Diagnosis not present

## 2012-09-15 DIAGNOSIS — R0989 Other specified symptoms and signs involving the circulatory and respiratory systems: Secondary | ICD-10-CM | POA: Diagnosis present

## 2012-09-15 LAB — URINE CULTURE: Colony Count: 80000

## 2012-09-15 LAB — CBC
HCT: 32.9 % — ABNORMAL LOW (ref 36.0–46.0)
Hemoglobin: 11.4 g/dL — ABNORMAL LOW (ref 12.0–15.0)
MCV: 92.7 fL (ref 78.0–100.0)
RBC: 3.55 MIL/uL — ABNORMAL LOW (ref 3.87–5.11)
RDW: 13.1 % (ref 11.5–15.5)
WBC: 10.1 10*3/uL (ref 4.0–10.5)

## 2012-09-15 LAB — BASIC METABOLIC PANEL
BUN: 13 mg/dL (ref 6–23)
CO2: 20 mEq/L (ref 19–32)
Chloride: 107 mEq/L (ref 96–112)
GFR calc Af Amer: 85 mL/min — ABNORMAL LOW (ref >90)
Glucose, Bld: 114 mg/dL — ABNORMAL HIGH (ref 70–99)
Potassium: 3.6 mEq/L (ref 3.5–5.1)

## 2012-09-15 MED ORDER — CLONIDINE HCL 0.1 MG PO TABS: ORAL_TABLET | ORAL | Status: DC

## 2012-09-15 NOTE — Progress Notes (Signed)
Physical Therapy Treatment Patient Details Name: RAYSA BOSAK MRN: [id number] DOB: 1921/04/15 Today's Date: 09/15/2012 Time: 1478-2956 PT Time Calculation (min): 33 min  PT Assessment / Plan / Recommendation Comments on Treatment Session  Pt s/p gait instability and dizziness.  Pt limited by weakness and decr balance and decr endurance.  Will benefit from PT to address balance and endurance.  Pt progressing with ambulation.      Follow Up Recommendations  Home health PT;Supervision/Assistance - 24 hour                 Equipment Recommendations  None recommended by PT        Frequency Min 3X/week   Plan Discharge plan remains appropriate;Frequency remains appropriate    Precautions / Restrictions Precautions Precautions: Fall Restrictions Weight Bearing Restrictions: No   Pertinent Vitals/Pain VSS, No pain    Mobility  Bed Mobility Bed Mobility: Not assessed Transfers Transfers: Sit to Stand;Stand to Sit Sit to Stand: 4: Min assist;With upper extremity assist;With armrests;From chair/3-in-1 Stand to Sit: 4: Min assist;With upper extremity assist;With armrests;To chair/3-in-1 Stand Pivot Transfers: Not tested (comment) Details for Transfer Assistance: min assist to support trunk to get to standing.  Heavy reliance on upper extremity to get to stand, transfer and sit.  RW used for transfer.  Talked with pt about vestibular symptoms.  Pt reports she does not have any spinning dizziness at present.  Testing negative for vestibular issues at present.   Ambulation/Gait Ambulation/Gait Assistance: 4: Min guard Ambulation Distance (Feet): 200 Feet Assistive device: Rolling walker Ambulation/Gait Assistance Details: Pt ambulates slowly.  Needed occasional assist to maneuver RW.  Pt did not report any dizziness and for the most part was steady in controlled environment.  Pt ambulated to the bathroom. Gait Pattern: Step-through pattern;Decreased stride length;Decreased step  length - right;Decreased step length - left;Trunk flexed Gait velocity: decreased Stairs: No Wheelchair Mobility Wheelchair Mobility: No     PT Goals Acute Rehab PT Goals PT Goal: Sit to Stand - Progress: Progressing toward goal PT Goal: Stand to Sit - Progress: Progressing toward goal PT Goal: Ambulate - Progress: Progressing toward goal  Visit Information  Last PT Received On: 09/15/12 Assistance Needed: +1    Subjective Data  Subjective: "I feel so weak."   Cognition  Cognition Overall Cognitive Status: Impaired Area of Impairment: Memory Arousal/Alertness: Awake/alert Orientation Level: Oriented X4 / Intact Memory Deficits: self reported memory issues    Balance  Static Sitting Balance Static Sitting - Level of Assistance: Not tested (comment) Static Standing Balance Static Standing - Balance Support: Bilateral upper extremity supported Static Standing - Level of Assistance: 4: Min assist Static Standing - Comment/# of Minutes: min assist with RW.  Pt needs steadying assist with initial static stance.  Once up and moving around min guard with static stance without difficulty.    End of Session PT - End of Session Equipment Utilized During Treatment: Gait belt Activity Tolerance: Patient limited by fatigue Patient left: in chair;with call bell/phone within reach Nurse Communication: Mobility status        INGOLD,Frankie Zito 09/15/2012, 1:21 PM [facility] [phone] (pager)

## 2012-09-15 NOTE — Care Management Note (Signed)
    Page 1 of 1   09/15/2012     3:59:09 PM   CARE MANAGEMENT NOTE 09/15/2012  Patient:  [name], [name]   Account Number:  [id number]  Date Initiated:  09/15/2012  Documentation initiated by:  Allante Beane  Subjective/Objective Assessment:   PT ADM WITH HTN, GAIT INSTABILITY.  PTA, PT LIVES ALONE AND IS INDEPENDENT.  SHE HAS SUPPORTIVE SON AND DAUGHTER.     Action/Plan:   PT NEEDS HH FOLLOW UP.  MET WITH SON AND PT TO DISCUSS CHOICE. REFERRAL TO [facility], PER CHOICE.  START OF CARE 24-48H POST DC DATE.   Anticipated DC Date:  09/15/2012   Anticipated DC Plan:  HOME W HOME HEALTH SERVICES      DC Planning Services  CM consult      [facility] Choice  HOME HEALTH   Choice offered to / List presented to:  C-1 Patient        HH arranged  HH-1 RN  HH-2 PT      [facility] agency  [facility]   Status of service:  Completed, signed off Medicare Important Message given?   (If response is "NO", the following Medicare IM given date fields will be blank) Date Medicare IM given:   Date Additional Medicare IM given:    Discharge Disposition:  HOME W HOME HEALTH SERVICES  Per UR Regulation:  Reviewed for med. necessity/level of care/duration of stay  If discussed at Long Length of Stay Meetings, dates discussed:    Comments:  09/15/12 Devonne Kitchen,RN,BSN SON GIVEN PRIVATE DUTY SITTER LIST, PER REQUEST.

## 2012-09-15 NOTE — Progress Notes (Signed)
Pt and family educated and informed of DC information. Pt understands need for [facility] RN and PT, pt verbalizes understanding of meds and f/u appts. Pt and family have no further questions or concerns. Ready for DC home with family.

## 2012-09-16 NOTE — Discharge Summary (Signed)
Physician Discharge Summary  [name] ZOX:[id number] DOB: 04-29-1921 DOA: 09/12/2012  PCP: No primary provider on file.  Admit date: 09/12/2012 Discharge date: 09/15/2012  Time spent: Greater than 30 minutes  Recommendations for Outpatient Follow-up:  1. The patient will followup with Dr. Patty Sermons as scheduled. 2. She was instructed to take clonidine 0.1 mg each morning and to take a second dose in the evening if her systolic blood pressure was greater than 130. 3. She was instructed to drink 1 glass of water each morning before getting out of bed (she would have a glass of water at her night stand before she goes to bed each evening). She was instructed to stand slowly.  Discharge Diagnoses:   1. Gait instability, likely secondary to a combination of orthostatic hypotension and labile blood pressure. 2. Orthostatic hypotension and labile blood pressures. Concomitant malignant blood pressure. 3. History of vertigo. 4. Headache, likely secondary to hypertension. 5. Hypercholesterolemia. 6. Spinal stenosis of lumbar spine. 7. Hyperlipidemia.  Discharge Condition: Improved.  Diet recommendation: heart healthy.  Filed Weights   09/13/12 0800  Weight: 52.5 kg (115 lb 11.9 oz)    History of present illness:   [name] is a 79 y.o. female with history of labile hypertension, hyperlipidemia and mitral valve prolapse who presented to the ER because of difficulty walking.  Her symptoms initially started out with severe headache which started off as a "pop" sound. Following which she found it difficult to walk. She also was little confused and not sure if she lost her consciousness. Since the symptoms persisted she came to the ER. Her headache had largely improved but still persisted. She denied any difficulty seeing and talking or any loss of function of the upper or lower extremities. Denied any incontinence of urine or bowel. CT of the head was negative for any acute changes.   Since the patient has had spinal stenosis and surgery, lumbar puncture through fluoroscopy guided under radiology has been ordered to look for any xanthochromia.  Her  blood pressure had been running high and the patient and patient's daughter stated that her blood pressure usually runs very high with systolics around 180s to 190 and diastolic pressure around the 90s. She is very sensitive to antihypertensives and was not taking any medications for blood pressure control. She denied any chest pain, shortness of breath. abdominal pain, diarrhea or vomiting.  Hospital Course:  The patient was admitted to the step down unit with supportive treatment. Neurologist, Dr. Thana Farr was consulted. Per her recommendations, MRI of the head without contrast, neuro checks, physical therapy consultation, and orthostatic vital signs were ordered. Because the CT scan of the patient's head head was negative, Dr. Thad Ranger stated that the patient would not likely benefit from an LP. The patient was noted to be orthostatic with a 20 mm drop in her systolic blood pressure and stable diastolic blood pressure. Treating her accelerated hypertension in the setting of orthostatic hypotension and sensitivity to many antihypertensive medications was a challenge. Clonidine was tried. She was started out on 0.1 mg once daily then titrated up to twice a day if her systolic blood pressure was greater than 130.  With this regimen, the patient appeared to have tolerated. Also, because of the orthostatic hypotension, she was started on gentle IV fluids for 24 hours. Orthostatic changes decreased, but did not completely resolve.  The MRI of the patient's brain revealed no acute intracranial abnormalities, but with moderate atrophy and chronic microvascular changes. CT  angiogram was also ordered but it was essentially negative. She has an allergy to aspirin. Plavix was considered, but given her age and possible fall risk, it was not  started.  Her fasting lipid profile revealed total cholesterol of 229, triglycerides 168, HDL 38, and LDL of 157. Statin therapy was not started given the patient's multiple drug intolerances and the potential for causing myopathy. She was instructed on a low-fat diet. Her hemoglobin A1c was 5.8. Her urinalysis revealed 3-6 WBCs and a few bacteria, but she did not have symptomatology of urinary tract infection.  The physical therapist evaluated her and recommended home physical therapy, which was ordered. She provided the patient with remedies or strategies that would help her gait instability and occasional dizziness.  At the time of discharge, her systolic blood pressures were ranging from 125-155. She was asymptomatic with ambulation. She was discharged to home in improved and stable condition. She was instructed on clonidine dosing as prescribed. She voiced understanding.  Procedures:  None  Consultations:  Neurologist, Dr. Thana Farr.  Discharge Exam: Filed Vitals:   09/15/12 0658 09/15/12 1000 09/15/12 1014 09/15/12 1345  BP: 165/63  154/63 123/53  Pulse: 68 57 57 69  Temp:    98.3 F (36.8 C)  TempSrc:    Oral  Resp:    19  Height:      Weight:      SpO2:    99%    General: pleasant alert elderly 79 year old Caucasian woman sitting up in a chair, in no acute distress. Cardiovascular: S1, S2, with a soft systolic murmur. Respiratory: clear to auscultation bilaterally. Neurologic: She is alert and oriented x3. Cranial nerves II through XII are intact. No nystagmus.  Discharge Instructions  Discharge Orders   Future Appointments Provider Department Dept Phone   09/25/2012 11:45 AM Cassell Clement, MD [facility] Main Office [place]) [phone]   Future Orders Complete By Expires     Diet - low sodium heart healthy  As directed     Discharge instructions  As directed     Comments:      STAND SLOWLY. HAVE A GLASS OF WATER ON YOUR NIGHT STAND EACH NIGHT.  BEFORE GETTING UP IN THE MORNING, DRINK A GLASS OF WATER.    Increase activity slowly  As directed         Medication List    TAKE these medications       cetirizine 10 MG tablet  Commonly known as:  ZYRTEC  Take 10 mg by mouth daily as needed for allergies. As needed     cloNIDine 0.1 MG tablet  Commonly known as:  CATAPRES  TAKE 1 TABLET IN THE MORNING. TAKE ANOTHER TABLET AT 9:00 PM IF YOUR SYSTOLIC BLOOD PRESSURE(TOP NUMBER ON THE READING)  IS GREATER THAN 130.     digoxin 0.125 MG tablet  Commonly known as:  LANOXIN  Take 0.125 mg by mouth daily.     dimenhyDRINATE 50 MG tablet  Commonly known as:  DRAMAMINE  Take 50 mg by mouth every 8 (eight) hours as needed (for nausea).     famotidine 20 MG tablet  Commonly known as:  PEPCID  Take 20 mg by mouth daily.     sodium chloride 0.65 % nasal spray  Commonly known as:  OCEAN  Place 1 spray into the nose as needed for congestion.           Follow-up Information   Follow up with Cassell Clement, MD On 09/25/2012. (AT  11:45 AM)    Contact information:   [address] [place] [address] [phone]        The results of significant diagnostics from this hospitalization (including imaging, microbiology, ancillary and laboratory) are listed below for reference.    Significant Diagnostic Studies: Ct Angio Head W/cm &/or Wo Cm  09/13/2012  *RADIOLOGY REPORT*  Clinical Data:  Multiple near falls, gait difficulty.  CT ANGIOGRAPHY HEAD  Technique:  Multidetector CT imaging of the head was performed using the standard protocol during bolus administration of intravenous contrast.  Multiplanar CT image reconstructions including MIPs were obtained to evaluate the vascular anatomy.  Contrast: 50mL OMNIPAQUE IOHEXOL 350 MG/ML SOLN  Comparison:  CT head 09/12/2012.  Findings:  Bilateral cavernous and supraclinoid internal carotid artery calcific atheromatous change results in symmetric estimated 50% stenoses, not  clearly flow reducing.  Both vertebral arteries are widely patent and codominant.  The basilar artery is also widely patent.  Right ACA A1 segment shows diffuse narrowing estimated to be 50% stenosis.  Left A1 ACA unremarkable.  Mild nonstenotic irregularity right MCA M1 segment.  Left MCA M1 segment shows 50% stenosis.  Both posterior cerebral arteries show proximal and distal irregularities, some which appear flow limiting, consistent with intracranial atherosclerotic change. This may represent manifestation of hypertensive cerebrovascular disease.  Similar segmental irregularity of the distal ACA and MCA branches is noted, also likely intracranial atherosclerotic change.  No visible cerebellar branch occlusion.  No intracranial aneurysm. No signs of venous sinus thrombosis.  No osseous abnormality.  Post infusion images demonstrate no abnormal intracranial enhancement. There are no signs of cerebral infarction or hemorrhage.  Redemonstrated is atrophy with chronic microvascular ischemic change.   Review of the MIP images confirms the above findings.  IMPRESSION: No proximal flow limiting stenosis of the internal carotid arteries, vertebral arteries, or basilar artery is identified.  Segmental irregularities of the distal ACA, MCA, as well as the proximal and distal PCA branches suggesting intracranial atherosclerotic change.  Vasculitis would be another consideration. If clinically indicated, formal catheter angiogram could be helpful in further evaluation.   Original Report Authenticated By: Davonna Belling, M.D.    Dg Chest 2 View  09/12/2012  *RADIOLOGY REPORT*  Clinical Data: Chest pain, hypertension, mitral valve prolapse  CHEST - 2 VIEW  Comparison: 09/24/2009; 04/15/2009; chest CT - 09/24/2009  Findings:  Grossly unchanged cardiac silhouette and mediastinal contours with mild tortuosity of the thoracic aorta.  The lungs remain hyperexpanded with flattening of the bilateral hemidiaphragms.  No focal airspace  opacity.  No pleural effusion or pneumothorax.  No definite evidence of edema.  Unchanged bones.  IMPRESSION: Hyperexpanded lungs without acute cardiopulmonary disease.   Original Report Authenticated By: Tacey Ruiz, MD    Ct Head Wo Contrast  09/12/2012  *RADIOLOGY REPORT*  Clinical Data: Near-syncope  CT HEAD WITHOUT CONTRAST  Technique:  Contiguous axial images were obtained from the base of the skull through the vertex without contrast.  Comparison: 09/13/2003  Findings: Mild global atrophy appropriate to age.  Minimal chronic ischemic changes in the periventricular white matter.  No mass effect, midline shift, or acute intracranial hemorrhage.  Mastoid air cells and visualized paranasal sinuses are clear.  Cranium is intact.  IMPRESSION: No acute intracranial pathology.   Original Report Authenticated By: Jolaine Click, M.D.    Mr Brain Wo Contrast  09/13/2012  *RADIOLOGY REPORT*  Clinical Data: Difficulty walking beginning yesterday.  Severe headache.  MRI HEAD WITHOUT CONTRAST  Technique:  Multiplanar, multiecho pulse sequences of the brain and surrounding structures were obtained according to standard protocol without intravenous contrast.  Comparison: CTA head 09/13/2012.  Findings: The diffusion weighted images demonstrate evidence for acute or subacute infarction.  Moderate atrophy and diffuse white matter disease is present bilaterally.  The ventricles are proportionate to the degree of atrophy.  No significant hemorrhage or mass lesion is present.  Flow is present in the major intracranial arteries.  The patient is status post bilateral lens extractions.  Mild mucosal thickening is present in the ethmoid air cells. The mastoid air cells are clear bilaterally.  IMPRESSION:  1.  No acute intracranial abnormality. 2.  Moderate atrophy white matter disease.  This likely reflects the sequelae of chronic microvascular ischemia.   Original Report Authenticated By: Marin Roberts, M.D.        Recent Labs Lab 09/12/12 1900 09/13/12 0410 09/15/12 0505  NA 136 137 136  K 4.1 3.9 3.6  CL 103 102 107  CO2  --  21 20  GLUCOSE 124* 104* 114*  BUN 14 14 13   CREATININE 0.80 0.73 0.74  CALCIUM  --  9.1 8.1*   Liver Function Tests:  Recent Labs Lab 09/13/12 0410  AST 22  ALT 20  ALKPHOS 80  BILITOT 0.5  PROT 6.1  ALBUMIN 3.4*   No results found for this basename: LIPASE, AMYLASE,  in the last 168 hours No results found for this basename: AMMONIA,  in the last 168 hours CBC:  Recent Labs Lab 09/12/12 1826 09/12/12 1900 09/13/12 0410 09/15/12 0505  WBC 12.8*  --  13.4* 10.1  NEUTROABS 9.2*  --  7.0  --   HGB 13.9 14.3 13.5 11.4*  HCT 39.6 42.0 39.3 32.9*  MCV 90.8  --  91.4 92.7  PLT 230  --  234 185   Cardiac Enzymes: No results found for this basename: CKTOTAL, CKMB, CKMBINDEX, TROPONINI,  in the last 168 hours BNP: BNP (last 3 results) No results found for this basename: PROBNP,  in the last 8760 hours CBG:  Recent Labs Lab 09/13/12 1648 09/13/12 2140 09/14/12 0728 09/14/12 1203 09/14/12 1625  GLUCAP 121* 122* 102* 128* 117*       Signed:  Roland Prine  [facility] 09/16/2012, 6:04 PM

## 2012-09-17 DIAGNOSIS — Z602 Problems related to living alone: Secondary | ICD-10-CM | POA: Diagnosis not present

## 2012-09-17 DIAGNOSIS — I059 Rheumatic mitral valve disease, unspecified: Secondary | ICD-10-CM | POA: Diagnosis not present

## 2012-09-17 DIAGNOSIS — R269 Unspecified abnormalities of gait and mobility: Secondary | ICD-10-CM | POA: Diagnosis not present

## 2012-09-17 DIAGNOSIS — I10 Essential (primary) hypertension: Secondary | ICD-10-CM | POA: Diagnosis not present

## 2012-09-17 DIAGNOSIS — H8110 Benign paroxysmal vertigo, unspecified ear: Secondary | ICD-10-CM | POA: Diagnosis not present

## 2012-09-19 DIAGNOSIS — I10 Essential (primary) hypertension: Secondary | ICD-10-CM | POA: Diagnosis not present

## 2012-09-19 DIAGNOSIS — H8110 Benign paroxysmal vertigo, unspecified ear: Secondary | ICD-10-CM | POA: Diagnosis not present

## 2012-09-19 DIAGNOSIS — Z602 Problems related to living alone: Secondary | ICD-10-CM | POA: Diagnosis not present

## 2012-09-19 DIAGNOSIS — R269 Unspecified abnormalities of gait and mobility: Secondary | ICD-10-CM | POA: Diagnosis not present

## 2012-09-19 DIAGNOSIS — I059 Rheumatic mitral valve disease, unspecified: Secondary | ICD-10-CM | POA: Diagnosis not present

## 2012-09-25 ENCOUNTER — Encounter: Payer: Self-pay | Admitting: Cardiology

## 2012-09-25 ENCOUNTER — Ambulatory Visit (INDEPENDENT_AMBULATORY_CARE_PROVIDER_SITE_OTHER): Payer: Medicare Other | Admitting: Cardiology

## 2012-09-25 VITALS — BP 136/74 | HR 79 | Ht 63.0 in | Wt 114.6 lb

## 2012-09-25 DIAGNOSIS — I951 Orthostatic hypotension: Secondary | ICD-10-CM

## 2012-09-25 DIAGNOSIS — M48061 Spinal stenosis, lumbar region without neurogenic claudication: Secondary | ICD-10-CM

## 2012-09-25 DIAGNOSIS — E7800 Pure hypercholesterolemia, unspecified: Secondary | ICD-10-CM | POA: Diagnosis not present

## 2012-09-25 DIAGNOSIS — I10 Essential (primary) hypertension: Secondary | ICD-10-CM | POA: Diagnosis not present

## 2012-09-25 DIAGNOSIS — I119 Hypertensive heart disease without heart failure: Secondary | ICD-10-CM | POA: Diagnosis not present

## 2012-09-25 DIAGNOSIS — R002 Palpitations: Secondary | ICD-10-CM

## 2012-09-25 NOTE — Progress Notes (Signed)
[name] Date of Birth:  01/25/1921 [facility] [address] [place], [place]  [address] [phone]         Fax   [phone]  History of Present Illness: This pleasant 79 year old woman is seen for a six-month followup office visit. She has a past history of labile hypertension and hypercholesterolemia. She also has a history of severe back pain secondary to spinal stenosis. She's had a lot of problems with dizziness nausea and profound weakness. She does not have any history of ischemic heart disease. She had a normal nuclear stress test in March 2011. She had an echocardiogram in 2009 showing impaired relaxation and mild aortic sclerosis. The patient was hospitalized at [facility] from 09/12/12 until 09/15/12.  She was hospitalized because of orthostatic hypotension as well as severe hypertension at times.  She also had difficulty ambulating because of dizziness and lightheadedness.  She had an MRI which was negative.  Her blood pressure was eventually brought under control with clonidine 0.1 mg twice a day.  He is feeling better and has had no further severe near syncopal episodes.   Current Outpatient Prescriptions  Medication Sig Dispense Refill  . cetirizine (ZYRTEC) 10 MG tablet Take 10 mg by mouth daily as needed for allergies. As needed      . cloNIDine (CATAPRES) 0.1 MG tablet TAKE 1 TABLET IN THE MORNING. TAKE ANOTHER TABLET AT 9:00 PM IF YOUR SYSTOLIC BLOOD PRESSURE(TOP NUMBER ON THE READING)  IS GREATER THAN 130.  60 tablet  2  . digoxin (LANOXIN) 0.125 MG tablet Take 0.125 mg by mouth daily.      Marland Kitchen dimenhyDRINATE (DRAMAMINE) 50 MG tablet Take 50 mg by mouth every 8 (eight) hours as needed (for nausea).      . famotidine (PEPCID) 20 MG tablet Take 20 mg by mouth daily.        . sodium chloride (OCEAN) 0.65 % nasal spray Place 1 spray into the nose as needed for congestion.       No current facility-administered medications for this visit.      Allergies  Allergen Reactions  . Amlodipine Nausea Only  . Aspirin   . Bystolic (Nebivolol Hcl)     Vision problem  . Calan (Verapamil Hcl)     Weakness and staggering  . Cephalosporins   . Codeine   . Cozaar   . Elavil (Amitriptyline Hcl)   . Erythromycin   . Gantrisin (Sulfisoxazole)   . Hctz (Hydrochlorothiazide)   . Iodides   . Lopressor (Metoprolol Tartrate)   . Micardis (Telmisartan)   . Morphine And Related   . Nizatidine   . Nsaids   . Pantoprazole Sodium   . Penicillins   . Prilosec (Omeprazole)   . Promethazine Hcl   . Streptomycin   . Sulfa Drugs Cross Reactors   . Tequin   . Tetracyclines & Related   . Toprol Xl (Metoprolol Succinate)   . Ultram (Tramadol)   . Valium   . Valturna (Aliskiren-Valsartan) Nausea And Vomiting    Patient Active Problem List  Diagnosis  . Benign hypertensive heart disease without heart failure  . Spinal stenosis of lumbar region  . History of vertigo  . Hypercholesterolemia  . Nausea alone  . Gait instability  . Headache  . Hypertension, accelerated  . ? Orthostatic hypotension  . Labile blood pressure    History  Smoking status  . Never Smoker   Smokeless tobacco  . Not on file  History  Alcohol Use No    No family history on file.  Review of Systems: Constitutional: no fever chills diaphoresis or fatigue or change in weight.  Head and neck: no hearing loss, no epistaxis, no photophobia or visual disturbance. Respiratory: No cough, shortness of breath or wheezing. Cardiovascular: No chest pain peripheral edema, palpitations. Gastrointestinal: No abdominal distention, no abdominal pain, no change in bowel habits hematochezia or melena. Genitourinary: No dysuria, no frequency, no urgency, no nocturia. Musculoskeletal:No arthralgias, no back pain, no gait disturbance or myalgias. Neurological: No dizziness, no headaches, no numbness, no seizures, no syncope, no weakness, no tremors. Hematologic: No  lymphadenopathy, no easy bruising. Psychiatric: No confusion, no hallucinations, no sleep disturbance.    Physical Exam: Filed Vitals:   09/25/12 1208  BP: 136/74  Pulse: 79   the general appearance reveals a well-developed well-nourished elderly woman.  She arrives by wheelchair today.  Her daughter is with her.The head and neck exam reveals pupils equal and reactive.  Extraocular movements are full.  There is no scleral icterus.  The mouth and pharynx are normal.  The neck is supple.  The carotids reveal no bruits.  The jugular venous pressure is normal.  The  thyroid is not enlarged.  There is no lymphadenopathy.  The chest is clear to percussion and auscultation.  There are no rales or rhonchi.  Expansion of the chest is symmetrical.  The precordium is quiet.  The first heart sound is normal.  The second heart sound is physiologically split.  There is no murmur gallop rub or click.  There is no abnormal lift or heave.  The abdomen is soft and nontender.  The bowel sounds are normal.  The liver and spleen are not enlarged.  There are no abdominal masses.  There are no abdominal bruits.  Extremities reveal good pedal pulses.  There is no phlebitis or edema.  There is no cyanosis or clubbing.  Strength is normal and symmetrical in all extremities.  There is no lateralizing weakness.  There are no sensory deficits.  The skin is warm and dry.  There is no rash.     Assessment / Plan: Continue same medication. Recheck in 6 months for followup office visit.  The patient is concerned about chronic dryness on the bottom of her right foot.  She has good pedal pulses.  Her skin appears to be very dry and I encouraged her to use plenty of skin lotion to prevent the bottom of her feet from cracking.  She does not have any evidence of arterial insufficiency to the feet

## 2012-09-25 NOTE — Assessment & Plan Note (Signed)
Heart the patient's palpitations appear to be stable on current small dose of digoxin.

## 2012-09-25 NOTE — Assessment & Plan Note (Signed)
Patient has a history of spinal stenosis which has been stable.

## 2012-09-25 NOTE — Assessment & Plan Note (Signed)
Pressure is presently in the desired range on her current dose of clonidine 0.1 mg twice a day which she appears to be tolerating without significant side effects.

## 2012-09-25 NOTE — Assessment & Plan Note (Signed)
Patient has a history of hypercholesterolemia but has refused statin therapy.  She has multiple drug intolerances

## 2012-09-25 NOTE — Patient Instructions (Addendum)
Your physician recommends that you continue on your current medications as directed. Please refer to the Current Medication list given to you today.  Your physician wants you to follow-up in: 6 month. You will receive a reminder letter in the mail two months in advance. If you don't receive a letter, please call our office to schedule the follow-up appointment.  

## 2012-09-27 ENCOUNTER — Telehealth: Payer: Self-pay | Admitting: Cardiology

## 2012-09-27 NOTE — Telephone Encounter (Signed)
New Prob     Calling to notify physician that she was scheduled for a visit this week, but she refused visit.

## 2012-09-27 NOTE — Telephone Encounter (Signed)
Noted  

## 2012-09-27 NOTE — Telephone Encounter (Signed)
Patient refused home health visit, will forward to  Dr. Patty Sermons so he is aware

## 2012-09-28 ENCOUNTER — Telehealth: Payer: Self-pay | Admitting: Cardiology

## 2012-09-28 NOTE — Telephone Encounter (Signed)
Advised Stacy.  

## 2012-09-28 NOTE — Telephone Encounter (Signed)
Will forward to  Dr. Brackbill for review 

## 2012-09-28 NOTE — Telephone Encounter (Signed)
New Problem:    Called in because the patient refused physical therapy stating that she is too weak to participate.  Stated that she would prefer that they come back and retry in a few weeks.  Please call back if you have any

## 2012-09-28 NOTE — Telephone Encounter (Signed)
Okay to postpone physical therapy until a later date.

## 2012-10-03 ENCOUNTER — Encounter: Payer: Self-pay | Admitting: Cardiology

## 2012-10-06 ENCOUNTER — Telehealth: Payer: Self-pay | Admitting: Cardiology

## 2012-10-06 NOTE — Telephone Encounter (Signed)
Information only  Will forward to  Dr. Patty Sermons

## 2012-10-06 NOTE — Telephone Encounter (Signed)
New problem   [name] stated pt was referred to [facility] and is refusing there services, states she is to busy and have other things to do, it very overwhelming for her she is 79 years old and didn't ask for these services.. Just an FYI from [facility]. Please call if you have any questions.

## 2012-10-11 NOTE — Telephone Encounter (Signed)
Refusal of [facility] services noted.

## 2012-10-16 DIAGNOSIS — R42 Dizziness and giddiness: Secondary | ICD-10-CM | POA: Diagnosis not present

## 2012-10-16 DIAGNOSIS — I10 Essential (primary) hypertension: Secondary | ICD-10-CM | POA: Diagnosis not present

## 2012-10-16 DIAGNOSIS — J309 Allergic rhinitis, unspecified: Secondary | ICD-10-CM | POA: Diagnosis not present

## 2012-10-16 DIAGNOSIS — Q248 Other specified congenital malformations of heart: Secondary | ICD-10-CM | POA: Diagnosis not present

## 2012-10-25 DIAGNOSIS — M79609 Pain in unspecified limb: Secondary | ICD-10-CM | POA: Diagnosis not present

## 2012-10-25 DIAGNOSIS — B351 Tinea unguium: Secondary | ICD-10-CM | POA: Diagnosis not present

## 2012-10-26 NOTE — Telephone Encounter (Signed)
New Prob      Was referred for home health, has been declining services. Wanted to notify Dr. Patty Sermons.

## 2012-10-26 NOTE — Telephone Encounter (Signed)
agree

## 2012-10-26 NOTE — Telephone Encounter (Signed)
Verbal ok given to d/c services since patient refusing. Per Darl Pikes patient stated she did not want any services/care from them . Will forward to  Dr. Patty Sermons for review

## 2012-10-30 DIAGNOSIS — I10 Essential (primary) hypertension: Secondary | ICD-10-CM | POA: Diagnosis not present

## 2012-10-30 DIAGNOSIS — J309 Allergic rhinitis, unspecified: Secondary | ICD-10-CM | POA: Diagnosis not present

## 2012-10-30 DIAGNOSIS — R42 Dizziness and giddiness: Secondary | ICD-10-CM | POA: Diagnosis not present

## 2012-10-30 DIAGNOSIS — Q248 Other specified congenital malformations of heart: Secondary | ICD-10-CM | POA: Diagnosis not present

## 2012-11-21 ENCOUNTER — Other Ambulatory Visit: Payer: Self-pay | Admitting: *Deleted

## 2012-11-21 MED ORDER — BUSPIRONE HCL 5 MG PO TABS: 5.0000 mg | ORAL_TABLET | Freq: Two times a day (BID) | ORAL | Status: DC

## 2012-11-21 NOTE — Telephone Encounter (Signed)
Ok to refill per  Dr. Patty Sermons and add to medication list

## 2012-12-07 DIAGNOSIS — I10 Essential (primary) hypertension: Secondary | ICD-10-CM | POA: Diagnosis not present

## 2012-12-07 DIAGNOSIS — R42 Dizziness and giddiness: Secondary | ICD-10-CM | POA: Diagnosis not present

## 2012-12-07 DIAGNOSIS — Q248 Other specified congenital malformations of heart: Secondary | ICD-10-CM | POA: Diagnosis not present

## 2012-12-07 DIAGNOSIS — J309 Allergic rhinitis, unspecified: Secondary | ICD-10-CM | POA: Diagnosis not present

## 2012-12-27 DIAGNOSIS — B351 Tinea unguium: Secondary | ICD-10-CM | POA: Diagnosis not present

## 2012-12-27 DIAGNOSIS — M79609 Pain in unspecified limb: Secondary | ICD-10-CM | POA: Diagnosis not present

## 2013-02-28 DIAGNOSIS — M779 Enthesopathy, unspecified: Secondary | ICD-10-CM | POA: Diagnosis not present

## 2013-03-04 ENCOUNTER — Other Ambulatory Visit: Payer: Self-pay | Admitting: Cardiology

## 2013-03-13 DIAGNOSIS — R42 Dizziness and giddiness: Secondary | ICD-10-CM | POA: Diagnosis not present

## 2013-03-13 DIAGNOSIS — I10 Essential (primary) hypertension: Secondary | ICD-10-CM | POA: Diagnosis not present

## 2013-03-13 DIAGNOSIS — Q248 Other specified congenital malformations of heart: Secondary | ICD-10-CM | POA: Diagnosis not present

## 2013-03-13 DIAGNOSIS — Z8719 Personal history of other diseases of the digestive system: Secondary | ICD-10-CM | POA: Diagnosis not present

## 2013-03-15 DIAGNOSIS — Z23 Encounter for immunization: Secondary | ICD-10-CM | POA: Diagnosis not present

## 2013-03-21 DIAGNOSIS — Q248 Other specified congenital malformations of heart: Secondary | ICD-10-CM | POA: Diagnosis not present

## 2013-03-21 DIAGNOSIS — R7309 Other abnormal glucose: Secondary | ICD-10-CM | POA: Diagnosis not present

## 2013-03-21 DIAGNOSIS — I10 Essential (primary) hypertension: Secondary | ICD-10-CM | POA: Diagnosis not present

## 2013-03-21 DIAGNOSIS — R42 Dizziness and giddiness: Secondary | ICD-10-CM | POA: Diagnosis not present

## 2013-03-29 DIAGNOSIS — D72829 Elevated white blood cell count, unspecified: Secondary | ICD-10-CM | POA: Diagnosis not present

## 2013-03-29 DIAGNOSIS — R892 Abnormal level of other drugs, medicaments and biological substances in specimens from other organs, systems and tissues: Secondary | ICD-10-CM | POA: Diagnosis not present

## 2013-04-23 DIAGNOSIS — M129 Arthropathy, unspecified: Secondary | ICD-10-CM | POA: Diagnosis not present

## 2013-04-23 DIAGNOSIS — I10 Essential (primary) hypertension: Secondary | ICD-10-CM | POA: Diagnosis not present

## 2013-04-24 DIAGNOSIS — Z5181 Encounter for therapeutic drug level monitoring: Secondary | ICD-10-CM | POA: Diagnosis not present

## 2013-05-02 ENCOUNTER — Encounter: Payer: Self-pay | Admitting: Podiatry

## 2013-05-02 ENCOUNTER — Ambulatory Visit: Payer: Medicare Other | Admitting: Podiatry

## 2013-05-02 VITALS — BP 161/86 | HR 67 | Resp 16 | Ht 63.0 in | Wt 114.0 lb

## 2013-05-02 DIAGNOSIS — M79609 Pain in unspecified limb: Secondary | ICD-10-CM

## 2013-05-02 DIAGNOSIS — B351 Tinea unguium: Secondary | ICD-10-CM | POA: Diagnosis not present

## 2013-05-02 NOTE — Progress Notes (Signed)
This patient presents today with a chief complaint of painful toenails one through 5 bilateral.  Objective: Pulses are palpable bilateral nails are thick yellow dystrophic clinically mycotic and painful palpation as well as debridement.  Assessment: Pain in limb secondary to onychomycosis 1 through 5 bilateral.  Plan: Debridement of nails in thickness and length as a covered service secondary to pain

## 2013-05-07 DIAGNOSIS — M6530 Trigger finger, unspecified finger: Secondary | ICD-10-CM | POA: Diagnosis not present

## 2013-05-11 ENCOUNTER — Other Ambulatory Visit: Payer: Self-pay | Admitting: Cardiology

## 2013-05-17 DIAGNOSIS — M6530 Trigger finger, unspecified finger: Secondary | ICD-10-CM | POA: Diagnosis not present

## 2013-07-11 ENCOUNTER — Ambulatory Visit (INDEPENDENT_AMBULATORY_CARE_PROVIDER_SITE_OTHER): Payer: Medicare Other | Admitting: Podiatry

## 2013-07-11 ENCOUNTER — Encounter: Payer: Self-pay | Admitting: Podiatry

## 2013-07-11 VITALS — BP 85/56 | HR 75 | Resp 18

## 2013-07-11 DIAGNOSIS — M79609 Pain in unspecified limb: Secondary | ICD-10-CM | POA: Diagnosis not present

## 2013-07-11 DIAGNOSIS — B351 Tinea unguium: Secondary | ICD-10-CM

## 2013-07-11 NOTE — Progress Notes (Signed)
Toenails trimmed because they're painful.  Objective: Vital signs are stable she is alert and oriented x3. Nails are thick yellow dystrophic clinically mycotic and painful palpation.  Assessment: Pain in limb secondary to onychomycosis 1 through 5 bilateral.  Plan: Debridement of nails 1 through 5 bilateral covered service secondary to pain.

## 2013-07-25 DIAGNOSIS — I10 Essential (primary) hypertension: Secondary | ICD-10-CM | POA: Diagnosis not present

## 2013-07-25 DIAGNOSIS — J209 Acute bronchitis, unspecified: Secondary | ICD-10-CM | POA: Diagnosis not present

## 2013-07-25 DIAGNOSIS — K219 Gastro-esophageal reflux disease without esophagitis: Secondary | ICD-10-CM | POA: Diagnosis not present

## 2013-07-25 DIAGNOSIS — Q248 Other specified congenital malformations of heart: Secondary | ICD-10-CM | POA: Diagnosis not present

## 2013-08-15 ENCOUNTER — Other Ambulatory Visit: Payer: Self-pay | Admitting: Family Medicine

## 2013-08-15 DIAGNOSIS — M79609 Pain in unspecified limb: Secondary | ICD-10-CM

## 2013-08-15 DIAGNOSIS — I498 Other specified cardiac arrhythmias: Secondary | ICD-10-CM | POA: Diagnosis not present

## 2013-08-15 LAB — DIGOXIN LEVEL: DIGOXIN: 1.06 ng/mL

## 2013-09-26 ENCOUNTER — Ambulatory Visit (INDEPENDENT_AMBULATORY_CARE_PROVIDER_SITE_OTHER): Payer: Medicare Other | Admitting: Podiatry

## 2013-09-26 VITALS — Resp 16 | Ht 63.0 in | Wt 116.0 lb

## 2013-09-26 DIAGNOSIS — B351 Tinea unguium: Secondary | ICD-10-CM | POA: Diagnosis not present

## 2013-09-26 DIAGNOSIS — M79609 Pain in unspecified limb: Secondary | ICD-10-CM

## 2013-09-26 NOTE — Progress Notes (Signed)
She presents today with a chief complaint of painful elongated toenails and a callus to the fifth digit of the right foot.  Objective: Vital signs are stable she is alert and oriented x3. Pulses are palpable bilateral. Nails are thick yellow dystrophic onychomycotic and painful palpation.  Assessment: Pain in limb secondary to onychomycosis 1 through 5 bilateral. Corn fifth digit of the right foot.  Plan: Debridement of nails 1 through 5 bilateral. Debridement of reactive hyperkeratotic lesion cover service secondary to pain followup with her in 3 months.

## 2013-10-25 DIAGNOSIS — I10 Essential (primary) hypertension: Secondary | ICD-10-CM | POA: Diagnosis not present

## 2013-10-25 DIAGNOSIS — S43499A Other sprain of unspecified shoulder joint, initial encounter: Secondary | ICD-10-CM | POA: Diagnosis not present

## 2013-10-25 DIAGNOSIS — J309 Allergic rhinitis, unspecified: Secondary | ICD-10-CM | POA: Diagnosis not present

## 2013-10-25 DIAGNOSIS — Q248 Other specified congenital malformations of heart: Secondary | ICD-10-CM | POA: Diagnosis not present

## 2013-10-31 DIAGNOSIS — IMO0002 Reserved for concepts with insufficient information to code with codable children: Secondary | ICD-10-CM | POA: Diagnosis not present

## 2013-11-02 DIAGNOSIS — Z8719 Personal history of other diseases of the digestive system: Secondary | ICD-10-CM | POA: Diagnosis not present

## 2013-11-02 DIAGNOSIS — I10 Essential (primary) hypertension: Secondary | ICD-10-CM | POA: Diagnosis not present

## 2013-11-02 DIAGNOSIS — R42 Dizziness and giddiness: Secondary | ICD-10-CM | POA: Diagnosis not present

## 2013-11-02 DIAGNOSIS — R5381 Other malaise: Secondary | ICD-10-CM | POA: Diagnosis not present

## 2013-11-06 ENCOUNTER — Ambulatory Visit: Payer: Medicare Other | Admitting: Cardiovascular Disease

## 2013-11-08 DIAGNOSIS — Z85828 Personal history of other malignant neoplasm of skin: Secondary | ICD-10-CM | POA: Diagnosis not present

## 2013-11-08 DIAGNOSIS — C44320 Squamous cell carcinoma of skin of unspecified parts of face: Secondary | ICD-10-CM | POA: Diagnosis not present

## 2013-11-08 DIAGNOSIS — L578 Other skin changes due to chronic exposure to nonionizing radiation: Secondary | ICD-10-CM | POA: Diagnosis not present

## 2013-11-08 DIAGNOSIS — L820 Inflamed seborrheic keratosis: Secondary | ICD-10-CM | POA: Diagnosis not present

## 2013-11-08 DIAGNOSIS — D485 Neoplasm of uncertain behavior of skin: Secondary | ICD-10-CM | POA: Diagnosis not present

## 2013-11-08 DIAGNOSIS — L821 Other seborrheic keratosis: Secondary | ICD-10-CM | POA: Diagnosis not present

## 2013-11-09 ENCOUNTER — Ambulatory Visit (INDEPENDENT_AMBULATORY_CARE_PROVIDER_SITE_OTHER): Payer: Medicare Other | Admitting: Cardiovascular Disease

## 2013-11-09 ENCOUNTER — Encounter: Payer: Self-pay | Admitting: Cardiovascular Disease

## 2013-11-09 ENCOUNTER — Ambulatory Visit: Payer: Medicare Other | Admitting: Cardiovascular Disease

## 2013-11-09 VITALS — BP 154/90 | HR 88 | Ht 60.0 in | Wt 115.2 lb

## 2013-11-09 DIAGNOSIS — R51 Headache: Secondary | ICD-10-CM

## 2013-11-09 DIAGNOSIS — R030 Elevated blood-pressure reading, without diagnosis of hypertension: Secondary | ICD-10-CM

## 2013-11-09 DIAGNOSIS — R2681 Unsteadiness on feet: Secondary | ICD-10-CM

## 2013-11-09 DIAGNOSIS — R5383 Other fatigue: Secondary | ICD-10-CM

## 2013-11-09 DIAGNOSIS — R531 Weakness: Secondary | ICD-10-CM

## 2013-11-09 DIAGNOSIS — R079 Chest pain, unspecified: Secondary | ICD-10-CM | POA: Diagnosis not present

## 2013-11-09 DIAGNOSIS — R5381 Other malaise: Secondary | ICD-10-CM

## 2013-11-09 DIAGNOSIS — I10 Essential (primary) hypertension: Secondary | ICD-10-CM

## 2013-11-09 DIAGNOSIS — R519 Headache, unspecified: Secondary | ICD-10-CM

## 2013-11-09 DIAGNOSIS — R55 Syncope and collapse: Secondary | ICD-10-CM | POA: Diagnosis not present

## 2013-11-09 DIAGNOSIS — R269 Unspecified abnormalities of gait and mobility: Secondary | ICD-10-CM

## 2013-11-09 DIAGNOSIS — R110 Nausea: Secondary | ICD-10-CM

## 2013-11-09 DIAGNOSIS — R0989 Other specified symptoms and signs involving the circulatory and respiratory systems: Secondary | ICD-10-CM

## 2013-11-09 MED ORDER — CLONIDINE HCL 0.1 MG PO TABS: 0.1000 mg | ORAL_TABLET | Freq: Two times a day (BID) | ORAL | Status: DC

## 2013-11-09 NOTE — Assessment & Plan Note (Addendum)
Allergy list was reviewed with her. She has an extensive allergy history. At her request, we will restart clonidine as she was taking previously one half dose morning and noon with full dose in the evening. We have suggested she try her blood pressure several times per week, not excessively. If blood pressure continues to run high, perhaps she could take extra half dose clonidine as needed, other alternative would be to take hydralazine when necessary. Other medications that have not been tried might include low dose of Cardura or short acting diltiazem/verapamil

## 2013-11-09 NOTE — Patient Instructions (Signed)
Please restart clonidine 1/2 pill in the morning and noon, whole pill at bedtime Please check your blood pressure three times a week  Please call the office if your blood pressure runs high  Please call us if you have new issues that need to be addressed before your next appt.  Your physician wants you to follow-up in: 3 months.

## 2013-11-09 NOTE — Assessment & Plan Note (Signed)
She reports having worse hypertension and doctor's offices. By her history, it would appear she has labile hypertension. In this case, may be better to run her blood pressure mildly high and avoid hypotensive episodes

## 2013-11-09 NOTE — Assessment & Plan Note (Addendum)
Unable to exclude gastroparesis. She seems to have frequent problems with nausea and vomiting. We'll try to obtain most recent digoxin level. Could potentially hold the digoxin for a trial period  to see if this helps her nausea symptoms

## 2013-11-09 NOTE — Assessment & Plan Note (Signed)
Recommended that she continue to use her walker. History of falls

## 2013-11-09 NOTE — Progress Notes (Signed)
Patient ID: [name], female    DOB: 05-28-1921, 80 y.o.   MRN: [id number]  HPI Comments:  80 year old woman with a past history of labile hypertension and hypercholesterolemia,  history of severe back pain secondary to spinal stenosis, chronic issues with dizziness nausea and profound weakness who presents to establish care in the [place] office.  In followup today, she reports that her clonidine was held recently and started on methyldopa for blood pressure control. She did not tolerate higher doses of clonidine. On the methyldopa, she reports that she has worsening abdominal symptoms including nausea and vomiting. She has significant malaise and fatigue. After several doses, she stopped the medications and does not want anymore. She would like to go back on her clonidine. She states that she was taking one half clonidine in the morning, lunch, whole clonidine in the evening and this controlled her blood pressure better than anything. She did not have significant side effects until he dose was increased.  She does not want to try any other medications at this time.  Some presents with her today and reports that her gait is unsteady. She does have a history of falls. She uses a walker at home, sometimes walks away from her walker and uses nothing. She has had problems with blurry vision, dizziness  Orthostatics done in the office showed systolic pressure 517/61 supine, 169/91 sitting, standing 158/94. Blood pressure increased from 83 up to 100 with standing  normal nuclear stress test in March 2011.   echocardiogram in 2009 showing impaired relaxation and mild aortic sclerosis.   hospitalized at [facility] from 09/12/12 until 09/15/12.  She was hospitalized because of difficulty walking, found to have orthostatic hypotension as well as severe hypertension at times.  She had an MRI which was negative.    EKG shows normal sinus rhythm with rate 88 beats per minute, left axis  deviation    Outpatient Encounter Prescriptions as of 11/09/2013  Medication Sig  . beclomethasone (BECONASE-AQ) 42 MCG/SPRAY nasal spray Place into both nostrils 2 (two) times daily. Dose is for each nostril.  . cetirizine (ZYRTEC) 10 MG tablet Take 10 mg by mouth daily as needed for allergies. As needed  . digoxin (LANOXIN) 0.125 MG tablet Take 0.125 mg by mouth daily.  Marland Kitchen dimenhyDRINATE (DRAMAMINE) 50 MG tablet Take 50 mg by mouth every 8 (eight) hours as needed (for nausea).  . famotidine (PEPCID) 20 MG tablet Take 20 mg by mouth as needed.   . fluticasone (FLONASE) 50 MCG/ACT nasal spray Place 1 spray into both nostrils daily.   . nitroGLYCERIN (NITROSTAT) 0.4 MG SL tablet Place 0.4 mg under the tongue every 5 (five) minutes as needed for chest pain.  Marland Kitchen ondansetron (ZOFRAN-ODT) 4 MG disintegrating tablet Take 4 mg by mouth every 4 (four) hours as needed for nausea or vomiting.  . sodium chloride (OCEAN) 0.65 % nasal spray Place 1 spray into the nose as needed for congestion.  . [DISCONTINUED] methyldopa (ALDOMET) 250 MG tablet Take 250 mg by mouth 3 (three) times daily.  . cloNIDine (CATAPRES) 0.1 MG tablet Take 1 tablet (0.1 mg total) by mouth 2 (two) times daily.    Review of Systems  Constitutional: Negative.   HENT: Negative.   Eyes: Negative.   Respiratory: Negative.   Cardiovascular: Negative.   Gastrointestinal: Positive for nausea.  Endocrine: Negative.   Musculoskeletal: Positive for gait problem.  Skin: Negative.   Allergic/Immunologic: Negative.   Neurological: Positive for dizziness.  Hematological:  Negative.   Psychiatric/Behavioral: Negative.   All other systems reviewed and are negative.   BP 154/90  Pulse 88  Ht 5' (1.524 m)  Wt 115 lb 4 oz (52.277 kg)  BMI 22.51 kg/m2  Physical Exam  Nursing note and vitals reviewed. Constitutional: She is oriented to person, place, and time. She appears well-developed and well-nourished.  HENT:  Head: Normocephalic.   Nose: Nose normal.  Mouth/Throat: Oropharynx is clear and moist.  Eyes: Conjunctivae are normal. Pupils are equal, round, and reactive to light.  Neck: Normal range of motion. Neck supple. No JVD present.  Cardiovascular: Normal rate, regular rhythm, S1 normal, S2 normal and intact distal pulses.  Exam reveals no gallop and no friction rub.   Murmur heard.  Systolic murmur is present with a grade of 2/6  Pulmonary/Chest: Effort normal and breath sounds normal. No respiratory distress. She has no wheezes. She has no rales. She exhibits no tenderness.  Abdominal: Soft. Bowel sounds are normal. She exhibits no distension. There is no tenderness.  Musculoskeletal: Normal range of motion. She exhibits no edema and no tenderness.  Lymphadenopathy:    She has no cervical adenopathy.  Neurological: She is alert and oriented to person, place, and time. Coordination normal.  Skin: Skin is warm and dry. No rash noted. No erythema.  Psychiatric: She has a normal mood and affect. Her behavior is normal. Judgment and thought content normal.    Assessment and Plan

## 2013-11-12 ENCOUNTER — Telehealth: Payer: Self-pay

## 2013-11-12 NOTE — Telephone Encounter (Signed)
Spoke w/ pt.  She reports that she cannot take clonidine and would like it added to her extensive list of allergies, along w/ methyldopa.   Reports that "it blew up my face" and "did it so fast". Reports BP this am 160/83, but states that she didn't sit still long enough and would like to repeat it.  Advised pt against checking her BP so often, as this was discussed at her last ov.  She is agreeable.  Reports that she and Dr. Rockey Situ had discussed alternative meds:  "other alternative would be to take hydralazine when necessary. Other medications that have not been tried might include low dose of Cardura or short acting diltiazem/verapamil" Please advise.  Thank you.

## 2013-11-12 NOTE — Telephone Encounter (Signed)
Pt called and needs to talk about her medications. States she was not able to go back on the Clonidine.

## 2013-11-13 MED ORDER — DOXAZOSIN MESYLATE 4 MG PO TABS: 2.0000 mg | ORAL_TABLET | Freq: Every day | ORAL | Status: DC

## 2013-11-13 NOTE — Telephone Encounter (Signed)
Spoke w/ pt.  Advised her of Dr. Gollan's recommendation. She is agreeable and will call w/ further questions or concerns.  

## 2013-11-13 NOTE — Telephone Encounter (Signed)
Could try cardura 2 mg a day at night If BP running high, would increase up to 4 mg a day (could give 4 mg pill and cut in 1/2 to start)

## 2013-11-15 ENCOUNTER — Telehealth: Payer: Self-pay

## 2013-11-15 NOTE — Telephone Encounter (Signed)
Spoke w/ pt. She reports that he has had a reaction to cardura and would like this added to her allergy list.  Reports increased HR, felt "deathly sick", nauseous, HA, achy all over, blurred vision, and weakness. States that she was so weak that she could not get out of bed for 13 hours. BP on getting up, 151/76.  States that she feels that she has been "beat 1/2 to death". Please advise.  Thank you.

## 2013-11-15 NOTE — Telephone Encounter (Signed)
Pt called and states the new rx Doxazosin mesylate that was given to her, she has had a "worse reaction" states her heart was racing real fast, difficulty breathing, and seeing, and ached all over. Please call.

## 2013-11-16 NOTE — Telephone Encounter (Signed)
We are running out of medication options We'll need to review her allergy list Possibly even retry some of the medications from the allergy list

## 2013-11-19 NOTE — Telephone Encounter (Signed)
Scheduled patient to see Dr. Rockey Situ to discuss her medications  Informed her to call with any issues between now and then  Patient verbalized understanding

## 2013-12-03 ENCOUNTER — Ambulatory Visit (INDEPENDENT_AMBULATORY_CARE_PROVIDER_SITE_OTHER): Payer: Medicare Other | Admitting: Cardiovascular Disease

## 2013-12-03 ENCOUNTER — Encounter: Payer: Self-pay | Admitting: Cardiovascular Disease

## 2013-12-03 VITALS — BP 172/90 | HR 76 | Ht 60.0 in | Wt 115.0 lb

## 2013-12-03 DIAGNOSIS — R110 Nausea: Secondary | ICD-10-CM

## 2013-12-03 DIAGNOSIS — R269 Unspecified abnormalities of gait and mobility: Secondary | ICD-10-CM | POA: Diagnosis not present

## 2013-12-03 DIAGNOSIS — I10 Essential (primary) hypertension: Secondary | ICD-10-CM

## 2013-12-03 DIAGNOSIS — R2681 Unsteadiness on feet: Secondary | ICD-10-CM

## 2013-12-03 MED ORDER — HYDRALAZINE HCL 10 MG PO TABS: 10.0000 mg | ORAL_TABLET | Freq: Three times a day (TID) | ORAL | Status: DC | PRN

## 2013-12-03 NOTE — Assessment & Plan Note (Signed)
Currently uses a walker. History of falls

## 2013-12-03 NOTE — Patient Instructions (Signed)
You are doing well. Your blood pressures are in a range of 140s to 160s Please take hydralazine as needed for blood pressure for numbers greater than 180  Please call us if you have new issues that need to be addressed before your next appt.  Your physician wants you to follow-up in: 6 months.  You will receive a reminder letter in the mail two months in advance. If you don't receive a letter, please call our office to schedule the follow-up appointment.

## 2013-12-03 NOTE — Progress Notes (Signed)
Patient ID: [name], female    DOB: 03-Mar-1921, 80 y.o.   MRN: [id number]  HPI Comments: 80 year old woman with a past history of labile hypertension and hypercholesterolemia,  history of severe back pain secondary to spinal stenosis, chronic issues with dizziness nausea and profound weakness previously seen for management of her blood pressure who presents for routine followup.  On her prior clinic visit she was taking methyldopa and felt that she was having side effects including nausea and vomiting. Also with malaise and fatigue. She requested to go back on clonidine. Clonidine was restarted at a low dose as she was taking previously. She states having symptoms on a low dose clonidine despite having taken this before. Again symptoms as above. We changed to 2 Cardura 2 mg in the evening. She didn't have side effects on the medication.  She currently does not take any blood pressure medication. She brings blood pressure measurements from home and they range from 553 systolic up to 748. Diastolic in the 27-07 range. She does not really want any medications as they all seem to have side effects She is otherwise active, some unsteady gait. History of falls  She uses a walker at home, sometimes walks away from her walker and uses nothing. She has had problems with blurry vision, dizziness  normal nuclear stress test in March 2011.   echocardiogram in 2009 showing impaired relaxation and mild aortic sclerosis.   hospitalized at [facility] from 09/12/12 until 09/15/12.  She was hospitalized because of difficulty walking, found to have orthostatic hypotension as well as severe hypertension at times.  She had an MRI which was negative.      Outpatient Encounter Prescriptions as of 12/03/2013  Medication Sig  . beclomethasone (BECONASE-AQ) 42 MCG/SPRAY nasal spray Place into both nostrils 2 (two) times daily. Dose is for each nostril.  . cetirizine (ZYRTEC) 10 MG tablet Take 10 mg by  mouth daily as needed for allergies. As needed  . digoxin (LANOXIN) 0.125 MG tablet Take 0.125 mg by mouth daily.  Marland Kitchen dimenhyDRINATE (DRAMAMINE) 50 MG tablet Take 50 mg by mouth every 8 (eight) hours as needed (for nausea).  . famotidine (PEPCID) 20 MG tablet Take 20 mg by mouth as needed.   . fluticasone (FLONASE) 50 MCG/ACT nasal spray Place 1 spray into both nostrils daily.   . nitroGLYCERIN (NITROSTAT) 0.4 MG SL tablet Place 0.4 mg under the tongue every 5 (five) minutes as needed for chest pain.  Marland Kitchen ondansetron (ZOFRAN-ODT) 4 MG disintegrating tablet Take 4 mg by mouth every 4 (four) hours as needed for nausea or vomiting.  . sodium chloride (OCEAN) 0.65 % nasal spray Place 1 spray into the nose as needed for congestion.    Review of Systems  Constitutional: Negative.   HENT: Negative.   Eyes: Negative.   Respiratory: Negative.   Cardiovascular: Negative.   Gastrointestinal: Positive for nausea.  Endocrine: Negative.   Musculoskeletal: Positive for gait problem.  Skin: Negative.   Allergic/Immunologic: Negative.   Neurological: Positive for dizziness.  Hematological: Negative.   Psychiatric/Behavioral: Negative.   All other systems reviewed and are negative.   BP 172/90  Pulse 76  Ht 5' (1.524 m)  Wt 115 lb (52.164 kg)  BMI 22.46 kg/m2  Physical Exam  Nursing note and vitals reviewed. Constitutional: She is oriented to person, place, and time. She appears well-developed and well-nourished.  HENT:  Head: Normocephalic.  Nose: Nose normal.  Mouth/Throat: Oropharynx is clear and moist.  Eyes: Conjunctivae are normal. Pupils are equal, round, and reactive to light.  Neck: Normal range of motion. Neck supple. No JVD present.  Cardiovascular: Normal rate, regular rhythm, S1 normal, S2 normal and intact distal pulses.  Exam reveals no gallop and no friction rub.   Murmur heard.  Systolic murmur is present with a grade of 2/6  Pulmonary/Chest: Effort normal and breath sounds  normal. No respiratory distress. She has no wheezes. She has no rales. She exhibits no tenderness.  Abdominal: Soft. Bowel sounds are normal. She exhibits no distension. There is no tenderness.  Musculoskeletal: Normal range of motion. She exhibits no edema and no tenderness.  Lymphadenopathy:    She has no cervical adenopathy.  Neurological: She is alert and oriented to person, place, and time. Coordination normal.  Skin: Skin is warm and dry. No rash noted. No erythema.  Psychiatric: She has a normal mood and affect. Her behavior is normal. Judgment and thought content normal.    Assessment and Plan

## 2013-12-03 NOTE — Assessment & Plan Note (Signed)
We have discussed her blood pressure medications with her. It would seem that every pill that she takes, she has side effects. Systolic pressures by her home numbers are in the 140-160 range. For now we have suggested not starting any medications on a regular basis as she does not tolerate any of these anyway. We will use hydralazine 10 mg when necessary for blood pressures in the 180 range or higher. She is happy with this. She does not want any additional medications on a regular basis

## 2013-12-03 NOTE — Assessment & Plan Note (Signed)
She continues to have episodes of nausea and GI distress. I suspect many of her allergies to medications are unrelated to the medications at all. She does not want additional medications for her stomach. She takes Pepcid which seems to help her symptoms

## 2013-12-06 DIAGNOSIS — H353190 Nonexudative age-related macular degeneration, unspecified eye, stage unspecified: Secondary | ICD-10-CM | POA: Diagnosis not present

## 2013-12-20 DIAGNOSIS — R42 Dizziness and giddiness: Secondary | ICD-10-CM | POA: Diagnosis not present

## 2013-12-20 DIAGNOSIS — J309 Allergic rhinitis, unspecified: Secondary | ICD-10-CM | POA: Diagnosis not present

## 2013-12-20 DIAGNOSIS — I10 Essential (primary) hypertension: Secondary | ICD-10-CM | POA: Diagnosis not present

## 2013-12-20 DIAGNOSIS — Z8719 Personal history of other diseases of the digestive system: Secondary | ICD-10-CM | POA: Diagnosis not present

## 2013-12-26 ENCOUNTER — Ambulatory Visit: Payer: Medicare Other | Admitting: Podiatry

## 2014-01-08 DIAGNOSIS — L578 Other skin changes due to chronic exposure to nonionizing radiation: Secondary | ICD-10-CM | POA: Diagnosis not present

## 2014-01-08 DIAGNOSIS — L820 Inflamed seborrheic keratosis: Secondary | ICD-10-CM | POA: Diagnosis not present

## 2014-01-08 DIAGNOSIS — L821 Other seborrheic keratosis: Secondary | ICD-10-CM | POA: Diagnosis not present

## 2014-01-08 DIAGNOSIS — Z85828 Personal history of other malignant neoplasm of skin: Secondary | ICD-10-CM | POA: Diagnosis not present

## 2014-01-11 ENCOUNTER — Ambulatory Visit: Payer: Medicare Other | Admitting: Podiatry

## 2014-01-16 ENCOUNTER — Encounter: Payer: Self-pay | Admitting: Podiatry

## 2014-01-16 ENCOUNTER — Ambulatory Visit (INDEPENDENT_AMBULATORY_CARE_PROVIDER_SITE_OTHER): Payer: Medicare Other | Admitting: Podiatry

## 2014-01-16 DIAGNOSIS — B351 Tinea unguium: Secondary | ICD-10-CM | POA: Diagnosis not present

## 2014-01-16 DIAGNOSIS — M79609 Pain in unspecified limb: Secondary | ICD-10-CM

## 2014-01-16 DIAGNOSIS — M79676 Pain in unspecified toe(s): Secondary | ICD-10-CM

## 2014-01-16 NOTE — Progress Notes (Signed)
She presents today with a chief complaint of painful elongated toenails one through 5 bilateral.  Objective: Pulses remain palpable bilateral. Nails are thick yellow dystrophic onychomycotic and painful palpation.  Assessment: Pain in limb secondary to onychomycosis 1 through 5 bilateral.  Plan: Debridement of nails 1 through 5 bilateral covered service secondary to pain.

## 2014-01-25 DIAGNOSIS — K117 Disturbances of salivary secretion: Secondary | ICD-10-CM | POA: Diagnosis not present

## 2014-01-25 DIAGNOSIS — Q248 Other specified congenital malformations of heart: Secondary | ICD-10-CM | POA: Diagnosis not present

## 2014-01-25 DIAGNOSIS — R05 Cough: Secondary | ICD-10-CM | POA: Diagnosis not present

## 2014-01-25 DIAGNOSIS — R059 Cough, unspecified: Secondary | ICD-10-CM | POA: Diagnosis not present

## 2014-01-25 DIAGNOSIS — I10 Essential (primary) hypertension: Secondary | ICD-10-CM | POA: Diagnosis not present

## 2014-02-08 ENCOUNTER — Ambulatory Visit: Payer: Medicare Other | Admitting: Cardiovascular Disease

## 2014-03-27 ENCOUNTER — Ambulatory Visit: Payer: Medicare Other | Admitting: Podiatry

## 2014-03-27 DIAGNOSIS — M79676 Pain in unspecified toe(s): Secondary | ICD-10-CM

## 2014-03-27 DIAGNOSIS — B351 Tinea unguium: Secondary | ICD-10-CM

## 2014-03-27 DIAGNOSIS — M79609 Pain in unspecified limb: Secondary | ICD-10-CM

## 2014-03-27 NOTE — Progress Notes (Signed)
Presents today chief complaint of painful elongated toenails.  Objective: Pulses are palpable bilateral nails are thick, yellow dystrophic onychomycosis and painful palpation.   Assessment: Onychomycosis with pain in limb.  Plan: Treatment of nails in thickness and length as covered service secondary to pain.  

## 2014-03-31 DIAGNOSIS — Z23 Encounter for immunization: Secondary | ICD-10-CM | POA: Diagnosis not present

## 2014-04-09 ENCOUNTER — Observation Stay: Payer: Self-pay | Admitting: Internal Medicine

## 2014-04-09 DIAGNOSIS — E785 Hyperlipidemia, unspecified: Secondary | ICD-10-CM | POA: Diagnosis not present

## 2014-04-09 DIAGNOSIS — K219 Gastro-esophageal reflux disease without esophagitis: Secondary | ICD-10-CM | POA: Diagnosis not present

## 2014-04-09 DIAGNOSIS — Z66 Do not resuscitate: Secondary | ICD-10-CM | POA: Diagnosis not present

## 2014-04-09 DIAGNOSIS — Z91128 Patient's intentional underdosing of medication regimen for other reason: Secondary | ICD-10-CM | POA: Diagnosis not present

## 2014-04-09 DIAGNOSIS — T465X6A Underdosing of other antihypertensive drugs, initial encounter: Secondary | ICD-10-CM | POA: Diagnosis not present

## 2014-04-09 DIAGNOSIS — M1990 Unspecified osteoarthritis, unspecified site: Secondary | ICD-10-CM | POA: Diagnosis not present

## 2014-04-09 DIAGNOSIS — F419 Anxiety disorder, unspecified: Secondary | ICD-10-CM | POA: Diagnosis not present

## 2014-04-09 DIAGNOSIS — E780 Pure hypercholesterolemia: Secondary | ICD-10-CM | POA: Diagnosis not present

## 2014-04-09 DIAGNOSIS — Z882 Allergy status to sulfonamides status: Secondary | ICD-10-CM | POA: Diagnosis not present

## 2014-04-09 DIAGNOSIS — I10 Essential (primary) hypertension: Secondary | ICD-10-CM | POA: Diagnosis not present

## 2014-04-09 DIAGNOSIS — Z881 Allergy status to other antibiotic agents status: Secondary | ICD-10-CM | POA: Diagnosis not present

## 2014-04-09 DIAGNOSIS — Z886 Allergy status to analgesic agent status: Secondary | ICD-10-CM | POA: Diagnosis not present

## 2014-04-09 DIAGNOSIS — D72829 Elevated white blood cell count, unspecified: Secondary | ICD-10-CM | POA: Diagnosis not present

## 2014-04-09 DIAGNOSIS — I341 Nonrheumatic mitral (valve) prolapse: Secondary | ICD-10-CM | POA: Diagnosis not present

## 2014-04-09 DIAGNOSIS — Z888 Allergy status to other drugs, medicaments and biological substances status: Secondary | ICD-10-CM | POA: Diagnosis not present

## 2014-04-09 DIAGNOSIS — Z880 Allergy status to penicillin: Secondary | ICD-10-CM | POA: Diagnosis not present

## 2014-04-09 DIAGNOSIS — Z885 Allergy status to narcotic agent status: Secondary | ICD-10-CM | POA: Diagnosis not present

## 2014-04-09 LAB — CBC WITH DIFFERENTIAL/PLATELET
BASOS ABS: 0.1 10*3/uL (ref 0.0–0.1)
Basophil %: 0.5 %
Eosinophil #: 0.4 10*3/uL (ref 0.0–0.7)
Eosinophil %: 3.6 %
HCT: 41.7 % (ref 35.0–47.0)
HGB: 13.8 g/dL (ref 12.0–16.0)
Lymphocyte #: 4.4 10*3/uL — ABNORMAL HIGH (ref 1.0–3.6)
Lymphocyte %: 35.9 %
MCH: 31.5 pg (ref 26.0–34.0)
MCHC: 33.1 g/dL (ref 32.0–36.0)
MCV: 95 fL (ref 80–100)
MONOS PCT: 6.4 %
Monocyte #: 0.8 x10 3/mm (ref 0.2–0.9)
NEUTROS ABS: 6.6 10*3/uL — AB (ref 1.4–6.5)
Neutrophil %: 53.6 %
Platelet: 226 10*3/uL (ref 150–440)
RBC: 4.38 10*6/uL (ref 3.80–5.20)
RDW: 13 % (ref 11.5–14.5)
WBC: 12.3 10*3/uL — ABNORMAL HIGH (ref 3.6–11.0)

## 2014-04-09 LAB — BASIC METABOLIC PANEL
ANION GAP: 10 (ref 7–16)
BUN: 18 mg/dL (ref 7–18)
CALCIUM: 8.3 mg/dL — AB (ref 8.5–10.1)
Chloride: 106 mmol/L (ref 98–107)
Co2: 23 mmol/L (ref 21–32)
Creatinine: 0.82 mg/dL (ref 0.60–1.30)
EGFR (Non-African Amer.): >60
Glucose: 91 mg/dL (ref 65–99)
Osmolality: 279 (ref 275–301)
POTASSIUM: 3.7 mmol/L (ref 3.5–5.1)
Sodium: 139 mmol/L (ref 136–145)

## 2014-04-09 LAB — TROPONIN I
Troponin-I: 0.03 ng/mL
Troponin-I: 0.04 ng/mL

## 2014-04-10 DIAGNOSIS — I10 Essential (primary) hypertension: Secondary | ICD-10-CM | POA: Diagnosis not present

## 2014-04-10 DIAGNOSIS — R079 Chest pain, unspecified: Secondary | ICD-10-CM

## 2014-04-10 LAB — TROPONIN I: TROPONIN-I: 0.05 ng/mL

## 2014-04-11 DIAGNOSIS — K219 Gastro-esophageal reflux disease without esophagitis: Secondary | ICD-10-CM | POA: Diagnosis not present

## 2014-04-11 DIAGNOSIS — I10 Essential (primary) hypertension: Secondary | ICD-10-CM | POA: Diagnosis not present

## 2014-04-11 DIAGNOSIS — R079 Chest pain, unspecified: Secondary | ICD-10-CM | POA: Diagnosis not present

## 2014-04-11 DIAGNOSIS — D72829 Elevated white blood cell count, unspecified: Secondary | ICD-10-CM | POA: Diagnosis not present

## 2014-04-11 LAB — CBC WITH DIFFERENTIAL/PLATELET
BASOS ABS: 0.1 10*3/uL (ref 0.0–0.1)
BASOS PCT: 0.4 %
Eosinophil #: 0.2 10*3/uL (ref 0.0–0.7)
Eosinophil %: 1.6 %
HCT: 38.8 % (ref 35.0–47.0)
HGB: 12.9 g/dL (ref 12.0–16.0)
LYMPHS ABS: 3.1 10*3/uL (ref 1.0–3.6)
Lymphocyte %: 25 %
MCH: 31.8 pg (ref 26.0–34.0)
MCHC: 33.2 g/dL (ref 32.0–36.0)
MCV: 96 fL (ref 80–100)
Monocyte #: 0.9 x10 3/mm (ref 0.2–0.9)
Monocyte %: 7.2 %
Neutrophil #: 8.3 10*3/uL — ABNORMAL HIGH (ref 1.4–6.5)
Neutrophil %: 65.8 %
Platelet: 228 10*3/uL (ref 150–440)
RBC: 4.06 10*6/uL (ref 3.80–5.20)
RDW: 13.2 % (ref 11.5–14.5)
WBC: 12.6 10*3/uL — AB (ref 3.6–11.0)

## 2014-04-12 ENCOUNTER — Telehealth: Payer: Self-pay

## 2014-04-12 NOTE — Telephone Encounter (Signed)
Patient contacted regarding discharge from [facility] on 04/11/14.  Patient understands to follow up with Christell Faith, PA on 04/19/14 at 1:45 at [facility]. Patient understands discharge instructions? yes Patient understands medications and regiment? yes Patient understands to bring all medications to this visit? yes

## 2014-04-15 ENCOUNTER — Encounter: Payer: Self-pay | Admitting: Physician Assistant

## 2014-04-18 ENCOUNTER — Encounter: Payer: Self-pay | Admitting: *Deleted

## 2014-04-19 ENCOUNTER — Encounter: Payer: Self-pay | Admitting: Physician Assistant

## 2014-04-19 ENCOUNTER — Ambulatory Visit (INDEPENDENT_AMBULATORY_CARE_PROVIDER_SITE_OTHER): Payer: Medicare Other | Admitting: Physician Assistant

## 2014-04-19 VITALS — BP 130/60 | HR 85 | Ht 60.0 in | Wt 117.8 lb

## 2014-04-19 DIAGNOSIS — F419 Anxiety disorder, unspecified: Secondary | ICD-10-CM

## 2014-04-19 DIAGNOSIS — R0602 Shortness of breath: Secondary | ICD-10-CM

## 2014-04-19 DIAGNOSIS — R079 Chest pain, unspecified: Secondary | ICD-10-CM

## 2014-04-19 DIAGNOSIS — I10 Essential (primary) hypertension: Secondary | ICD-10-CM

## 2014-04-19 DIAGNOSIS — Z789 Other specified health status: Secondary | ICD-10-CM

## 2014-04-19 DIAGNOSIS — R29898 Other symptoms and signs involving the musculoskeletal system: Secondary | ICD-10-CM

## 2014-04-19 NOTE — Patient Instructions (Signed)
Your physician recommends that you continue on your current medications as directed. Please refer to the Current Medication list given to you today.  [place]  Your caregiver has ordered a Stress Test with nuclear imaging. The purpose of this test is to evaluate the blood supply to your heart muscle. This procedure is referred to as a "Non-Invasive Stress Test." This is because other than having an IV started in your vein, nothing is inserted or "invades" your body. Cardiac stress tests are done to find areas of poor blood flow to the heart by determining the extent of coronary artery disease (CAD). Some patients exercise on a treadmill, which naturally increases the blood flow to your heart, while others who are  unable to walk on a treadmill due to physical limitations have a pharmacologic/chemical stress agent called Lexiscan . This medicine will mimic walking on a treadmill by temporarily increasing your coronary blood flow.   Please note: these test may take anywhere between 2-4 hours to complete  PLEASE REPORT TO [facility] AT THE FIRST DESK WILL DIRECT YOU WHERE TO GO  Date of Procedure:____Wednesday, November 11_________  Arrival Time for Procedure:______7:15 am ________________   PLEASE NOTIFY THE OFFICE AT LEAST 24 HOURS IN ADVANCE IF YOU ARE UNABLE TO KEEP YOUR APPOINTMENT.  [phone] AND  PLEASE NOTIFY NUCLEAR MEDICINE AT [facility] AT LEAST 24 HOURS IN ADVANCE IF YOU ARE UNABLE TO KEEP YOUR APPOINTMENT. [phone]  How to prepare for your Myoview test:  1. Do not eat or drink after midnight 2. No caffeine for 24 hours prior to test 3. No smoking 24 hours prior to test. 4. Your medication may be taken with water.  If your doctor stopped a medication because of this test, do not take that medication. 5. Ladies, please do not wear dresses.  Skirts or pants are appropriate. Please wear a short sleeve shirt. 6. No perfume, cologne or  lotion.  Please follow up with Dr. Rockey Situ in 3 months.  Your next appointment will be scheduled in our new office located at :  [place]  [address] [place]  [place], [place] [address]

## 2014-04-19 NOTE — Addendum Note (Signed)
Addended by: Dede Query R on: 04/19/2014 03:37 PM   Modules accepted: Orders

## 2014-04-19 NOTE — Progress Notes (Signed)
Patient Name: [name], [name] 1921/02/23, MRN [id number]  Date of Encounter: 04/19/2014  Primary Care Provider:  Coralie Common, MD Primary Cardiologist:  Dr. Rockey Situ, MD  Patient Profile:  80 y.o. female with history below presents for hospital follow up after recent admission to [facility] from 10/27-10/29 for hypertensive urgency, atypical chest pain, leg weakness, leukocytosis, and anxiety.    Problem List:   Past Medical History  Diagnosis Date  . Hypertension     a. labile HTN  . Atypical chest pain     a. nuclear stress test 04/2010: normal; b. echo 2009: EF 55-60%, mild LVH, impaired LV relaxation, mild aortic sclerosis, mild TR, nl PASP, since echo 2007 no significant change   . History of spinal stenosis     underwent back surgery on 12/03/09 by Dr. Glenna Fellows  . Hyperlipidemia   . Vertigo     positional vertigo  . Dizziness   . Muscle spasm   . Numbness in feet   . Dyspepsia   . Fatigue   . Headaches, cluster   . Joint pain   . Worsening vision   . Shortness of breath   . Heart palpitations   . Nausea and/or vomiting     with walking  . Hypercholesterolemia     with inability to tolerate statin therapy  . Mitral valve prolapse   . History of liver disease     history of liver trouble, has not had any known esposures to jaundiced person  . History of rheumatic fever     as a child  . 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 Medication side effects     a. 30+ medication allergies and/or intolerances; b. she will not take daily antihypertensives   Past Surgical History  Procedure Laterality Date  . Tonsillectomy    . Breast biopsy    . Bladder fulguration    . Cholecystectomy    . Total abdominal hysterectomy    . Hemorrhoid surgery    . Cervical fusion    . Carpal tunnel release      bilaterally  . Lumbar laminectomy       Allergies:  Allergies  Allergen Reactions  . Amlodipine Nausea Only  . Aspirin   . Bystolic  [Nebivolol Hcl]     Vision problem  . Calan [Verapamil Hcl]     Weakness and staggering  . Cardura [Doxazosin Mesylate]   . Cephalosporins   . Clonidine Derivatives     Facial swelling   . Codeine   . Cozaar   . Doxazosin   . Elavil [Amitriptyline Hcl]   . Erythromycin   . Gantrisin [Sulfisoxazole]   . Hctz [Hydrochlorothiazide]   . Iodides   . Lopressor [Metoprolol Tartrate]   . Methyldopa     Facial swelling   . Micardis [Telmisartan]   . Morphine And Related   . Nizatidine   . Nsaids   . Pantoprazole Sodium   . Penicillins   . Prilosec [Omeprazole]   . Promethazine Hcl   . Streptomycin   . Sulfa Drugs Cross Reactors   . Tequin   . Tetracyclines & Related   . Toprol Xl [Metoprolol Succinate]   . Ultram [Tramadol]   . Valium   . Valturna [Aliskiren-Valsartan] Nausea And Vomiting     HPI:  80 y.o. female with the above problem list presents for hospital follow up.   Patient with known history of labile hypertension, multiple medication intolerances (30+),  HLD, back pain 2/2 spinal stenosis, chronic issues with dizziness, nausea and profound weakness who was recently admitted to [facility] as above after undergoing oral surgery. She was found to have a BP of 218/113 at the oral surgeon's office at sent to [facility] for further evaluation. She has tried and failed numerous antihypertensives, as well antibiotics, nsaids, PPIs, pain medications, iodines, TCA, to name most. Her side effects have ranged from weakness, staggering, fatigue, malaise, flushing, nausea, vomiting, & facial swelling. Because of this her medications options have been limited and she has been quite reluctant to take medication. Her BP is quite labile, previously experiencing extreme highs and lows further complicating the picture. She did find a regimen that worked for her with prn hydralazine. She is active at home.   Upon her admission to [facility] she was found to have a BP of 188/82 (previously 218/113 at her oral  surgeon). She reported to [place] that she had stopped taking her prn hydralazine all together. She would rather feel bad than take any medication. She was treated with digoxin and hydralazine to a systolic BP of less than 409. She had a good BP response - although she was initially refusing hydralazine. She started taking later on in the day. BP improved to 175/75. She did complain of chest pain during her admission with negative TnI - felt to be atypical in nature.   Today she comes in stating that she feels well. BP at home has been in the 130-140/80s range. She is taking her hydralazine. She is happy with her current regimen and with her improvement in her BP. She does note intermittent chest heaviness and SOB, more notably when she talks for extended time periods. No associated diaphoresis, nausea, vomiting, presyncope, or syncope. Last nuclear study 2011 - normal. Echo 2009 showed impaired relaxation and mild aortic sclerosis.     Home Medications:  Prior to Admission medications   Medication Sig Start Date End Date Taking? Authorizing Provider  Azelastine HCl (ASTEPRO NA) Place into the nose as needed.    Historical Provider, MD  beclomethasone (BECONASE-AQ) 42 MCG/SPRAY nasal spray Place into both nostrils 2 (two) times daily. Dose is for each nostril.    Historical Provider, MD  cetirizine (ZYRTEC) 10 MG tablet Take 10 mg by mouth daily as needed for allergies. As needed    Historical Provider, MD  digoxin (LANOXIN) 0.125 MG tablet Take 0.125 mg by mouth daily.    Historical Provider, MD  dimenhyDRINATE (DRAMAMINE) 50 MG tablet Take 50 mg by mouth every 8 (eight) hours as needed (for nausea).    Historical Provider, MD  famotidine (PEPCID) 20 MG tablet Take 20 mg by mouth as needed.     Historical Provider, MD  fluticasone (FLONASE) 50 MCG/ACT nasal spray Place 1 spray into both nostrils daily.  11/02/13   Historical Provider, MD  hydrALAZINE (APRESOLINE) 10 MG tablet Take 1 tablet (10 mg total)  by mouth 3 (three) times daily as needed. 12/03/13   Minna Merritts, MD  nitroGLYCERIN (NITROSTAT) 0.4 MG SL tablet Place 0.4 mg under the tongue every 5 (five) minutes as needed for chest pain.    Historical Provider, MD  ondansetron (ZOFRAN-ODT) 4 MG disintegrating tablet Take 4 mg by mouth every 4 (four) hours as needed for nausea or vomiting.    Historical Provider, MD  sodium chloride (OCEAN) 0.65 % nasal spray Place 1 spray into the nose as needed for congestion.    Historical Provider, MD  Weights: Filed Weights   04/19/14 1354  Weight: 117 lb 12 oz (53.411 kg)     Review of Systems:  All other systems reviewed and are otherwise negative except as noted above.  Physical Exam:  Blood pressure 130/60, pulse 85, height 5' (1.524 m), weight 117 lb 12 oz (53.411 kg).  General: Pleasant, NAD Psych: Normal affect. Neuro: Alert and oriented X 3. Moves all extremities spontaneously. HEENT: Normal  Neck: Supple without bruits or JVD. Lungs:  Resp regular and unlabored, CTA. Heart: RRR no s3, s4, or murmurs. Abdomen: Soft, non-tender, non-distended, BS + x 4.  Extremities: No clubbing, cyanosis or edema. DP/PT/Radials 2+ and equal bilaterally.   Accessory Clinical Findings:  EKG - NSR, 85, left axis deviation, left anterior fascicular block, 1 mm flat st depression lateral leads  Assessment & Plan:  1. Hypertension: -Well controlled today and per her report has been better controlled at home since her discharge from [facility] -Continue hydralazine 10 mg tid prn -Has a home health aid that takes her BP objectively  -She is pleased with her current BP regimen   2. Chest heaviness/SOB: -Last nuclear stress test 2011, normal -Schedule Lexiscan Myoview to evaluate for high risk ischemia - she is aware of the adverse effects -Offered cardiac cath, patient declined   3. Numerous medication intolerances: -Reported history of weakness, flushing, nausea to name a few with various  medications  -This has limited her treatment options and she is aware of this  4. Leg weakness: -Uses a walker  5. Anxiety: -States during her office visit today that she brings on a lot of her problems herself and that she gets herself worked up. She reports that she tries to not get so worked up but has a difficult time.  -Perhaps she would benefit from formal psychology evaluation    Christell Faith, PA-C [place] [address] [place], [place] [address] [phone] [place] 04/19/2014, 2:49 PM

## 2014-04-24 ENCOUNTER — Ambulatory Visit: Payer: Self-pay | Admitting: Cardiovascular Disease

## 2014-04-24 DIAGNOSIS — R0602 Shortness of breath: Secondary | ICD-10-CM | POA: Diagnosis not present

## 2014-04-24 DIAGNOSIS — R079 Chest pain, unspecified: Secondary | ICD-10-CM | POA: Diagnosis not present

## 2014-04-25 DIAGNOSIS — T887XXA Unspecified adverse effect of drug or medicament, initial encounter: Secondary | ICD-10-CM | POA: Insufficient documentation

## 2014-04-25 DIAGNOSIS — R0789 Other chest pain: Secondary | ICD-10-CM | POA: Insufficient documentation

## 2014-05-01 ENCOUNTER — Other Ambulatory Visit: Payer: Self-pay

## 2014-05-01 DIAGNOSIS — R0602 Shortness of breath: Secondary | ICD-10-CM

## 2014-05-01 DIAGNOSIS — R079 Chest pain, unspecified: Secondary | ICD-10-CM

## 2014-05-02 ENCOUNTER — Other Ambulatory Visit: Payer: Self-pay | Admitting: Physician Assistant

## 2014-05-02 DIAGNOSIS — I5022 Chronic systolic (congestive) heart failure: Secondary | ICD-10-CM

## 2014-05-07 DIAGNOSIS — K219 Gastro-esophageal reflux disease without esophagitis: Secondary | ICD-10-CM | POA: Diagnosis not present

## 2014-05-07 DIAGNOSIS — Z1389 Encounter for screening for other disorder: Secondary | ICD-10-CM | POA: Diagnosis not present

## 2014-05-07 DIAGNOSIS — Z9181 History of falling: Secondary | ICD-10-CM | POA: Diagnosis not present

## 2014-05-07 DIAGNOSIS — I10 Essential (primary) hypertension: Secondary | ICD-10-CM | POA: Diagnosis not present

## 2014-05-07 DIAGNOSIS — R42 Dizziness and giddiness: Secondary | ICD-10-CM | POA: Diagnosis not present

## 2014-05-07 DIAGNOSIS — I519 Heart disease, unspecified: Secondary | ICD-10-CM | POA: Diagnosis not present

## 2014-05-10 DIAGNOSIS — F419 Anxiety disorder, unspecified: Secondary | ICD-10-CM

## 2014-05-10 DIAGNOSIS — R29898 Other symptoms and signs involving the musculoskeletal system: Secondary | ICD-10-CM

## 2014-05-10 DIAGNOSIS — R0602 Shortness of breath: Secondary | ICD-10-CM

## 2014-05-10 DIAGNOSIS — Z889 Allergy status to unspecified drugs, medicaments and biological substances status: Secondary | ICD-10-CM

## 2014-05-10 DIAGNOSIS — R079 Chest pain, unspecified: Secondary | ICD-10-CM | POA: Diagnosis not present

## 2014-05-10 DIAGNOSIS — I10 Essential (primary) hypertension: Secondary | ICD-10-CM | POA: Diagnosis not present

## 2014-05-15 DIAGNOSIS — S8002XA Contusion of left knee, initial encounter: Secondary | ICD-10-CM | POA: Diagnosis not present

## 2014-05-21 ENCOUNTER — Other Ambulatory Visit (INDEPENDENT_AMBULATORY_CARE_PROVIDER_SITE_OTHER): Payer: Medicare Other

## 2014-05-21 ENCOUNTER — Other Ambulatory Visit: Payer: Self-pay

## 2014-05-21 DIAGNOSIS — I5022 Chronic systolic (congestive) heart failure: Secondary | ICD-10-CM | POA: Diagnosis not present

## 2014-05-26 ENCOUNTER — Other Ambulatory Visit: Payer: Self-pay | Admitting: Cardiovascular Disease

## 2014-06-04 ENCOUNTER — Ambulatory Visit: Payer: Medicare Other | Admitting: Cardiovascular Disease

## 2014-06-06 ENCOUNTER — Ambulatory Visit (INDEPENDENT_AMBULATORY_CARE_PROVIDER_SITE_OTHER): Payer: Medicare Other | Admitting: Cardiovascular Disease

## 2014-06-06 ENCOUNTER — Encounter: Payer: Self-pay | Admitting: Cardiovascular Disease

## 2014-06-06 VITALS — BP 160/72 | HR 82 | Ht 62.0 in | Wt 115.8 lb

## 2014-06-06 DIAGNOSIS — R2681 Unsteadiness on feet: Secondary | ICD-10-CM | POA: Diagnosis not present

## 2014-06-06 DIAGNOSIS — I10 Essential (primary) hypertension: Secondary | ICD-10-CM

## 2014-06-06 DIAGNOSIS — R0789 Other chest pain: Secondary | ICD-10-CM | POA: Diagnosis not present

## 2014-06-06 MED ORDER — HYDRALAZINE HCL 10 MG PO TABS: 10.0000 mg | ORAL_TABLET | Freq: Four times a day (QID) | ORAL | Status: DC

## 2014-06-06 NOTE — Assessment & Plan Note (Signed)
She is taking hydralazine 10 mg 3 times a day on a regular basis. Blood pressure mildly elevated on today's visit but she does not want any additional medication. We have recommended if blood pressure does go higher, that she take extra hydralazine 10 mg when necessary

## 2014-06-06 NOTE — Assessment & Plan Note (Signed)
Significant gait instability, leg weakness. Recent fall, now walks with a walker

## 2014-06-06 NOTE — Progress Notes (Signed)
Patient ID: [name], female    DOB: [birth year], 80 y.o.   MRN: [id number]  HPI Comments: 80 year old woman with a past history of labile hypertension and hypercholesterolemia, significant medication allergies, history of severe back pain secondary to spinal stenosis, chronic issues with dizziness nausea and profound weakness previously seen for management of her blood pressure. History of falls Recent echocardiogram showing normal ejection fraction, stress test showing no ischemia   recently admitted to [facility] as above after undergoing oral surgery. She was found to have a BP of 218/113 at the oral surgeon's office at sent to [facility] for further evaluation. found to have a BP of 188/82 (previously 218/113 at her oral surgeon).  she had stopped taking her prn hydralazine all together.   She did complain of chest pain during her admission with negative TnI - felt to be atypical in nature.   In follow-up today, she is taking hydralazine 10 mg 3 times a day. She reports that her systolic pressures typically 140 up to 150 on a regular basis Legs are weak, she walks minimally outside the house. One recent fall, twisted her left knee. Now using a walker Son presents with her today She has chronic shortness of breath, feels tired when walking  Other past medical history On a prior clinic visit she was taking methyldopa and felt that she was having side effects including nausea and vomiting. Also with malaise and fatigue. She requested to go back on clonidine. Clonidine was restarted at a low dose as she was taking previously. She states having symptoms on a low dose clonidine despite having taken this before. Again symptoms as above. We changed to Cardura 2 mg in the evening. She didn't have side effects on the medication.   normal nuclear stress test in March 2011.   echocardiogram in 2009 showing impaired relaxation and mild aortic sclerosis.   hospitalized at [facility] from 09/12/12 until  09/15/12.  She was hospitalized because of difficulty walking, found to have orthostatic hypotension as well as severe hypertension at times.  She had an MRI which was negative.     Allergies  Allergen Reactions  . Amlodipine Nausea Only  . Aspirin   . Bystolic [Nebivolol Hcl]     Vision problem  . Calan [Verapamil Hcl]     Weakness and staggering  . Cardura [Doxazosin Mesylate]   . Cephalosporins   . Clonidine Derivatives     Facial swelling   . Codeine   . Cozaar   . Doxazosin   . Elavil [Amitriptyline Hcl]   . Erythromycin   . Gantrisin [Sulfisoxazole]   . Hctz [Hydrochlorothiazide]   . Iodides   . Lopressor [Metoprolol Tartrate]   . Methyldopa     Facial swelling   . Micardis [Telmisartan]   . Morphine And Related   . Nizatidine   . Nsaids   . Pantoprazole Sodium   . Penicillins   . Prilosec [Omeprazole]   . Promethazine Hcl   . Streptomycin   . Sulfa Drugs Cross Reactors   . Tequin   . Tetracyclines & Related   . Toprol Xl [Metoprolol Succinate]   . Ultram [Tramadol]   . Valium   . Valturna [Aliskiren-Valsartan] Nausea And Vomiting    Outpatient Encounter Prescriptions as of 06/06/2014  Medication Sig  . beclomethasone (BECONASE-AQ) 42 MCG/SPRAY nasal spray Place into both nostrils 2 (two) times daily. Dose is for each nostril.  . cetirizine (ZYRTEC) 10 MG tablet Take 10 mg  by mouth daily as needed for allergies. As needed  . digoxin (LANOXIN) 0.125 MG tablet Take 0.125 mg by mouth daily.  Marland Kitchen dimenhyDRINATE (DRAMAMINE) 50 MG tablet Take 50 mg by mouth every 8 (eight) hours as needed (for nausea).  . famotidine (PEPCID) 20 MG tablet Take 20 mg by mouth as needed.   . fluticasone (FLONASE) 50 MCG/ACT nasal spray Place 1 spray into both nostrils daily.   . hydrALAZINE (APRESOLINE) 10 MG tablet Take 1 tablet (10 mg total) by mouth 4 (four) times daily.  . nitroGLYCERIN (NITROSTAT) 0.4 MG SL tablet Place 0.4 mg under the tongue every 5 (five) minutes as needed for  chest pain.  Marland Kitchen ondansetron (ZOFRAN-ODT) 4 MG disintegrating tablet Take 4 mg by mouth every 4 (four) hours as needed for nausea or vomiting.  . sodium chloride (OCEAN) 0.65 % nasal spray Place 1 spray into the nose as needed for congestion.  . [DISCONTINUED] hydrALAZINE (APRESOLINE) 10 MG tablet TAKE 1 TABLET (10 MG TOTAL) BY MOUTH 3 (THREE) TIMES DAILY AS NEEDED.  . [DISCONTINUED] Azelastine HCl (ASTEPRO NA) Place into the nose as needed.    Past Medical History  Diagnosis Date  . Hypertension     a. labile HTN  . Atypical chest pain     a. nuclear stress test 04/2010: normal; b. echo 2009: EF 55-60%, mild LVH, impaired LV relaxation, mild aortic sclerosis, mild TR, nl PASP, since echo 2007 no significant change   . History of spinal stenosis     underwent back surgery on 12/03/09 by Dr. Glenna Fellows  . Hyperlipidemia   . Vertigo     positional vertigo  . Dizziness   . Muscle spasm   . Numbness in feet   . Dyspepsia   . Fatigue   . Headaches, cluster   . Joint pain   . Worsening vision   . Shortness of breath   . Heart palpitations   . Nausea and/or vomiting     with walking  . Hypercholesterolemia     with inability to tolerate statin therapy  . Mitral valve prolapse   . History of liver disease     history of liver trouble, has not had any known esposures to jaundiced person  . History of rheumatic fever     as a child  . 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 Medication side effects     a. 30+ medication allergies and/or intolerances; b. she will not take daily antihypertensives    Past Surgical History  Procedure Laterality Date  . Tonsillectomy    . Breast biopsy    . Bladder fulguration    . Cholecystectomy    . Total abdominal hysterectomy    . Hemorrhoid surgery    . Cervical fusion    . Carpal tunnel release      bilaterally  . Lumbar laminectomy      Social History  reports that she has never smoked. She does not have any smokeless  tobacco history on file. She reports that she does not drink alcohol or use illicit drugs.  Family History family history includes Heart attack in her father; Heart failure in her mother; Hypertension in her father.     Review of Systems  HENT: Negative.   Respiratory: Negative.   Cardiovascular: Negative.   Gastrointestinal: Negative.   Endocrine: Negative.   Musculoskeletal: Positive for gait problem.  Neurological: Positive for weakness.  Hematological: Negative.   Psychiatric/Behavioral: Negative.   All  other systems reviewed and are negative.   BP 160/72 mmHg  Pulse 82  Ht 5\' 2"  (1.575 m)  Wt 115 lb 12 oz (52.504 kg)  BMI 21.17 kg/m2  SpO2 98%  Physical Exam  Constitutional: She is oriented to person, place, and time.  Thin, presenting in a wheelchair  HENT:  Head: Normocephalic.  Nose: Nose normal.  Mouth/Throat: Oropharynx is clear and moist.  Eyes: Conjunctivae are normal. Pupils are equal, round, and reactive to light.  Neck: Normal range of motion. Neck supple. No JVD present.  Cardiovascular: Normal rate, regular rhythm, S1 normal, S2 normal, normal heart sounds and intact distal pulses.  Exam reveals no gallop and no friction rub.   No murmur heard. Pulmonary/Chest: Effort normal and breath sounds normal. No respiratory distress. She has no wheezes. She has no rales. She exhibits no tenderness.  Abdominal: Soft. Bowel sounds are normal. She exhibits no distension. There is no tenderness.  Musculoskeletal: Normal range of motion. She exhibits no edema or tenderness.  Lymphadenopathy:    She has no cervical adenopathy.  Neurological: She is alert and oriented to person, place, and time. Coordination normal.  Skin: Skin is warm and dry. No rash noted. No erythema.  Psychiatric: She has a normal mood and affect. Her behavior is normal. Judgment and thought content normal.    Assessment and Plan  Nursing note and vitals reviewed.

## 2014-06-06 NOTE — Patient Instructions (Signed)
You are doing well. No medication changes were made.  Please call us if you have new issues that need to be addressed before your next appt.  Your physician wants you to follow-up in: 6 months.  You will receive a reminder letter in the mail two months in advance. If you don't receive a letter, please call our office to schedule the follow-up appointment.   

## 2014-06-06 NOTE — Assessment & Plan Note (Signed)
Results of her recent echocardiogram and stress test discussed with her. No ischemia noted, normal ejection fraction on echocardiogram

## 2014-06-12 ENCOUNTER — Ambulatory Visit (INDEPENDENT_AMBULATORY_CARE_PROVIDER_SITE_OTHER): Payer: Medicare Other | Admitting: Podiatry

## 2014-06-12 ENCOUNTER — Ambulatory Visit: Payer: Medicare Other | Admitting: Podiatry

## 2014-06-12 DIAGNOSIS — M79676 Pain in unspecified toe(s): Secondary | ICD-10-CM

## 2014-06-12 DIAGNOSIS — B351 Tinea unguium: Secondary | ICD-10-CM | POA: Diagnosis not present

## 2014-06-12 NOTE — Progress Notes (Signed)
Presents today chief complaint of painful elongated toenails.  Objective: Pulses are palpable bilateral nails are thick, yellow dystrophic onychomycosis and painful palpation.   Assessment: Onychomycosis with pain in limb.  Plan: Treatment of nails in thickness and length as covered service secondary to pain.  

## 2014-07-19 ENCOUNTER — Telehealth: Payer: Self-pay | Admitting: *Deleted

## 2014-07-19 NOTE — Telephone Encounter (Signed)
Spoke w/ pt. She reports that she is having significant nausea w/ her hydralazine.  Reports that she was advised by the pharmacy to take w/ food.  She has been taking w/ a cracker and is wondering if this is enough food. Advised pt to take w/ milk or a meal and call is sx do not improve. She is agreeable to this and will call back w/ any questions or concerns.

## 2014-07-19 NOTE — Telephone Encounter (Signed)
Pt states that the medication that she is taking it makes her a bit nauseated.  She is taking it with food but doesn't know what to do. she's asking for any suggestions.  Its may be something simple but she is not that worried.  Please advise.   HydrALAZINE is the medication.

## 2014-07-24 ENCOUNTER — Ambulatory Visit: Payer: Medicare Other | Admitting: Cardiovascular Disease

## 2014-09-11 DIAGNOSIS — M65321 Trigger finger, right index finger: Secondary | ICD-10-CM | POA: Diagnosis not present

## 2014-09-13 DIAGNOSIS — I10 Essential (primary) hypertension: Secondary | ICD-10-CM | POA: Diagnosis not present

## 2014-09-13 DIAGNOSIS — Z9181 History of falling: Secondary | ICD-10-CM | POA: Diagnosis not present

## 2014-09-13 DIAGNOSIS — R51 Headache: Secondary | ICD-10-CM | POA: Diagnosis not present

## 2014-09-13 DIAGNOSIS — M1990 Unspecified osteoarthritis, unspecified site: Secondary | ICD-10-CM | POA: Diagnosis not present

## 2014-09-13 DIAGNOSIS — R42 Dizziness and giddiness: Secondary | ICD-10-CM | POA: Diagnosis not present

## 2014-09-13 DIAGNOSIS — J302 Other seasonal allergic rhinitis: Secondary | ICD-10-CM | POA: Diagnosis not present

## 2014-09-13 DIAGNOSIS — Z87898 Personal history of other specified conditions: Secondary | ICD-10-CM | POA: Diagnosis not present

## 2014-09-18 ENCOUNTER — Encounter: Payer: Self-pay | Admitting: Podiatry

## 2014-09-18 ENCOUNTER — Ambulatory Visit (INDEPENDENT_AMBULATORY_CARE_PROVIDER_SITE_OTHER): Payer: Medicare Other | Admitting: Podiatry

## 2014-09-18 DIAGNOSIS — B351 Tinea unguium: Secondary | ICD-10-CM

## 2014-09-18 DIAGNOSIS — M79673 Pain in unspecified foot: Secondary | ICD-10-CM

## 2014-09-18 NOTE — Progress Notes (Signed)
She presents today with a chief complaint of pain to her toes because her toenails are so thick and so long.  Objective: Pulses are strongly palpable bilateral. Nails are thick yellow dystrophic onychomycotic and sharp incurvated margins are growing into the skin.  Assessment: Pain in limb secondary to onychomycosis and nail dystrophy.  Plan: Debridement of nails 1 through 5 bilateral covered service secondary to pain.

## 2014-10-01 ENCOUNTER — Other Ambulatory Visit
Admit: 2014-10-01 | Disposition: A | Discharge status: Home / Self Care | Payer: Self-pay | Attending: Family Medicine | Admitting: Family Medicine

## 2014-10-01 DIAGNOSIS — I498 Other specified cardiac arrhythmias: Secondary | ICD-10-CM | POA: Diagnosis not present

## 2014-10-01 DIAGNOSIS — I10 Essential (primary) hypertension: Secondary | ICD-10-CM | POA: Diagnosis not present

## 2014-10-01 DIAGNOSIS — Z8639 Personal history of other endocrine, nutritional and metabolic disease: Secondary | ICD-10-CM | POA: Diagnosis not present

## 2014-10-01 DIAGNOSIS — I509 Heart failure, unspecified: Secondary | ICD-10-CM | POA: Diagnosis not present

## 2014-10-01 LAB — COMPREHENSIVE METABOLIC PANEL
ANION GAP: 7 (ref 7–16)
Albumin: 4.2 g/dL
Alkaline Phosphatase: 66 U/L
BUN: 23 mg/dL — ABNORMAL HIGH
Bilirubin,Total: 0.7 mg/dL
CHLORIDE: 103 mmol/L
CO2: 26 mmol/L
CREATININE: 0.83 mg/dL
Calcium, Total: 9.1 mg/dL
EGFR (African American): >60
EGFR (Non-African Amer.): >60
GLUCOSE: 110 mg/dL — AB
Potassium: 4.8 mmol/L
SGOT(AST): 28 U/L
SGPT (ALT): 29 U/L
Sodium: 136 mmol/L
Total Protein: 6.9 g/dL

## 2014-10-01 LAB — CBC WITH DIFFERENTIAL/PLATELET
BASOS ABS: 0.1 10*3/uL (ref 0.0–0.1)
Basophil %: 0.6 %
EOS ABS: 0.5 10*3/uL (ref 0.0–0.7)
Eosinophil %: 3.7 %
HCT: 40.9 % (ref 35.0–47.0)
HGB: 13.6 g/dL (ref 12.0–16.0)
LYMPHS ABS: 4.5 10*3/uL — AB (ref 1.0–3.6)
Lymphocyte %: 37 %
MCH: 32 pg (ref 26.0–34.0)
MCHC: 33.3 g/dL (ref 32.0–36.0)
MCV: 96 fL (ref 80–100)
MONOS PCT: 7.4 %
Monocyte #: 0.9 x10 3/mm (ref 0.2–0.9)
Neutrophil #: 6.2 10*3/uL (ref 1.4–6.5)
Neutrophil %: 51.3 %
PLATELETS: 230 10*3/uL (ref 150–440)
RBC: 4.25 10*6/uL (ref 3.80–5.20)
RDW: 13.4 % (ref 11.5–14.5)
WBC: 12.1 10*3/uL — ABNORMAL HIGH (ref 3.6–11.0)

## 2014-10-01 LAB — DIGOXIN LEVEL: Digoxin: 0.9 ng/mL

## 2014-10-05 NOTE — Discharge Summary (Signed)
PATIENT NAME:  [name], [name] MR#:  [id number] DATE OF BIRTH:  01-Oct-1920  DATE OF ADMISSION:  04/09/2014 DATE OF DISCHARGE:  04/11/2014  PRIMARY CARE PHYSICIAN: Dr. Luan Pulling.     DISCHARGE DIAGNOSES: Hypertension urgency, leukocytosis, gastroesophageal reflux disease, weakness.   CONDITION: Stable.   CODE STATUS: DNR.   HOME MEDICATIONS: Please refer to the medication reconciliation list.   The patient needs home physical therapy.   DIET: Low-sodium diet.   ACTIVITY: As tolerated.   FOLLOWUP CARE: Follow up with PCP and Dr. Rockey Situ within 1 to 2 weeks.   REASON FOR ADMISSION: Headache.   HOSPITAL COURSE: The patient is a 81 years old Caucasian female with hypertension and hyperlipidemia presented to the ED with a headache. The patient was noted to have high blood pressure 218/113.  She was treated with hydralazine with some improvement of blood pressure. For detailed history and physical examination, please refer to the admission note dictated by Dr. Lavetta Nielsen. The patient's laboratory data was unremarkable except WBC 12.3. For hypertension urgency, the patient has been treated with hydralazine IV p.r.n. Since the patient has multiple allergies to medications, the patient was started with clonidine, however, the patient complains of some reaction to clonidine last night. She refused to take hypertension medication. After discussion with Dr. Rockey Situ, she has agreed to take hydralazine 10 mg p.o. Dr. Rockey Situ suggested hydralazine 10 mg p.o. t.i.d. After taking hydralazine, the patient's blood pressure decreased to 128/62.  She got a physical therapy evaluation who suggested the patient needs physical therapy at home. The patient is clinically stable and will be discharged to home today. Discussed the patient's discharge plan with the patient, the patient's daughter, Dr. Rockey Situ, nurse, case manager.   TIME SPENT: About 47 minutes.    ____________________________ Demetrios Loll,  MD qc:AT D: 04/11/2014 15:58:51 ET T: 04/12/2014 02:22:02 ET JOB#: [id number]  cc: Demetrios Loll, MD, <Dictator> Demetrios Loll MD ELECTRONICALLY SIGNED 04/12/2014 17:04

## 2014-10-05 NOTE — H&P (Signed)
PATIENT NAME:  [name], [name] MR#:  [id number] DATE OF BIRTH:  06/25/1920  DATE OF ADMISSION:  04/09/2014  REFERRING PHYSICIAN:  Debbrah Alar, MD   PRIMARY CARE PHYSICIAN: Luan Pulling  CARDIOLOGIST:  Minna Merritts, MD  CHIEF COMPLAINT:  Headache.  HISTORY OF PRESENT ILLNESS:  This is a 81 year old Caucasian female with history of hypertension and hyperlipidemia, presenting with headache. She was at the oral surgeon's office today having a procedure done and noted to be hypertensive with blood pressure of 218/113. She was complaining of headache at the time, frontal in location, radiating globally, quality as throbbing, 6 out of 10 in intensity. No worsening or relieving factors. She had associated blurred vision; however, her symptoms have been improving since blood pressure has improved. She took 2 of her p.o. hydralazine at that time with some improvement in blood pressure.   REVIEW OF SYSTEMS: CONSTITUTIONAL:  Denies fevers, chills, fatigue, or weakness.  EYES:  Denies current blurred vision, double vision, or eye pain.  EARS, NOSE, AND THROAT:  Denies tinnitus, ear pain, or hearing loss. RESPIRATORY:  Denies cough, wheeze, or shortness of breath.  CARDIOVASCULAR:  Denies chest pain, palpitations, or edema.  GASTROINTESTINAL:  Denies nausea, vomiting, diarrhea, or abdominal pain.  GENITOURINARY:  Denies dysuria or hematuria. ENDOCRINE:  Denies nocturia or thyroid problems.  HEMATOLOGIC:  Denies easy bruising or bleeding.  SKIN:  Denies rashes or lesions.  MUSCULOSKELETAL:  Denies pain in the neck, back, shoulder, knees, or hips or arthritic symptoms.  NEUROLOGIC:  Denies paralysis or paresthesias.  PSYCHIATRIC:  Denies anxiety or antidepressive symptoms.   Otherwise, a full review of systems performed by me is negative.  PAST MEDICAL HISTORY:  Hypertension, mitral valve prolapse, hyperlipidemia, osteoarthritis.   SOCIAL HISTORY:  Denies any tobacco use. Positive for  occasional alcohol use.   FAMILY HISTORY:  Denies any known cardiovascular or pulmonary disorders.   ALLERGIES:  AMITRIPTYLINE, AMLODIPINE, ANAPROX, ASPIRIN, AXID, BYSTOLIC, CALAN, CEFTIN, CEPHALOSPORINS AS WELL AS CODEINE, COZAAR, DIOVAN, ERYTHROMYCIN, HYDROCHLOROTHIAZIDE, IODINE, METOPROLOL, MICARDIS, MORPHINE, NONSTEROIDAL ANTI-INFLAMMATORY DRUGS, PENICILLIN, PERCOCET, PHENERGAN, PRILOSEC, PROTONIX, STREPTOMYCIN, SULFA DRUGS, TETRACYCLINE, ULTRAM, VALIUM, VALTURNA. SHE ALSO HAS LISTED ALLERGY TO HYDRALAZINE, BUT IT IS ONE OF HER HOME MEDICATIONS.   HOME MEDICATIONS:  Include Nitrostat 0.4 mg sublingual every 5 minutes as needed for chest pain, digoxin 125 mcg p.o. daily, Dramamine 50 mg p.o. 3 times a day as needed for nausea and vomiting, Pepcid 20 mg p.o. b.i.d. as needed for indigestion and heartburn, Astepro 205.5 mcg inhalation one spray daily as needed for rhinitis, hydralazine 10 mg p.o. 3 times daily as needed for hypertension.   PHYSICAL EXAMINATION: VITAL SIGNS:  Temperature 97.9, heart rate 96, respirations 19, blood pressure maximum of 203/100 and currently 185/74, saturating 99% on room air. Weight 50.8 kg, BMI 21.9.  GENERAL:  Chronically ill-appearing Caucasian female currently in no acute distress.  HEAD:  Normocephalic, atraumatic.  EYES:  Pupils are equal, round, and reactive to light. Extraocular muscles are intact. No scleral icterus. Moist mucous membranes. Dentition is intact. No abscess noted.  EARS, NOSE, AND THROAT:  Clear. No exudates. No external lesions. NECK:  Supple. No thyromegaly. No nodules. No JVD.  PULMONARY:  Clear to auscultation bilaterally without wheezes, rales, or rhonchi. No use of accessory muscles. Good respiratory effort.  CHEST:  Nontender on palpation.  CARDIOVASCULAR:  S1 and S2, regular rate and rhythm. No murmurs, rubs, or gallops. No edema. Pedal pulses 2+ bilaterally.  GASTROINTESTINAL:  Soft, nontender, nondistended.  No masses. Positive bowel  sounds. No hepatosplenomegaly.  MUSCULOSKELETAL:  No swelling, clubbing, or edema. Range of motion is full in all extremities.  NEUROLOGIC:  Cranial nerves II through XII are intact. No gross focal neurological deficits. Sensation is intact. Reflexes are intact. Pronator drift is within normal limits. Strength is 5/5 in all extremities with proximal and distal flexion and extension.  SKIN:  No ulceration, lesions, rash, or cyanosis. Skin is warm and dry. Turgor is intact.  PSYCHIATRIC:  Mood and affect are within normal limits. The patient is alert and oriented x 3. Insight and judgment are intact.   LABORATORY DATA:  Sodium 139, potassium 3.7, chloride 106, bicarbonate 23, BUN 18, creatinine 0.82, glucose 91. Troponin 0.03. WBC 12.3, hemoglobin 13.8, platelets 226,000.    ASSESSMENT AND PLAN:  This is a 81 year old Caucasian female with history of hypertension and hyperlipidemia, presenting with headache, found to be markedly hypertensive.  1.  Hypertensive urgency. Admit to telemetry under observational status. Continue digoxin. We will add p.r.n. IV hydralazine 10 mg IV q. 4 hours as needed for systolic blood pressure greater than 109 or diastolic greater than 323. We will also consult cardiology, Dr. Rockey Situ; she follows with him as an outpatient for poorly controlled hypertension with a multitude of allergies and intolerances to antihypertensives.  2.  Leukocytosis. No evidence of infection or indication for antibiotics at this time.  3.  GERD: ppi therapy 4.  Venous thromboembolism prophylaxis with heparin subcutaneously.   CODE STATUS:  The patient is a full code.  TIME SPENT:  45 minutes.    ____________________________ Aaron Mose. Braelynn Benning, MD dkh:nb D: 04/09/2014 22:58:47 ET T: 04/10/2014 00:41:46 ET JOB#: [id number]  cc: Aaron Mose. Adell Koval, MD, <Dictator> Blakely Maranan Woodfin Ganja MD ELECTRONICALLY SIGNED 04/17/2014 15:11

## 2014-10-05 NOTE — Consult Note (Signed)
General Aspect Primary Cardiologist: Dr. Rockey Situ, MD __________________  81 year old female with history of labile HTN, hypercholesterolemia, severe back pain secondary to spinal stenosis, chronic issues with dizziness nausea and profound weakness, and many medication allergies (30+), unable to tolerate almost all antihypertensive medications per her report who presented to [facility] 10/27 after having oral surgery and her BP at the oral surgeon's office was found to be 218/113.  __________________  PMH:  1. Labline HNT 2. HLD 3. Severe back pain 4. Chronic nausea, dizziness, and weakness 5. Multiple medication allergies __________________   Present Illness 81 year old female with the above problem list who presented to [facility] on 10/27 after recently having oral surgery and her BP was found to be 218/113 at the oral surgeon's office. She was advised to come to [facility] from there.   Patient with known labile HTN and multiple medication intolerances/allergies. She reports that she cannot take any antihypertensive 2/2 adverse reaction. Dr. Rockey Situ finally got her on hydralazine qid prn SBP greater than 180. At her last follow up visit she reported that she did not want any medication. She was reporting HA, blurred vision. She would rather deal with those symptoms she reports. She agreed to take the hydralazine prn.   She comes into [facility] after visiting the oral surgeon per above. Her BP was 218/113. She reports just aching all over. She was not taking any antihypertensive at all and has not for quite sometime. She would rather feel this way than take the medication. She is aware of the risks. She reports that she just cannot take "any medication." She did take 2 of her prn hydralazine, but reports her BP remained elevated - thus prompting her to come to [facility].   Upon arrival to [facility] she was conitnued on digoxin, hydralazine 10 mg q 4 hours prn SBP 180 was added. TnI negative x 3. Cardiology was consulted for  further input.   Physical Exam:  GEN well developed, well nourished, no acute distress   HEENT PERRL, hearing intact to voice, moist oral mucosa   NECK supple   RESP normal resp effort  clear BS   CARD Regular rate and rhythm  Normal, S1, S2  No murmur   ABD denies tenderness  soft  normal BS   EXTR negative edema   SKIN normal to palpation   NEURO cranial nerves intact   PSYCH alert, A+O to time, place, person, good insight   Review of Systems:  General: No Complaints   Skin: No Complaints   ENT: No Complaints   Eyes: Vision difficulty   Neck: No Complaints   Respiratory: No Complaints   Cardiovascular: Chest pain or discomfort   Gastrointestinal: Heartburn   Genitourinary: No Complaints   Vascular: No Complaints   Musculoskeletal: No Complaints   Neurologic: Dizzness  Headache   Hematologic: No Complaints   Endocrine: No Complaints   Psychiatric: No Complaints   Review of Systems: All other systems were reviewed and found to be negative   Medications/Allergies Reviewed Medications/Allergies reviewed   Family & Social History:  Family and Social History:  Family History Coronary Artery Disease  Hypertension   Social History negative tobacco, negative ETOH, negative Illicit drugs   Place of Living Home     Prolapsed Mitral Valve:    Arthritis:    reflux:    allergic rhinitis:    htn:    Lumbar Laminectomy:    choleycystectomy: 1975   Cervical Spinal Fusion: 1975  hysterectomy: 1970   Tonsillectomy: as a child  Home Medications: Medication Instructions Status  Dramamine 50 mg oral tablet 1 tab(s) orally up to 3 times a day, As Needed - for Nausea, Vomiting  Active  Lanoxin 125 mcg (0.125 mg) oral tablet 0.5-1 tab(s) orally once a day Active  Nitrostat 0.4 mg sublingual tablet 1 tab(s) sublingual every 5 minutes, As Needed - for Chest Pain Active  hydrALAZINE 10 mg oral tablet 1 tab(s) orally 3 times a day, As Needed for  hypertension Active  Pepcid 20 mg oral tablet 1 tab(s) orally 2 times a day, As Needed - for Indigestion, Heartburn Active  Astepro 205.5 mcg/inh nasal spray 1-2 spray(s) nasal 1 to 2 times a day, As Needed for rhinitis Active   Lab Results:  Routine Chem:  27-Oct-15 15:50   Glucose, Serum 91  BUN 18  Creatinine (comp) 0.82  Sodium, Serum 139  Potassium, Serum 3.7  Chloride, Serum 106  CO2, Serum 23  Calcium (Total), Serum  8.3  Anion Gap 10  Osmolality (calc) 279  eGFR (African American) >60  eGFR (Non-African American) >60 (eGFR values <61mL/min/1.73 m2 may be an indication of chronic kidney disease (CKD). Calculated eGFR, using the MRDR Study equation, is useful in  patients with stable renal function. The eGFR calculation will not be reliable in acutely ill patients when serum creatinine is changing rapidly. It is not useful in patients on dialysis. The eGFR calculation may not be applicable to patients at the low and high extremes of body sizes, pregnant women, and vetetarians.)  Cardiac:  27-Oct-15 15:50   Troponin I 0.03 (0.00-0.05 0.05 ng/mL or less: NEGATIVE  Repeat testing in 3-6 hrs  if clinically indicated. >0.05 ng/mL: POTENTIAL  MYOCARDIAL INJURY. Repeat  testing in 3-6 hrs if  clinically indicated. NOTE: An increase or decrease  of 30% or more on serial  testing suggests a  clinically important change)    18:57   Troponin I 0.04 (0.00-0.05 0.05 ng/mL or less: NEGATIVE  Repeat testing in 3-6 hrs  if clinically indicated. >0.05 ng/mL: POTENTIAL  MYOCARDIAL INJURY. Repeat  testing in 3-6 hrs if  clinically indicated. NOTE: An increase or decrease  of 30% or more on serial  testing suggests a  clinically important change)    23:12   Troponin I 0.05 (0.00-0.05 0.05 ng/mL or less: NEGATIVE  Repeat testing in 3-6 hrs  if clinically indicated. >0.05 ng/mL: POTENTIAL  MYOCARDIAL INJURY. Repeat  testing in 3-6 hrs if  clinically indicated. NOTE: An  increase or decrease  of 30% or more on serial  testing suggests a  clinically important change)  Routine Hem:  27-Oct-15 15:50   WBC (CBC)  12.3  RBC (CBC) 4.38  Hemoglobin (CBC) 13.8  Hematocrit (CBC) 41.7  Platelet Count (CBC) 226  MCV 95  MCH 31.5  MCHC 33.1  RDW 13.0  Neutrophil % 53.6  Lymphocyte % 35.9  Monocyte % 6.4  Eosinophil % 3.6  Basophil % 0.5  Neutrophil #  6.6  Lymphocyte #  4.4  Monocyte # 0.8  Eosinophil # 0.4  Basophil # 0.1 (Result(s) reported on 09 Apr 2014 at 04:18PM.)   EKG:  EKG Interp. by me   Interpretation NSR, 81, left axis, TWI V2    Morphine: Anaphylaxis  Sulfa: Unknown  Codeine: Unknown  Tape: Unknown  Tetracycline: Unknown  Anaprox: Unknown  Relafen: Unknown  Valium: Unknown  Penicillin: Unknown  Ceftin: Unknown  Erythromycin: Unknown  Aspirin: Unknown  Percocet 2.5/325: GI Distress  Diovan: GI Distress  Ultram: GI Distress  Cephalosporins: Rash, GI Distress  NSAIDS: Pain, GI Distress  Valturna: N/V/Diarrhea  Hydralazine: Other  Other- Explain in Comments Line: Other  Amlodipine: Unknown  Axid: Unknown  Bystolic: Unknown  Calan: Unknown  Cozaar: Unknown  Amitriptyline: Unknown  Hydrochlorothiazide: Unknown  Metoprolol: Unknown  Micardis: Unknown  Phenergan: Unknown  Prilosec: Unknown  Protonix: Unknown  Streptomycin: Unknown  Iodine: Unknown  Other -Explain in Comment: Unknown  Vital Signs/Nurse's Notes: **Vital Signs.:   28-Oct-15 12:14  Vital Signs Type Routine  Temperature Temperature (F) 98.2  Celsius 36.7  Temperature Source oral  Pulse Pulse 83  Respirations Respirations 20  Systolic BP Systolic BP 944  Diastolic BP (mmHg) Diastolic BP (mmHg) 82  Mean BP 117  Pulse Ox % Pulse Ox % 95  Pulse Ox Activity Level  At rest  Oxygen Delivery Room Air/ 21 %    Impression 81 year old female with history of labile HTN, hypercholesterolemia, severe back pain secondary to spinal stenosis, chronic issues  with dizziness nausea and profound weakness, and many medication allergies (30+), unable to tolerate almost all antihypertensive medications per her report who presented to [facility] 10/27 after having oral surgery and her BP at the oral surgeon's office was found to be 218/113.   1. Hypertensive urgency: -Multiple medication allergies/intolerances (30+) -History of labile HTN - very symptomatic  -She will require a slightly increased SBP, though <180 -Conitnue current dose of digoxin as she is tolerating this -Continue current prn dosing of hydralazine 10 mg q 4 hours goal SBP <180  2. Chest pain: -Nurse advised me after my visit that she was complaing of chest pain - she was not complaing of this just 10 minutes prior -TnI negative x 3 -Will check an EKG to compare to prior studies  3. Leukocytosis: -No signs of infection - likely inflammatory   Electronic Signatures for Addendum Section:  Leonie Man (MD) (Signed Addendum 28-Oct-15 17:20)  I saws & examined the patient along with Mr. Purcell Mouton.  I have reviewed all of the available data including her OP Clinic notes.  I agree with his findings, exam & recommendations.  81 y/o woman with long-standing, difficult to control labile HTN with multle drug "allergies" that drastically limit out ability to treat her. She was s/p Oral Sgx & noted HA/Dizziness -- BP was > 215 mmHg.  Referred for inpatient managment. @ home is on regimen of PRN Hydralazine for SBP>180 mmHg.  I was under the impression that she was on Clonidine (not a great option), but she is not. BP is better now with IV Rx - would just start standing dose of 25 mg bid Hydralazine.  Hopefully this low dose standing med will keep her BP in the 140-170 mmHg range.   Had Chest tightness with HTN - at this point, medical Rx is probably best -- not many options for treatment & would be reluctant to pursue invasive evaluation.  Will ask Dr. Rockey Situ to see in AM to help set up a stable  OP plan for d/c.  [facility]   Electronic Signatures: Rise Mu (PA-C)  (Signed 28-Oct-15 17:00)  Authored: General Aspect/Present Illness, History and Physical Exam, Review of System, Family & Social History, Past Medical History, Home Medications, Labs, EKG , Allergies, Vital Signs/Nurse's Notes, Impression/Plan Leonie Man (MD)  (Signed 28-Oct-15 17:20)  Co-Signer: General Aspect/Present Illness, History and Physical Exam, Review of System, Family & Social  History, Past Medical History, Home Medications, Labs, EKG , Allergies, Vital Signs/Nurse's Notes, Impression/Plan   Last Updated: 28-Oct-15 17:20 by Leonie Man (MD)

## 2014-10-06 NOTE — Discharge Summary (Signed)
PATIENT NAME:  [name], [name] MR#:  [id number] DATE OF BIRTH:  1920/06/23  DATE OF ADMISSION:  07/06/2011 DATE OF DISCHARGE:  07/08/2011  ADMITTING DIAGNOSES: Generalized weakness, nausea, vomiting, and diarrhea.   DISCHARGE DIAGNOSES:  1. Generalized weakness, likely in the setting of dehydration, viral syndrome.  2. Cough with possible bronchitis. The patient did not want to take any antibiotics.  3. Oral thrush status post treatment with [employer]'s mouthwash, no further thrush.  4. Hypertension, currently off antihypertensives and blood pressure has been slightly elevated, need to monitor this at home.  5. Spinal stenosis.  6. Mitral valve prolapse.  7. Vertigo. 8. Hyperlipidemia.  9. Status post cervical spine surgery.  10. Status post lumbar laminectomy.  11. Carpal tunnel syndrome, bilateral.  12. Status post hysterectomy.  13. Status post tonsillectomy and adenoidectomy.  14. Status post cholecystectomy.   PERTINENT LABS/STUDIES: WBC count 10.2 and hemoglobin 14.9. BUN 15, creatinine 0.88, sodium 139, and potassium 3.4. Most recent potassium today is 3.1 and calcium is 7.6. Urine cultures were negative. Influenza A and B were negative.   Urinalysis: Nitrites negative, leukocytes negative.   Abdominal x-ray with chest showed no acute abnormality.   HOSPITAL COURSE: Please see history and physical done by the admitting physician. The patient is an 81 year old white female who presented to the hospital with complaint of nasal congestion and cough, also was having nausea, vomiting, and diarrhea. The patient was admitted for dehydration and generalized weakness. She was given IV fluids, symptomatically treated. The patient initially had an episode of diarrhea, but that continued to persist and was having multiple bowel movements. She had stools studies written to be checked; however, her diarrhea resolved on its own, likely due to viral cause. At this time, the patient is still feeling very  weak and still has some nausea, but she is stable for discharge.   DISCHARGE MEDICATIONS:  1. Beconase aqua spray 2 puffs twice a day. 2. Digoxin 125 mg 1 tab p.o. daily. 3. Famotidine 20 mg 1 tab p.o. twice a day. 4. Lidoderm 5% topically daily.  5. Dramamine 50 mg 1 tab p.o. every 6 hours p.r.n.  6. Vitamin D3 50,000 international units daily.  7. Zyrtec 10 mg daily.  8. Zofran 4 mg every 8 hours p.r.n.  9. K-Dur 40 mEq p.o. twice a day x2 days.   DIET: Low sodium.   TIMEFRAME FOR FOLLOW-UP: Followup in 1 to 2 weeks with Dr. Grayland Ormond. Outpatient GI evaluation with [facility] at the next available appointment.   TIME SPENT: 35 minutes. ____________________________ Lafonda Mosses Posey Pronto, MD shp:slb D: 07/08/2011 12:24:07 ET T: 07/08/2011 13:30:40 ET JOB#: [id number]  cc: Kalaysia Demonbreun H. Posey Pronto, MD, <Dictator> Alric Seton MD ELECTRONICALLY SIGNED 07/31/2011 9:57

## 2014-10-06 NOTE — H&P (Signed)
PATIENT NAME:  [name], [name] MR#:  [id number] DATE OF BIRTH:  01/17/1921  DATE OF ADMISSION:  07/06/2011  PRIMARY CARE PHYSICIAN: Dr. Dwain Sarna - [place]  CHIEF COMPLAINT: Generalized weakness.  HISTORY OF PRESENT ILLNESS: This is an 81 year old female who Saturday started getting nasal congestion and cough. Shortly afterward she started having poor p.o. intake. Monday she became nauseated and vomited a couple of times. She developed some diarrhea this morning. she just feels weak all over and has been having chills, but no fever. She is complaining about her left ear hurting.   PAST MEDICAL HISTORY:  1. Hypertension. 2. Spinal stenosis.  3. Mitral valve prolapse.  4. Vertigo.  5. Hyperlipidemia.   PAST SURGICAL HISTORY:  1. Cervical surgery. 2. Lumbar laminectomy. 3. Carpal tunnel syndrome, bilateral. 4. Hysterectomy. 5. Tonsillectomy and adenoidectomy. 6. Cholecystectomy.   ALLERGIES: Multiple including amlodipine, aspirin, Bystolic, verapamil, codeine, Cozaar, Elavil, erythromycin, hydrochlorothiazide, Lopressor, Micardis, penicillin, Phenergan, Prilosec, Protonix, streptomycin, Tequin, tetracycline, and Valium.  CURRENT MEDICATIONS:  1. Beclomethasone 2 puffs twice a day. 2. Zyrtec 10 mg daily p.r.n.  3. Digoxin 0.125 mg daily.  4. Dramamine p.r.n.  5. Vitamin D2 50,000 units  weekly.  6. Pepcid 20 mg daily.  7. Zofran 4 mg p.r.n.   SOCIAL HISTORY: She does not smoke and does not drink alcohol.   FAMILY HISTORY: Denies any coronary artery disease or hypertension.   REVIEW OF SYSTEMS: CONSTITUTIONAL: She has had chills, but no fever. EYES: No blurred vision. ENT: Some left ear pain. CARDIOVASCULAR: No chest pain. PULMONARY: Some cough. GI: Some nausea, vomiting, and diarrhea. GU: No dysuria. ENDOCRINE: No heat or cold intolerance. INTEGUMENT: No rash. MUSCULOSKELETAL: Occasional joint pain. NEUROLOGIC: No numbness or weakness.   PHYSICAL EXAMINATION:   VITAL SIGNS:  Temperature 98, pulse 97, respirations 18, blood pressure 173/77.   GENERAL: This is a well-nourished white female who appears ill.   HEENT: The pupils are equal, round, and reactive to light. Oral mucosa is moist. There is some whitish looking plaques on the tongue and the oral mucosa.   NECK: Supple. No JVD, lymphadenopathy or thyromegaly.   HEART: Regular rate and rhythm. No murmur, rubs, or gallops.   LUNGS: Clear to auscultation. No dullness to percussion. She is not using accessory muscles.   ABDOMEN: Soft and nondistended. Bowel sounds are positive. No hepatosplenomegaly. There is some mild diffuse tenderness. No rebound or guarding.   EXTREMITIES: There is no edema. No joint deformity.   NEUROLOGIC: Cranial nerves II through XII are intact. She is alert and oriented x4.   SKIN: Moist with no rash.   LABS/STUDIES: White blood cells are 10.2 and hemoglobin 14.9. BUN 15, creatinine 0.88, sodium 139, and potassium 3.4.   Abdominal series x-ray showed no bowel gas pattern abnormalities. No pneumonia.   ASSESSMENT AND PLAN:  1. Generalized weakness: I think this is secondary to the poor p.o. intake, probably some mild dehydration. We will hydrate her overnight and get physical therapy to see her in the morning. At this point, she cannot even ambulate out of the bed and feel it would be unsafe to discharge her home.  2. Oral thrush: We will start some Dukes mouthwash. This may be affecting her p.o. intake.  3. Nausea: This may be from some type of viral syndrome. We will just treat the underlying cause and give her Zofran p.r.n. 4. Viral syndrome: This is the most likely explanation for her symptoms. She has had a flu shot this year. We  will give her supportive care.  5. Hypertension: Her primary care doctor has taken her off blood pressure medications because she has had adverse reactions to a whole host of them. We will observe her blood pressure and intervene if absolutely necessary,  but want to avoid adverse reactions.  6. Diarrhea: She has only reported one episode of this. If it continues then we will go ahead and check stools for C. difficile and other organisms.   TIME SPENT ON ADMISSION: 30 minutes. ____________________________ Baxter Hire, MD jdj:[id number] D: 07/06/2011 15:59:30 ET T: 07/06/2011 16:25:48 ET JOB#: [id number]  cc: Baxter Hire, MD, <Dictator> PCP - Dr. Dwain Sarna - Jeanie Cooks MD ELECTRONICALLY SIGNED 07/06/2011 21:17

## 2014-11-20 ENCOUNTER — Ambulatory Visit (INDEPENDENT_AMBULATORY_CARE_PROVIDER_SITE_OTHER): Payer: Medicare Other | Admitting: Podiatry

## 2014-11-20 ENCOUNTER — Encounter: Payer: Self-pay | Admitting: Podiatry

## 2014-11-20 DIAGNOSIS — B351 Tinea unguium: Secondary | ICD-10-CM

## 2014-11-20 DIAGNOSIS — M79673 Pain in unspecified foot: Secondary | ICD-10-CM | POA: Diagnosis not present

## 2014-11-20 NOTE — Progress Notes (Signed)
She presents today with a chief complaint of pain to her toes because her toenails are so thick and so long.  Objective: Pulses are strongly palpable bilateral. Nails are thick yellow dystrophic onychomycotic and sharp incurvated margins are growing into the skin.  Assessment: Pain in limb secondary to onychomycosis and nail dystrophy.  Plan: Debridement of nails 1 through 5 bilateral covered service secondary to pain.

## 2014-11-25 DIAGNOSIS — M25531 Pain in right wrist: Secondary | ICD-10-CM | POA: Diagnosis not present

## 2014-11-25 DIAGNOSIS — M25532 Pain in left wrist: Secondary | ICD-10-CM | POA: Diagnosis not present

## 2014-12-03 ENCOUNTER — Ambulatory Visit (INDEPENDENT_AMBULATORY_CARE_PROVIDER_SITE_OTHER): Payer: Medicare Other | Admitting: Cardiovascular Disease

## 2014-12-03 ENCOUNTER — Encounter: Payer: Self-pay | Admitting: Cardiovascular Disease

## 2014-12-03 VITALS — BP 180/93 | HR 81 | Ht 62.0 in | Wt 117.2 lb

## 2014-12-03 DIAGNOSIS — R0602 Shortness of breath: Secondary | ICD-10-CM | POA: Diagnosis not present

## 2014-12-03 DIAGNOSIS — I10 Essential (primary) hypertension: Secondary | ICD-10-CM | POA: Diagnosis not present

## 2014-12-03 DIAGNOSIS — I119 Hypertensive heart disease without heart failure: Secondary | ICD-10-CM

## 2014-12-03 DIAGNOSIS — R2681 Unsteadiness on feet: Secondary | ICD-10-CM

## 2014-12-03 MED ORDER — HYDRALAZINE HCL 25 MG PO TABS: 25.0000 mg | ORAL_TABLET | Freq: Four times a day (QID) | ORAL | Status: DC

## 2014-12-03 NOTE — Assessment & Plan Note (Signed)
High fall risk. Walks with a walker

## 2014-12-03 NOTE — Assessment & Plan Note (Signed)
Recommended she increase her hydralazine up to 25 mg 4 times a day

## 2014-12-03 NOTE — Patient Instructions (Signed)
You are doing well. Please increase the hydralazine up to 25 mg four times a day  Please call us if you have new issues that need to be addressed before your next appt.  Your physician wants you to follow-up in: 6 months.  You will receive a reminder letter in the mail two months in advance. If you don't receive a letter, please call our office to schedule the follow-up appointment.

## 2014-12-03 NOTE — Progress Notes (Signed)
Patient ID: [name], female    DOB: 22-Jan-1921, 81 y.o.   MRN: [id number]  HPI Comments:  81 year old woman with a past history of labile hypertension and hypercholesterolemia, significant medication allergies, history of severe back pain secondary to spinal stenosis, chronic issues with dizziness nausea and profound weakness previously seen for management of her blood pressure. History of falls Recent echocardiogram showing normal ejection fraction, stress test showing no ischemia She presents today for follow-up of her hypertension  In general she reports that she has been doing well. Legs are getting weaker. She does not go outside the house very much She has a dog, takes him for a walk by opening the back door to a patio, keeps him on a leash, stands at the door Walks in the house with a walker with wheels in the front and back with hand brake Reports that she has had recent falls Son presents with her today. She reports she was not checking her blood pressure much. She states blood pressure has been running high, typically 160 or higher. She forgot to take extra hydralazine for high blood pressure. Otherwise denies having any new symptoms. Does not like taking medications.  EKG on today's visit shows normal sinus rhythm with rate 81 bpm, left axis deviation, no significant ST or T-wave changes  Other past medical history Previously admitted to [facility] as above after undergoing oral surgery. She was found to have a BP of 218/113 at the oral surgeon's office at sent to [facility] for further evaluation. found to have a BP of 188/82 (previously 218/113 at her oral surgeon).  she had stopped taking her prn hydralazine all together.   She did complain of chest pain during her admission with negative TnI - felt to be atypical in nature.   On a prior clinic visit she was taking methyldopa and felt that she was having side effects including nausea and vomiting. Also with malaise and fatigue. She  requested to go back on clonidine. Clonidine was restarted at a low dose as she was taking previously. She states having symptoms on a low dose clonidine despite having taken this before. Again symptoms as above. We changed to Cardura 2 mg in the evening. She didn't have side effects on the medication.   normal nuclear stress test in March 2011.   echocardiogram in 2009 showing impaired relaxation and mild aortic sclerosis.   hospitalized at [facility] from 09/12/12 until 09/15/12.  She was hospitalized because of difficulty walking, found to have orthostatic hypotension as well as severe hypertension at times.  She had an MRI which was negative.     Allergies  Allergen Reactions  . Amlodipine Nausea Only  . Aspirin   . Bystolic [Nebivolol Hcl]     Vision problem  . Calan [Verapamil Hcl]     Weakness and staggering  . Cardura [Doxazosin Mesylate]   . Cephalosporins   . Clonidine Derivatives     Facial swelling   . Codeine   . Cozaar   . Doxazosin   . Elavil [Amitriptyline Hcl]   . Erythromycin   . Gantrisin [Sulfisoxazole]   . Hctz [Hydrochlorothiazide]   . Iodides   . Lopressor [Metoprolol Tartrate]   . Methyldopa     Facial swelling   . Micardis [Telmisartan]   . Morphine And Related   . Nizatidine   . Nsaids   . Pantoprazole Sodium   . Penicillins   . Prilosec [Omeprazole]   . Promethazine Hcl   .  Streptomycin   . Sulfa Drugs Cross Reactors   . Tequin   . Tetracyclines & Related   . Toprol Xl [Metoprolol Succinate]   . Ultram [Tramadol]   . Valium   . Valturna [Aliskiren-Valsartan] Nausea And Vomiting    Current Outpatient Prescriptions on File Prior to Visit  Medication Sig Dispense Refill  . digoxin (LANOXIN) 0.125 MG tablet Take 0.125 mg by mouth daily.    Marland Kitchen dimenhyDRINATE (DRAMAMINE) 50 MG tablet Take 50 mg by mouth every 8 (eight) hours as needed (for nausea).    . nitroGLYCERIN (NITROSTAT) 0.4 MG SL tablet Place 0.4 mg under the tongue every 5  (five) minutes as needed for chest pain.    . sodium chloride (OCEAN) 0.65 % nasal spray Place 1 spray into the nose as needed for congestion.     No current facility-administered medications on file prior to visit.    Past Medical History  Diagnosis Date  . Hypertension     a. labile HTN  . Atypical chest pain     a. nuclear stress test 04/2010: normal; b. echo 2009: EF 55-60%, mild LVH, impaired LV relaxation, mild aortic sclerosis, mild TR, nl PASP, since echo 2007 no significant change   . History of spinal stenosis     underwent back surgery on 12/03/09 by Dr. Glenna Fellows  . Hyperlipidemia   . Vertigo     positional vertigo  . Dizziness   . Muscle spasm   . Numbness in feet   . Dyspepsia   . Fatigue   . Headaches, cluster   . Joint pain   . Worsening vision   . Shortness of breath   . Heart palpitations   . Nausea and/or vomiting     with walking  . Hypercholesterolemia     with inability to tolerate statin therapy  . Mitral valve prolapse   . History of liver disease     history of liver trouble, has not had any known esposures to jaundiced person  . History of rheumatic fever     as a child  . 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 Medication side effects     a. 30+ medication allergies and/or intolerances; b. she will not take daily antihypertensives    Past Surgical History  Procedure Laterality Date  . Tonsillectomy    . Breast biopsy    . Bladder fulguration    . Cholecystectomy    . Total abdominal hysterectomy    . Hemorrhoid surgery    . Cervical fusion    . Carpal tunnel release      bilaterally  . Lumbar laminectomy      Social History  reports that she has never smoked. She does not have any smokeless tobacco history on file. She reports that she does not drink alcohol or use illicit drugs.  Family History family history includes Heart attack in her father; Heart failure in her mother; Hypertension in her  father.      Review of Systems  Constitutional: Negative.   HENT: Negative.   Eyes: Negative.   Respiratory: Negative.   Cardiovascular: Negative.   Gastrointestinal: Negative.   Endocrine: Negative.   Musculoskeletal: Positive for gait problem.  Skin: Negative.   Allergic/Immunologic: Negative.   Neurological: Negative.   Hematological: Negative.   Psychiatric/Behavioral: Negative.   All other systems reviewed and are negative.   BP 180/93 mmHg  Pulse 81  Ht 5\' 2"  (1.575 m)  Wt 117 lb  4 oz (53.184 kg)  BMI 21.44 kg/m2  Physical Exam  Constitutional: She is oriented to person, place, and time. She appears well-developed and well-nourished.  HENT:  Head: Normocephalic.  Nose: Nose normal.  Mouth/Throat: Oropharynx is clear and moist.  Eyes: Conjunctivae are normal. Pupils are equal, round, and reactive to light.  Neck: Normal range of motion. Neck supple. No JVD present.  Cardiovascular: Normal rate, regular rhythm, normal heart sounds and intact distal pulses.  Exam reveals no gallop and no friction rub.   No murmur heard. Pulmonary/Chest: Effort normal and breath sounds normal. No respiratory distress. She has no wheezes. She has no rales. She exhibits no tenderness.  Abdominal: Soft. Bowel sounds are normal. She exhibits no distension. There is no tenderness.  Musculoskeletal: Normal range of motion. She exhibits no edema or tenderness.  Lymphadenopathy:    She has no cervical adenopathy.  Neurological: She is alert and oriented to person, place, and time. Coordination normal.  Skin: Skin is warm and dry. No rash noted. No erythema.  Psychiatric: She has a normal mood and affect. Her behavior is normal. Judgment and thought content normal.

## 2014-12-20 DIAGNOSIS — M67431 Ganglion, right wrist: Secondary | ICD-10-CM | POA: Diagnosis not present

## 2015-01-09 ENCOUNTER — Other Ambulatory Visit: Payer: Self-pay | Admitting: Family Medicine

## 2015-02-26 ENCOUNTER — Ambulatory Visit (INDEPENDENT_AMBULATORY_CARE_PROVIDER_SITE_OTHER): Payer: Medicare Other | Admitting: Podiatry

## 2015-02-26 ENCOUNTER — Encounter: Payer: Self-pay | Admitting: Podiatry

## 2015-02-26 DIAGNOSIS — B351 Tinea unguium: Secondary | ICD-10-CM | POA: Diagnosis not present

## 2015-02-26 DIAGNOSIS — M79676 Pain in unspecified toe(s): Secondary | ICD-10-CM

## 2015-02-26 NOTE — Progress Notes (Signed)
She presents today with a chief complaint of painful elongated toenails 1 through 5 bilateral.  Objective: Nails are thick yellow dystrophic onychomycotic and painful palpation. Pulses are strongly palpable bilateral.  Assessment: Pain in limb secondary to onychomycosis 1 through 5 bilateral.  Plan: Discussed etiology pathology conservative versus surgical therapies. Debrided nails 1 through 5 bilateral covered service secondary to pain. Follow up with her in 3 months.

## 2015-03-06 DIAGNOSIS — M65321 Trigger finger, right index finger: Secondary | ICD-10-CM | POA: Diagnosis not present

## 2015-03-28 ENCOUNTER — Encounter: Payer: Self-pay | Admitting: Family Medicine

## 2015-03-28 ENCOUNTER — Ambulatory Visit (INDEPENDENT_AMBULATORY_CARE_PROVIDER_SITE_OTHER): Payer: Medicare Other | Admitting: Family Medicine

## 2015-03-28 VITALS — BP 185/65 | HR 89 | Temp 98.2°F | Resp 16 | Ht 62.0 in | Wt 112.2 lb

## 2015-03-28 DIAGNOSIS — Z283 Underimmunization status: Secondary | ICD-10-CM

## 2015-03-28 DIAGNOSIS — I10 Essential (primary) hypertension: Secondary | ICD-10-CM

## 2015-03-28 DIAGNOSIS — Z23 Encounter for immunization: Secondary | ICD-10-CM | POA: Diagnosis not present

## 2015-03-28 DIAGNOSIS — Z2839 Other underimmunization status: Secondary | ICD-10-CM

## 2015-03-28 NOTE — Patient Instructions (Addendum)
Take an extra dose of Hydralazine when you get home.  Take an extra Hydralazine for Systolic BPs >051

## 2015-03-28 NOTE — Progress Notes (Signed)
Name: [name]   MRN: [id number]    DOB: 20-Sep-1920   Date:03/28/2015       Progress Note  Subjective  Chief Complaint  Chief Complaint  Patient presents with  . Hypertension    6 month follow up    HPI For f/u of HBP.  BPs at home vary.  Can be as high as 180s.  Takes an extra Hydralazine when pressure above 160.  Feeling fair.  Not vomiting.  Takes Dramimine tid and it works well.  No problem-specific assessment & plan notes found for this encounter.   Past Medical History  Diagnosis Date  . Hypertension     a. labile HTN  . Atypical chest pain     a. nuclear stress test 04/2010: normal; b. echo 2009: EF 55-60%, mild LVH, impaired LV relaxation, mild aortic sclerosis, mild TR, nl PASP, since echo 2007 no significant change   . History of spinal stenosis     underwent back surgery on 12/03/09 by Dr. Glenna Fellows  . Hyperlipidemia   . Vertigo     positional vertigo  . Dizziness   . Muscle spasm   . Numbness in feet   . Dyspepsia   . Fatigue   . Headaches, cluster   . Joint pain   . Worsening vision   . Shortness of breath   . Heart palpitations   . Nausea and/or vomiting     with walking  . Hypercholesterolemia     with inability to tolerate statin therapy  . Mitral valve prolapse   . History of liver disease     history of liver trouble, has not had any known esposures to jaundiced person  . History of rheumatic fever     as a child  . 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 Medication side effects     a. 30+ medication allergies and/or intolerances; b. she will not take daily antihypertensives    Social History  Substance Use Topics  . Smoking status: Never Smoker   . Smokeless tobacco: Never Used  . Alcohol Use: No     Current outpatient prescriptions:  .  DIGITEK 125 MCG tablet, TAKE ONE-HALF TABLET ON MONDAY; WEDNESDAY AND FRIDAY TAKE 1 TABLET ON ON TUES; THURS; SAT; AND SUN., Disp: 90 tablet, Rfl: 12 .  dimenhyDRINATE  (DRAMAMINE) 50 MG tablet, Take 50 mg by mouth every 8 (eight) hours as needed (for nausea)., Disp: , Rfl:  .  fluticasone (FLONASE) 50 MCG/ACT nasal spray, Place into both nostrils as needed for allergies or rhinitis., Disp: , Rfl:  .  hydrALAZINE (APRESOLINE) 25 MG tablet, Take 1 tablet (25 mg total) by mouth 4 (four) times daily., Disp: 120 tablet, Rfl: 11 .  nitroGLYCERIN (NITROSTAT) 0.4 MG SL tablet, Place 0.4 mg under the tongue every 5 (five) minutes as needed for chest pain., Disp: , Rfl:  .  sodium chloride (OCEAN) 0.65 % nasal spray, Place 1 spray into the nose as needed for congestion., Disp: , Rfl:  .  triamcinolone (NASACORT ALLERGY 24HR) 55 MCG/ACT AERO nasal inhaler, Place 2 sprays into the nose daily., Disp: , Rfl:   Allergies  Allergen Reactions  . Amlodipine Nausea Only  . Aspirin   . Bystolic [Nebivolol Hcl]     Vision problem  . Calan [Verapamil Hcl]     Weakness and staggering  . Cardura [Doxazosin Mesylate]   . Cephalosporins   . Clonidine Derivatives     Facial swelling   .  Codeine   . Cozaar   . Doxazosin   . Elavil [Amitriptyline Hcl]   . Erythromycin   . Gantrisin [Sulfisoxazole]   . Hctz [Hydrochlorothiazide]   . Iodides   . Lopressor [Metoprolol Tartrate]   . Methyldopa     Facial swelling   . Micardis [Telmisartan]   . Morphine And Related   . Nizatidine   . Nsaids   . Pantoprazole Sodium   . Penicillins   . Prilosec [Omeprazole]   . Promethazine Hcl   . Streptomycin   . Sulfa Drugs Cross Reactors   . Tequin   . Tetracyclines & Related   . Toprol Xl [Metoprolol Succinate]   . Ultram [Tramadol]   . Valium   . Valturna [Aliskiren-Valsartan] Nausea And Vomiting    Review of Systems  Constitutional: Negative for fever, chills, weight loss and malaise/fatigue.  HENT: Negative for hearing loss.   Eyes: Negative for blurred vision and double vision.  Respiratory: Negative for cough, shortness of breath and wheezing.   Cardiovascular:  Negative for chest pain, palpitations and leg swelling.  Gastrointestinal: Negative for heartburn, abdominal pain and blood in stool.  Genitourinary: Negative for dysuria, urgency and frequency.  Musculoskeletal: Negative for myalgias and joint pain.  Neurological: Negative for dizziness, weakness and headaches.      Objective  Filed Vitals:   03/28/15 1336  BP: 177/80  Pulse: 89  Temp: 98.2 F (36.8 C)  TempSrc: Oral  Resp: 16  Height: 5\' 2"  (1.575 m)  Weight: 112 lb 3.2 oz (50.894 kg)     Physical Exam  Constitutional: No distress.  HENT:  Head: Normocephalic and atraumatic.  Neck: Normal range of motion. Neck supple. Carotid bruit is not present. No thyromegaly present.  Cardiovascular: Normal rate, regular rhythm, normal heart sounds and intact distal pulses.  Exam reveals no gallop and no friction rub.   No murmur heard. Pulmonary/Chest: Effort normal and breath sounds normal. No respiratory distress. She has no wheezes. She has no rales.  Abdominal: Soft. Bowel sounds are normal. She exhibits no distension and no mass. There is no tenderness.  Musculoskeletal: She exhibits no edema.  Lymphadenopathy:    She has no cervical adenopathy.  Vitals reviewed.     No results found for this or any previous visit (from the past 2160 hour(s)).   Assessment & Plan  1. Hypertension, accelerated   2. Need for influenza vaccination  - Flu vaccine HIGH DOSE PF (Fluzone High dose) - Pneumococcal conjugate vaccine 13-valent  3. Immunization deficiency

## 2015-04-07 DIAGNOSIS — H35372 Puckering of macula, left eye: Secondary | ICD-10-CM | POA: Diagnosis not present

## 2015-04-30 ENCOUNTER — Ambulatory Visit: Payer: Medicare Other | Admitting: Podiatry

## 2015-05-28 ENCOUNTER — Encounter: Payer: Self-pay | Admitting: Podiatry

## 2015-05-28 ENCOUNTER — Ambulatory Visit (INDEPENDENT_AMBULATORY_CARE_PROVIDER_SITE_OTHER): Payer: Medicare Other | Admitting: Podiatry

## 2015-05-28 DIAGNOSIS — M79676 Pain in unspecified toe(s): Secondary | ICD-10-CM | POA: Diagnosis not present

## 2015-05-28 DIAGNOSIS — B351 Tinea unguium: Secondary | ICD-10-CM

## 2015-05-28 NOTE — Progress Notes (Signed)
She presents today with chief complaint of painful elongated toenails 1 through 5 bilateral.  Objective: Vital signs are stable she is alert and oriented 3. Pulses are strongly palpable. Her toenails are thick yellow dystrophic leg mycotic and painful palpation worse is on the right side.  Assessment: Pain in limb secondary to onychomycosis 1 through 5 bilateral right greater than left.  Plan: Debridement of toenails 1 through 5 bilateral covered service secondary to pain.

## 2015-06-03 ENCOUNTER — Encounter: Payer: Self-pay | Admitting: Cardiovascular Disease

## 2015-06-03 ENCOUNTER — Ambulatory Visit (INDEPENDENT_AMBULATORY_CARE_PROVIDER_SITE_OTHER): Payer: Medicare Other | Admitting: Cardiovascular Disease

## 2015-06-03 VITALS — BP 148/60 | HR 85 | Ht 64.0 in | Wt 113.0 lb

## 2015-06-03 DIAGNOSIS — R002 Palpitations: Secondary | ICD-10-CM

## 2015-06-03 DIAGNOSIS — I10 Essential (primary) hypertension: Secondary | ICD-10-CM | POA: Diagnosis not present

## 2015-06-03 DIAGNOSIS — E7800 Pure hypercholesterolemia, unspecified: Secondary | ICD-10-CM | POA: Diagnosis not present

## 2015-06-03 MED ORDER — HYDRALAZINE HCL 25 MG PO TABS: 25.0000 mg | ORAL_TABLET | Freq: Four times a day (QID) | ORAL | Status: DC

## 2015-06-03 NOTE — Assessment & Plan Note (Signed)
Did not tolerate statins in the past, no medication changes made

## 2015-06-03 NOTE — Progress Notes (Signed)
Patient ID: [name], female    DOB: July 31, 1920, 81 y.o.   MRN: [id number]  HPI Comments: 81 year old woman with a past history of labile hypertension and hypercholesterolemia, significant medication allergies, history of severe back pain secondary to spinal stenosis, chronic issues with dizziness nausea and profound weakness previously seen for management of her blood pressure. History of falls Recent echocardiogram showing normal ejection fraction, stress test showing no ischemia She presents today for follow-up of her hypertension  In follow-up she reports that she is doing very well Significant stress that she brings on herself, Lives alone, does not go outside her house much as legs are weak Walks of a walker, no recent falls Checks her blood pressure at times, when this runs high, will take extra hydralazine  EKG on today's visit shows normal sinus rhythm with rate 85 bpm, left axis deviation, no significant ST or T-wave changes  Other past medical history Previously admitted to [facility] as above after undergoing oral surgery. She was found to have a BP of 218/113 at the oral surgeon's office at sent to [facility] for further evaluation. found to have a BP of 188/82 (previously 218/113 at her oral surgeon).  she had stopped taking her prn hydralazine all together.   She did complain of chest pain during her admission with negative TnI - felt to be atypical in nature.   On a prior clinic visit she was taking methyldopa and felt that she was having side effects including nausea and vomiting. Also with malaise and fatigue. She requested to go back on clonidine. Clonidine was restarted at a low dose as she was taking previously. She states having symptoms on a low dose clonidine despite having taken this before. Again symptoms as above. We changed to Cardura 2 mg in the evening. She didn't have side effects on the medication.   normal nuclear stress test in March 2011.   echocardiogram in 2009  showing impaired relaxation and mild aortic sclerosis.   hospitalized at [facility] from 09/12/12 until 09/15/12.  She was hospitalized because of difficulty walking, found to have orthostatic hypotension as well as severe hypertension at times.  She had an MRI which was negative.     Allergies  Allergen Reactions  . Amlodipine Nausea Only  . Aspirin   . Bystolic [Nebivolol Hcl]     Vision problem  . Calan [Verapamil Hcl]     Weakness and staggering  . Cardura [Doxazosin Mesylate]   . Cephalosporins   . Clonidine Derivatives     Facial swelling   . Codeine   . Cozaar   . Doxazosin   . Elavil [Amitriptyline Hcl]   . Erythromycin   . Gantrisin [Sulfisoxazole]   . Hctz [Hydrochlorothiazide]   . Iodides   . Lopressor [Metoprolol Tartrate]   . Methyldopa     Facial swelling   . Micardis [Telmisartan]   . Morphine And Related   . Nizatidine   . Nsaids   . Pantoprazole Sodium   . Penicillins   . Prilosec [Omeprazole]   . Promethazine Hcl   . Streptomycin   . Sulfa Drugs Cross Reactors   . Tequin   . Tetracyclines & Related   . Toprol Xl [Metoprolol Succinate]   . Ultram [Tramadol]   . Valium   . Valturna [Aliskiren-Valsartan] Nausea And Vomiting    Current Outpatient Prescriptions on File Prior to Visit  Medication Sig Dispense Refill  . DIGITEK 125 MCG tablet TAKE ONE-HALF TABLET ON  MONDAY; WEDNESDAY AND FRIDAY TAKE 1 TABLET ON ON TUES; THURS; SAT; AND SUN. 90 tablet 12  . dimenhyDRINATE (DRAMAMINE) 50 MG tablet Take 50 mg by mouth every 8 (eight) hours as needed (for nausea).    . fluticasone (FLONASE) 50 MCG/ACT nasal spray Place into both nostrils as needed for allergies or rhinitis.    Marland Kitchen nitroGLYCERIN (NITROSTAT) 0.4 MG SL tablet Place 0.4 mg under the tongue every 5 (five) minutes as needed for chest pain.    . sodium chloride (OCEAN) 0.65 % nasal spray Place 1 spray into the nose as needed for congestion.    . triamcinolone (NASACORT ALLERGY 24HR) 55  MCG/ACT AERO nasal inhaler Place 2 sprays into the nose daily.     No current facility-administered medications on file prior to visit.    Past Medical History  Diagnosis Date  . Hypertension     a. labile HTN  . Atypical chest pain     a. nuclear stress test 04/2010: normal; b. echo 2009: EF 55-60%, mild LVH, impaired LV relaxation, mild aortic sclerosis, mild TR, nl PASP, since echo 2007 no significant change   . History of spinal stenosis     underwent back surgery on 12/03/09 by Dr. Glenna Fellows  . Hyperlipidemia   . Vertigo     positional vertigo  . Dizziness   . Muscle spasm   . Numbness in feet   . Dyspepsia   . Fatigue   . Headaches, cluster   . Joint pain   . Worsening vision   . Shortness of breath   . Heart palpitations   . Nausea and/or vomiting     with walking  . Hypercholesterolemia     with inability to tolerate statin therapy  . Mitral valve prolapse   . History of liver disease     history of liver trouble, has not had any known esposures to jaundiced person  . History of rheumatic fever     as a child  . 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 Medication side effects     a. 30+ medication allergies and/or intolerances; b. she will not take daily antihypertensives    Past Surgical History  Procedure Laterality Date  . Tonsillectomy    . Breast biopsy    . Bladder fulguration    . Cholecystectomy    . Total abdominal hysterectomy    . Hemorrhoid surgery    . Cervical fusion    . Carpal tunnel release      bilaterally  . Lumbar laminectomy    . Trigger finger release      x2  . Tooth extraction      Social History  reports that she has never smoked. She has never used smokeless tobacco. She reports that she does not drink alcohol or use illicit drugs.  Family History family history includes Heart attack in her father; Heart failure in her mother; Hypertension in her father.   Review of Systems  Constitutional: Negative.    Respiratory: Negative.   Cardiovascular: Negative.   Gastrointestinal: Negative.   Musculoskeletal: Positive for gait problem.  Skin: Negative.   Neurological: Negative.   Hematological: Negative.   Psychiatric/Behavioral: Negative.   All other systems reviewed and are negative.   BP 148/60 mmHg  Pulse 85  Ht 5\' 4"  (1.626 m)  Wt 113 lb (51.256 kg)  BMI 19.39 kg/m2  Physical Exam  Constitutional: She is oriented to person, place, and time. She appears well-developed  and well-nourished.  HENT:  Head: Normocephalic.  Nose: Nose normal.  Mouth/Throat: Oropharynx is clear and moist.  Eyes: Conjunctivae are normal. Pupils are equal, round, and reactive to light.  Neck: Normal range of motion. Neck supple. No JVD present.  Cardiovascular: Normal rate, regular rhythm, normal heart sounds and intact distal pulses.  Exam reveals no gallop and no friction rub.   No murmur heard. Pulmonary/Chest: Effort normal and breath sounds normal. No respiratory distress. She has no wheezes. She has no rales. She exhibits no tenderness.  Abdominal: Soft. Bowel sounds are normal. She exhibits no distension. There is no tenderness.  Musculoskeletal: Normal range of motion. She exhibits no edema or tenderness.  Lymphadenopathy:    She has no cervical adenopathy.  Neurological: She is alert and oriented to person, place, and time. Coordination normal.  Skin: Skin is warm and dry. No rash noted. No erythema.  Psychiatric: She has a normal mood and affect. Her behavior is normal. Judgment and thought content normal.

## 2015-06-03 NOTE — Assessment & Plan Note (Signed)
Long list of medication allergies, seems to be tolerating hydralazine well with no problem Encouraged her to take extra hydralazine as needed for systolic pressure greater than 170 She takes hydralazine 25 mg 4 times a day currently

## 2015-06-03 NOTE — Patient Instructions (Signed)
You are doing well. No medication changes were made.  Continue on your currents medications Take an extra pill for blood pressure >170  Please call us if you have new issues that need to be addressed before your next appt.  Your physician wants you to follow-up in: 12 months.  You will receive a reminder letter in the mail two months in advance. If you don't receive a letter, please call our office to schedule the follow-up appointment.

## 2015-07-28 ENCOUNTER — Other Ambulatory Visit: Payer: Self-pay | Admitting: Family Medicine

## 2015-08-20 ENCOUNTER — Ambulatory Visit (INDEPENDENT_AMBULATORY_CARE_PROVIDER_SITE_OTHER): Payer: Medicare Other | Admitting: Podiatry

## 2015-08-20 ENCOUNTER — Encounter: Payer: Self-pay | Admitting: Podiatry

## 2015-08-20 DIAGNOSIS — B351 Tinea unguium: Secondary | ICD-10-CM | POA: Diagnosis not present

## 2015-08-20 DIAGNOSIS — M79676 Pain in unspecified toe(s): Secondary | ICD-10-CM

## 2015-08-20 NOTE — Progress Notes (Signed)
She presents today with a chief complaint of painful elongated toenails she states that 3 months is just too long.  Objective: Vital signs stable alert and oriented 3. Pulses are strongly palpable. Toenails are thick yellow dystrophic with mycotic severely elongated brittle and painful palpation as well as debridement.  Assessment: Pain limb secondary to onychomycosis 1 through 5 bilateral.  Plan: Debridement toenails 1 through 5 bilateral covered service secondary to pain.

## 2015-08-27 ENCOUNTER — Ambulatory Visit: Payer: Medicare Other | Admitting: Podiatry

## 2015-09-08 ENCOUNTER — Telehealth: Payer: Self-pay | Admitting: Family Medicine

## 2015-09-08 NOTE — Telephone Encounter (Signed)
She is to take an extra Hydralazine for systolic BPs greater than 180./   Otherwise she is tio take 4 daily unless BP drops <130, then she should leave off 1 day.-jh

## 2015-09-08 NOTE — Telephone Encounter (Signed)
Pt's daughter Clarise Cruz advised as per Dr. Luan Pulling.

## 2015-09-08 NOTE — Telephone Encounter (Signed)
Pt  Daughter called wanted to know about extra BP  Pill (was told pt need to take  And extra pill) Clarise Cruz call back # is   [phone]

## 2015-09-09 ENCOUNTER — Ambulatory Visit: Payer: Medicare Other | Admitting: Family Medicine

## 2015-09-10 ENCOUNTER — Telehealth: Payer: Self-pay | Admitting: Family Medicine

## 2015-09-10 ENCOUNTER — Inpatient Hospital Stay
Admission: EM | Admit: 2015-09-10 | Discharge: 2015-09-12 | DRG: 305 | Disposition: A | Discharge status: Home Health | Payer: Medicare Other | Attending: Internal Medicine | Admitting: Internal Medicine

## 2015-09-10 DIAGNOSIS — I160 Hypertensive urgency: Secondary | ICD-10-CM | POA: Diagnosis not present

## 2015-09-10 DIAGNOSIS — Z888 Allergy status to other drugs, medicaments and biological substances status: Secondary | ICD-10-CM

## 2015-09-10 DIAGNOSIS — I341 Nonrheumatic mitral (valve) prolapse: Secondary | ICD-10-CM | POA: Diagnosis present

## 2015-09-10 DIAGNOSIS — R51 Headache: Secondary | ICD-10-CM | POA: Diagnosis not present

## 2015-09-10 DIAGNOSIS — Z66 Do not resuscitate: Secondary | ICD-10-CM | POA: Diagnosis not present

## 2015-09-10 DIAGNOSIS — Z79899 Other long term (current) drug therapy: Secondary | ICD-10-CM

## 2015-09-10 DIAGNOSIS — R7989 Other specified abnormal findings of blood chemistry: Secondary | ICD-10-CM | POA: Diagnosis not present

## 2015-09-10 DIAGNOSIS — R4182 Altered mental status, unspecified: Secondary | ICD-10-CM

## 2015-09-10 DIAGNOSIS — E871 Hypo-osmolality and hyponatremia: Secondary | ICD-10-CM | POA: Diagnosis not present

## 2015-09-10 DIAGNOSIS — M4800 Spinal stenosis, site unspecified: Secondary | ICD-10-CM | POA: Diagnosis present

## 2015-09-10 DIAGNOSIS — Z7901 Long term (current) use of anticoagulants: Secondary | ICD-10-CM

## 2015-09-10 DIAGNOSIS — E785 Hyperlipidemia, unspecified: Secondary | ICD-10-CM | POA: Diagnosis not present

## 2015-09-10 DIAGNOSIS — Z887 Allergy status to serum and vaccine status: Secondary | ICD-10-CM

## 2015-09-10 DIAGNOSIS — R0789 Other chest pain: Secondary | ICD-10-CM | POA: Diagnosis present

## 2015-09-10 DIAGNOSIS — I248 Other forms of acute ischemic heart disease: Secondary | ICD-10-CM | POA: Diagnosis not present

## 2015-09-10 DIAGNOSIS — R778 Other specified abnormalities of plasma proteins: Secondary | ICD-10-CM

## 2015-09-10 DIAGNOSIS — R079 Chest pain, unspecified: Secondary | ICD-10-CM | POA: Diagnosis not present

## 2015-09-10 DIAGNOSIS — I10 Essential (primary) hypertension: Secondary | ICD-10-CM | POA: Diagnosis not present

## 2015-09-10 DIAGNOSIS — D72829 Elevated white blood cell count, unspecified: Secondary | ICD-10-CM | POA: Diagnosis present

## 2015-09-10 DIAGNOSIS — Z881 Allergy status to other antibiotic agents status: Secondary | ICD-10-CM

## 2015-09-10 DIAGNOSIS — Z886 Allergy status to analgesic agent status: Secondary | ICD-10-CM

## 2015-09-10 DIAGNOSIS — M2550 Pain in unspecified joint: Secondary | ICD-10-CM | POA: Diagnosis present

## 2015-09-10 DIAGNOSIS — E7800 Pure hypercholesterolemia, unspecified: Secondary | ICD-10-CM | POA: Diagnosis present

## 2015-09-10 DIAGNOSIS — E876 Hypokalemia: Secondary | ICD-10-CM | POA: Diagnosis present

## 2015-09-10 DIAGNOSIS — R200 Anesthesia of skin: Secondary | ICD-10-CM | POA: Diagnosis present

## 2015-09-10 DIAGNOSIS — Z885 Allergy status to narcotic agent status: Secondary | ICD-10-CM

## 2015-09-10 DIAGNOSIS — R402441 Other coma, without documented Glasgow coma scale score, or with partial score reported, in the field [EMT or ambulance]: Secondary | ICD-10-CM | POA: Diagnosis not present

## 2015-09-10 DIAGNOSIS — R410 Disorientation, unspecified: Secondary | ICD-10-CM | POA: Diagnosis present

## 2015-09-10 DIAGNOSIS — Z880 Allergy status to penicillin: Secondary | ICD-10-CM

## 2015-09-10 DIAGNOSIS — Z7951 Long term (current) use of inhaled steroids: Secondary | ICD-10-CM

## 2015-09-10 NOTE — ED Notes (Signed)
Pt via ems from home  for altered mental status, daughter called EMS when pt states she was having CP. Pt will not answer questions during triage. EMS states pt is uncooperative. Pt alert at this time. VS stable

## 2015-09-10 NOTE — Telephone Encounter (Signed)
As per [name] she is having lots confusion and UTI is one of the Sx for elderly confusion and since flu is going around pt doesn't want to be seen right now with all these infection so sara's question is can she get order from out office for out pt U/A and Urine culture for hospital lab ? Can call [name] with Dr. Luan Pulling  Reply.

## 2015-09-10 NOTE — Telephone Encounter (Signed)
Pt  Daughter called Tsosie Billing # [phone] wanted someone to call she have questions/ also wanted to know if she could drop off urine

## 2015-09-11 ENCOUNTER — Other Ambulatory Visit: Payer: Self-pay | Admitting: Family Medicine

## 2015-09-11 ENCOUNTER — Inpatient Hospital Stay (HOSPITAL_COMMUNITY)
Admit: 2015-09-11 | Discharge: 2015-09-11 | Disposition: A | Discharge status: Home / Self Care | Payer: Medicare Other | Attending: Internal Medicine | Admitting: Internal Medicine

## 2015-09-11 ENCOUNTER — Encounter: Payer: Self-pay | Admitting: Emergency Medicine

## 2015-09-11 ENCOUNTER — Emergency Department: Payer: Medicare Other

## 2015-09-11 ENCOUNTER — Inpatient Hospital Stay: Admit: 2015-09-11 | Payer: Medicare Other

## 2015-09-11 DIAGNOSIS — Z7901 Long term (current) use of anticoagulants: Secondary | ICD-10-CM | POA: Diagnosis not present

## 2015-09-11 DIAGNOSIS — R51 Headache: Secondary | ICD-10-CM | POA: Diagnosis not present

## 2015-09-11 DIAGNOSIS — Z7951 Long term (current) use of inhaled steroids: Secondary | ICD-10-CM | POA: Diagnosis not present

## 2015-09-11 DIAGNOSIS — Z886 Allergy status to analgesic agent status: Secondary | ICD-10-CM | POA: Diagnosis not present

## 2015-09-11 DIAGNOSIS — R079 Chest pain, unspecified: Secondary | ICD-10-CM

## 2015-09-11 DIAGNOSIS — R410 Disorientation, unspecified: Secondary | ICD-10-CM | POA: Diagnosis present

## 2015-09-11 DIAGNOSIS — E876 Hypokalemia: Secondary | ICD-10-CM | POA: Diagnosis present

## 2015-09-11 DIAGNOSIS — M4800 Spinal stenosis, site unspecified: Secondary | ICD-10-CM | POA: Diagnosis present

## 2015-09-11 DIAGNOSIS — Z880 Allergy status to penicillin: Secondary | ICD-10-CM | POA: Diagnosis not present

## 2015-09-11 DIAGNOSIS — Z881 Allergy status to other antibiotic agents status: Secondary | ICD-10-CM | POA: Diagnosis not present

## 2015-09-11 DIAGNOSIS — I248 Other forms of acute ischemic heart disease: Secondary | ICD-10-CM | POA: Diagnosis present

## 2015-09-11 DIAGNOSIS — I160 Hypertensive urgency: Secondary | ICD-10-CM | POA: Diagnosis present

## 2015-09-11 DIAGNOSIS — R4182 Altered mental status, unspecified: Secondary | ICD-10-CM

## 2015-09-11 DIAGNOSIS — Z888 Allergy status to other drugs, medicaments and biological substances status: Secondary | ICD-10-CM | POA: Diagnosis not present

## 2015-09-11 DIAGNOSIS — E785 Hyperlipidemia, unspecified: Secondary | ICD-10-CM | POA: Diagnosis present

## 2015-09-11 DIAGNOSIS — I341 Nonrheumatic mitral (valve) prolapse: Secondary | ICD-10-CM | POA: Diagnosis present

## 2015-09-11 DIAGNOSIS — Z66 Do not resuscitate: Secondary | ICD-10-CM | POA: Diagnosis present

## 2015-09-11 DIAGNOSIS — R7989 Other specified abnormal findings of blood chemistry: Secondary | ICD-10-CM | POA: Diagnosis not present

## 2015-09-11 DIAGNOSIS — D72829 Elevated white blood cell count, unspecified: Secondary | ICD-10-CM | POA: Diagnosis present

## 2015-09-11 DIAGNOSIS — M2550 Pain in unspecified joint: Secondary | ICD-10-CM | POA: Diagnosis present

## 2015-09-11 DIAGNOSIS — N390 Urinary tract infection, site not specified: Secondary | ICD-10-CM

## 2015-09-11 DIAGNOSIS — Z885 Allergy status to narcotic agent status: Secondary | ICD-10-CM | POA: Diagnosis not present

## 2015-09-11 DIAGNOSIS — Z79899 Other long term (current) drug therapy: Secondary | ICD-10-CM | POA: Diagnosis not present

## 2015-09-11 DIAGNOSIS — R200 Anesthesia of skin: Secondary | ICD-10-CM | POA: Diagnosis present

## 2015-09-11 DIAGNOSIS — E871 Hypo-osmolality and hyponatremia: Secondary | ICD-10-CM | POA: Diagnosis present

## 2015-09-11 DIAGNOSIS — Z887 Allergy status to serum and vaccine status: Secondary | ICD-10-CM | POA: Diagnosis not present

## 2015-09-11 DIAGNOSIS — R0789 Other chest pain: Secondary | ICD-10-CM | POA: Diagnosis present

## 2015-09-11 DIAGNOSIS — I10 Essential (primary) hypertension: Secondary | ICD-10-CM | POA: Diagnosis present

## 2015-09-11 DIAGNOSIS — E7800 Pure hypercholesterolemia, unspecified: Secondary | ICD-10-CM | POA: Diagnosis present

## 2015-09-11 LAB — COMPREHENSIVE METABOLIC PANEL
ALBUMIN: 4.3 g/dL (ref 3.5–5.0)
ALT: 33 U/L (ref 14–54)
AST: 31 U/L (ref 15–41)
Alkaline Phosphatase: 65 U/L (ref 38–126)
Anion gap: 11 (ref 5–15)
BUN: 16 mg/dL (ref 6–20)
CHLORIDE: 102 mmol/L (ref 101–111)
CO2: 19 mmol/L — ABNORMAL LOW (ref 22–32)
Calcium: 9.1 mg/dL (ref 8.9–10.3)
Creatinine, Ser: 0.66 mg/dL (ref 0.44–1.00)
GFR calc Af Amer: >60 mL/min (ref >60)
GFR calc non Af Amer: >60 mL/min (ref >60)
GLUCOSE: 108 mg/dL — AB (ref 65–99)
POTASSIUM: 3.4 mmol/L — AB (ref 3.5–5.1)
SODIUM: 132 mmol/L — AB (ref 135–145)
Total Bilirubin: 0.8 mg/dL (ref 0.3–1.2)
Total Protein: 7.2 g/dL (ref 6.5–8.1)

## 2015-09-11 LAB — URINALYSIS COMPLETE WITH MICROSCOPIC (ARMC ONLY)
BACTERIA UA: NONE SEEN
Bilirubin Urine: NEGATIVE
Glucose, UA: NEGATIVE mg/dL
Hgb urine dipstick: NEGATIVE
Leukocytes, UA: NEGATIVE
NITRITE: NEGATIVE
PH: 7 (ref 5.0–8.0)
PROTEIN: NEGATIVE mg/dL
SPECIFIC GRAVITY, URINE: 1.008 (ref 1.005–1.030)
Squamous Epithelial / LPF: NONE SEEN

## 2015-09-11 LAB — CBC
HEMATOCRIT: 41.6 % (ref 35.0–47.0)
HEMATOCRIT: 42.3 % (ref 35.0–47.0)
HEMOGLOBIN: 14.3 g/dL (ref 12.0–16.0)
Hemoglobin: 14.3 g/dL (ref 12.0–16.0)
MCH: 31.4 pg (ref 26.0–34.0)
MCH: 32.3 pg (ref 26.0–34.0)
MCHC: 33.8 g/dL (ref 32.0–36.0)
MCHC: 34.3 g/dL (ref 32.0–36.0)
MCV: 92.7 fL (ref 80.0–100.0)
MCV: 94.1 fL (ref 80.0–100.0)
Platelets: 260 10*3/uL (ref 150–440)
Platelets: 279 10*3/uL (ref 150–440)
RBC: 4.42 MIL/uL (ref 3.80–5.20)
RBC: 4.56 MIL/uL (ref 3.80–5.20)
RDW: 13.1 % (ref 11.5–14.5)
RDW: 13.4 % (ref 11.5–14.5)
WBC: 15.5 10*3/uL — AB (ref 3.6–11.0)
WBC: 18.8 10*3/uL — ABNORMAL HIGH (ref 3.6–11.0)

## 2015-09-11 LAB — TROPONIN I
TROPONIN I: 0.08 ng/mL — AB (ref <0.031)
TROPONIN I: 0.09 ng/mL — AB (ref <0.031)
Troponin I: 0.05 ng/mL — ABNORMAL HIGH (ref <0.031)
Troponin I: 0.06 ng/mL — ABNORMAL HIGH (ref <0.031)

## 2015-09-11 LAB — BASIC METABOLIC PANEL
ANION GAP: 10 (ref 5–15)
BUN: 18 mg/dL (ref 6–20)
CALCIUM: 9.1 mg/dL (ref 8.9–10.3)
CO2: 19 mmol/L — ABNORMAL LOW (ref 22–32)
Chloride: 103 mmol/L (ref 101–111)
Creatinine, Ser: 0.69 mg/dL (ref 0.44–1.00)
GFR calc Af Amer: >60 mL/min (ref >60)
GLUCOSE: 127 mg/dL — AB (ref 65–99)
Potassium: 4 mmol/L (ref 3.5–5.1)
SODIUM: 132 mmol/L — AB (ref 135–145)

## 2015-09-11 MED ORDER — HYDRALAZINE HCL 20 MG/ML IJ SOLN
10.0000 mg | INTRAMUSCULAR | Status: DC | PRN
  Administered 2015-09-11 (×5): 10 mg via INTRAVENOUS
  Filled 2015-09-11 (×5): qty 1

## 2015-09-11 MED ORDER — HYDRALAZINE HCL 20 MG/ML IJ SOLN
5.0000 mg | Freq: Once | INTRAMUSCULAR | Status: AC
  Administered 2015-09-11: 5 mg via INTRAVENOUS
  Filled 2015-09-11: qty 1

## 2015-09-11 MED ORDER — FLUTICASONE PROPIONATE 50 MCG/ACT NA SUSP
1.0000 | Freq: Every day | NASAL | Status: DC
  Filled 2015-09-11: qty 16

## 2015-09-11 MED ORDER — HALOPERIDOL LACTATE 5 MG/ML IJ SOLN
1.0000 mg | Freq: Four times a day (QID) | INTRAMUSCULAR | Status: DC | PRN
  Administered 2015-09-11: 1 mg via INTRAVENOUS
  Filled 2015-09-11: qty 1

## 2015-09-11 MED ORDER — HYDRALAZINE HCL 25 MG PO TABS
50.0000 mg | ORAL_TABLET | Freq: Three times a day (TID) | ORAL | Status: DC
  Filled 2015-09-11: qty 2

## 2015-09-11 MED ORDER — ACETAMINOPHEN 325 MG PO TABS: 650.0000 mg | ORAL_TABLET | ORAL | Status: DC | PRN

## 2015-09-11 MED ORDER — HALOPERIDOL LACTATE 5 MG/ML IJ SOLN
1.0000 mg | Freq: Once | INTRAMUSCULAR | Status: AC
  Administered 2015-09-11: 1 mg via INTRAVENOUS

## 2015-09-11 MED ORDER — SODIUM CHLORIDE 0.9 % IV SOLN
INTRAVENOUS | Status: DC
  Administered 2015-09-11: 15:00:00 via INTRAVENOUS

## 2015-09-11 MED ORDER — CLOPIDOGREL BISULFATE 75 MG PO TABS
75.0000 mg | ORAL_TABLET | Freq: Every day | ORAL | Status: DC
  Filled 2015-09-11: qty 1

## 2015-09-11 MED ORDER — SODIUM CHLORIDE 0.9% FLUSH
3.0000 mL | Freq: Two times a day (BID) | INTRAVENOUS | Status: DC
  Administered 2015-09-11: 3 mL via INTRAVENOUS

## 2015-09-11 MED ORDER — ENOXAPARIN SODIUM 40 MG/0.4ML ~~LOC~~ SOLN
40.0000 mg | SUBCUTANEOUS | Status: DC
  Administered 2015-09-11 – 2015-09-12 (×2): 40 mg via SUBCUTANEOUS
  Filled 2015-09-11 (×2): qty 0.4

## 2015-09-11 MED ORDER — HALOPERIDOL LACTATE 5 MG/ML IJ SOLN
INTRAMUSCULAR | Status: AC
  Filled 2015-09-11: qty 1

## 2015-09-11 MED ORDER — ONDANSETRON HCL 4 MG/2ML IJ SOLN: 4.0000 mg | Freq: Four times a day (QID) | INTRAMUSCULAR | Status: DC | PRN

## 2015-09-11 MED ORDER — HYDRALAZINE HCL 25 MG PO TABS: 25.0000 mg | ORAL_TABLET | Freq: Four times a day (QID) | ORAL | Status: DC

## 2015-09-11 MED ORDER — NITROGLYCERIN 2 % TD OINT
0.2500 [in_us] | TOPICAL_OINTMENT | Freq: Once | TRANSDERMAL | Status: AC
  Administered 2015-09-11: 0.25 [in_us] via TOPICAL
  Filled 2015-09-11: qty 1

## 2015-09-11 MED ORDER — SODIUM CHLORIDE 0.9 % IV SOLN: 250.0000 mL | INTRAVENOUS | Status: DC | PRN

## 2015-09-11 MED ORDER — POTASSIUM CHLORIDE CRYS ER 20 MEQ PO TBCR: 20.0000 meq | EXTENDED_RELEASE_TABLET | Freq: Once | ORAL | Status: DC

## 2015-09-11 MED ORDER — SODIUM CHLORIDE 0.9% FLUSH: 3.0000 mL | INTRAVENOUS | Status: DC | PRN

## 2015-09-11 MED ORDER — ACETAMINOPHEN 325 MG PO TABS
650.0000 mg | ORAL_TABLET | Freq: Once | ORAL | Status: DC
  Filled 2015-09-11: qty 2

## 2015-09-11 MED ORDER — NITROGLYCERIN 0.4 MG SL SUBL: 0.4000 mg | SUBLINGUAL_TABLET | SUBLINGUAL | Status: DC | PRN

## 2015-09-11 MED ORDER — DIGOXIN 125 MCG PO TABS
0.1250 mg | ORAL_TABLET | Freq: Every day | ORAL | Status: DC
  Administered 2015-09-12: 09:00:00 0.125 mg via ORAL
  Filled 2015-09-11: qty 1

## 2015-09-11 MED ORDER — PHENOL 1.4 % MT LIQD
1.0000 | OROMUCOSAL | Status: DC | PRN
  Administered 2015-09-11 – 2015-09-12 (×2): 1 via OROMUCOSAL
  Filled 2015-09-11: qty 177

## 2015-09-11 NOTE — ED Notes (Signed)
Pt moving all over bed and shaking arms and legs.  Pt seems to quiet down when RN talking in room with family. Will not answer questions but when speaking to family pt states "i don't even know when I am talking, I don't remember what i did last night".

## 2015-09-11 NOTE — Progress Notes (Addendum)
[facility] [place] NAME: [name]    MR#:  [id number]  DATE OF BIRTH:  06-18-1920  SUBJECTIVE:  CHIEF COMPLAINT:   Chief Complaint  Patient presents with  . Altered Mental Status  . Chest Pain   the patient closed her eyes, did not follow commands, but she looks like understand the conversation between her son and me.   REVIEW OF SYSTEMS:  Unable to obtain.   DRUG ALLERGIES:   Allergies  Allergen Reactions  . Amlodipine Nausea Only  . Aspirin   . Bystolic [Nebivolol Hcl]     Vision problem  . Calan [Verapamil Hcl]     Weakness and staggering  . Cardura [Doxazosin Mesylate]   . Cephalosporins   . Clonidine Derivatives     Facial swelling   . Codeine   . Cozaar   . Doxazosin   . Elavil [Amitriptyline Hcl]   . Erythromycin   . Gantrisin [Sulfisoxazole]   . Hctz [Hydrochlorothiazide]   . Iodides   . Lopressor [Metoprolol Tartrate]   . Methyldopa     Facial swelling   . Micardis [Telmisartan]   . Morphine And Related   . Nizatidine   . Nsaids   . Pantoprazole Sodium   . Penicillins   . Prilosec [Omeprazole]   . Promethazine Hcl   . Streptomycin   . Sulfa Drugs Cross Reactors   . Tequin   . Tetracyclines & Related   . Toprol Xl [Metoprolol Succinate]   . Ultram [Tramadol]   . Valium   . Valturna [Aliskiren-Valsartan] Nausea And Vomiting    VITALS:  Blood pressure 174/71, pulse 103, temperature 98.7 F (37.1 C), temperature source Oral, resp. rate 20, height 5\' 2"  (1.575 m), weight 48.535 kg (107 lb), SpO2 96 %.  PHYSICAL EXAMINATION:  GENERAL:  82 y.o.-year-old patient lying in the bed with no acute distress.  EYES: Close her eyes. Extraocular muscles intact.  HEENT: Head atraumatic, normocephalic.  NECK:  Supple, no jugular venous distention. No thyroid enlargement, no tenderness.  LUNGS: Normal breath sounds bilaterally, no wheezing, rales,rhonchi or crepitation. No use of accessory muscles  of respiration.  CARDIOVASCULAR: S1, S2 normal. No murmurs, rubs, or gallops.  ABDOMEN: Soft, nontender, nondistended. Bowel sounds present. No organomegaly or mass.  EXTREMITIES: No pedal edema, cyanosis, or clubbing.  NEUROLOGIC: Unable to exam..  PSYCHIATRIC: The patient is awake but closed her eyes.  SKIN: No obvious rash, lesion, or ulcer.    LABORATORY PANEL:   CBC  Recent Labs Lab 09/11/15 0956  WBC 15.5*  HGB 14.3  HCT 42.3  PLT 279   ------------------------------------------------------------------------------------------------------------------  Chemistries   Recent Labs Lab 09/11/15 0007 09/11/15 0956  NA 132* 132*  K 3.4* 4.0  CL 102 103  CO2 19* 19*  GLUCOSE 108* 127*  BUN 16 18  CREATININE 0.66 0.69  CALCIUM 9.1 9.1  AST 31  --   ALT 33  --   ALKPHOS 65  --   BILITOT 0.8  --    ------------------------------------------------------------------------------------------------------------------  Cardiac Enzymes  Recent Labs Lab 09/11/15 1447  TROPONINI 0.08*   ------------------------------------------------------------------------------------------------------------------  RADIOLOGY:  Dg Chest 2 View  09/11/2015  CLINICAL DATA:  Initial evaluation for acute chest pain. EXAM: CHEST  2 VIEW COMPARISON:  Prior study from 09/12/2012. FINDINGS: Cardiac and mediastinal silhouettes are stable in size and contour, and remain within normal limits. Tortuosity the intrathoracic aorta noted. Lungs well inflated. Mild chronic coarsening of the  interstitial markings. No focal infiltrates. No pulmonary edema or pleural effusion. No pneumothorax. Diffuse osteopenia present. Scoliosis noted. No acute osseous abnormality. IMPRESSION: Chronic coarsening of the interstitial markings. No active cardiopulmonary disease identified. Electronically Signed   By: Jeannine Boga M.D.   On: 09/11/2015 01:08   Ct Head Wo Contrast  09/11/2015  CLINICAL DATA:  Initial  evaluation for acute headache. EXAM: CT HEAD WITHOUT CONTRAST TECHNIQUE: Contiguous axial images were obtained from the base of the skull through the vertex without intravenous contrast. COMPARISON:  Prior study from 09/13/2012. FINDINGS: Diffuse prominence of the CSF containing spaces compatible with generalized cerebral atrophy. Mild chronic small vessel ischemic disease within the periventricular white matter. Scattered vascular calcifications within the carotid siphons. No acute large vessel territory infarct. No acute intracranial hemorrhage. No mass lesion, midline shift or mass effect. No hydrocephalus. No extra-axial fluid collection. Scalp soft tissues within normal limits. No acute abnormality about the orbits. Paranasal sinuses are clear.  No mastoid effusion. Calvarium intact. IMPRESSION: 1. No acute intracranial process. 2. Mild age-related cerebral atrophy with chronic small vessel ischemic disease. Electronically Signed   By: Jeannine Boga M.D.   On: 09/11/2015 01:40    EKG:   Orders placed or performed during the hospital encounter of 09/10/15  . ED EKG within 10 minutes  . ED EKG within 10 minutes  . EKG 12-Lead  . EKG 12-Lead    ASSESSMENT AND PLAN:   82 year old female patient with history of for hypertension, hyperlipidemia, dyspepsia presented to the emergency room with confusion and elevated blood pressure  1. Altered mental status, unclear etiology, possible second to behavior problems. I requested psych consult and discussed with Dr. Gretel Acre. Per neurologist, Dr. Tyron Russell, Findings on examination at this time are more behavioral than anything else. Although CNS is a possibility the patient has refused an LP. Per on call ID physician (requested by pt's daughter), MRI brain with contrast.  2. Accelerated hypertension. On hydralazine by mouth and IV when necessary.  4. Leukocytosis. Possible due to reaction. Better. Follow CBC. 5. Atypical chest pain with elevated  troponin. Continue Plavix, Cardiology consult. 6. Hypokalemia, improved with potassium supplement. 7. Hyponatremia. Start normal saline IV and a follow-up BMP.   I discussed with Dr. Gretel Acre and on call ID physician. All the records are reviewed and case discussed with Care Management/Social Workerr. Management plans discussed with the patient's son and they are in agreement.  CODE STATUS: DO NOT RESUSCITATE.  TOTAL TIME TAKING CARE OF THIS PATIENT: 60 minutes.  Greater than 50% time was spent on coordination of care and face-to-face counseling.  POSSIBLE D/C IN 2 DAYS, DEPENDING ON CLINICAL CONDITION.   Demetrios Loll M.D on 09/11/2015 at 4:03 PM  Between 7am to 6pm - Pager - [phone]  After 6pm go to [email or web address] - password [email or web address] [facility]  Office  [phone]  CC: Primary care physician; Dicky Doe, MD

## 2015-09-11 NOTE — Progress Notes (Signed)
Dr Bridgett Larsson made aware of troponin 0.08, acknowledged

## 2015-09-11 NOTE — Plan of Care (Signed)
Problem: Fluid Volume: Goal: Ability to maintain a balanced intake and output will improve Outcome: Progressing Pt restless and anxious at times, PRN haldol with some effectiveness, family at bedside, daughter and pt refuses pain meds, pt refusing PO meds

## 2015-09-11 NOTE — Telephone Encounter (Signed)
Called hospital lab and send order through [facility] and when called Clarise Cruz to let her know this found out that Mrs. [name] was hospitalized yesterday.

## 2015-09-11 NOTE — ED Notes (Signed)
Report received 

## 2015-09-11 NOTE — ED Notes (Signed)
Pt cannot tolerate any oral medicine at this time.  EDP notifed.

## 2015-09-11 NOTE — ED Provider Notes (Signed)
[facility] Emergency Department Provider Note  ____________________________________________  Time seen: Approximately 1:32 AM  I have reviewed the triage vital signs and the nursing notes.   HISTORY  Chief Complaint Altered Mental Status and Chest Pain  Limited by decreased LOC  HPI [name] is a 82 y.o. female who presents to the ED from home with a chief complaint of altered mental status and chest pain. Daughter noticed patient with decreased LOC over the course of today. Patient complained to her daughter she was having chest pain. Blood pressure taken at home was 0000000 systolic. On arrival to the ED patient is hypertensive to the 200s. Daughter states hydralazine is the only blood pressure medicine patient can tolerate. Denies recent fever, chills, abdominal pain, nausea, vomiting, diarrhea. Denies recent travel or trauma. Nothing makes her symptoms better or worse.  Past Medical History  Diagnosis Date  . Hypertension     a. labile HTN  . Atypical chest pain     a. nuclear stress test 04/2010: normal; b. echo 2009: EF 55-60%, mild LVH, impaired LV relaxation, mild aortic sclerosis, mild TR, nl PASP, since echo 2007 no significant change   . History of spinal stenosis     underwent back surgery on 12/03/09 by Dr. Glenna Fellows  . Hyperlipidemia   . Vertigo     positional vertigo  . Dizziness   . Muscle spasm   . Numbness in feet   . Dyspepsia   . Fatigue   . Headaches, cluster   . Joint pain   . Worsening vision   . Shortness of breath   . Heart palpitations   . Nausea and/or vomiting     with walking  . Hypercholesterolemia     with inability to tolerate statin therapy  . Mitral valve prolapse   . History of liver disease     history of liver trouble, has not had any known esposures to jaundiced person  . History of rheumatic fever     as a child  . 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 Medication side effects     a.  30+ medication allergies and/or intolerances; b. she will not take daily antihypertensives    Patient Active Problem List   Diagnosis Date Noted  . Medication side effects   . Atypical chest pain   . Palpitations 09/25/2012  . Labile blood pressure 09/15/2012  . ? Orthostatic hypotension 09/13/2012  . Gait instability 09/12/2012  . Headache(784.0) 09/12/2012  . Hypertension, accelerated 09/12/2012  . Nausea alone 06/28/2011  . Benign hypertensive heart disease without heart failure 12/14/2010  . Spinal stenosis of lumbar region 12/14/2010  . History of vertigo 12/14/2010  . Hypercholesterolemia 12/14/2010    Past Surgical History  Procedure Laterality Date  . Tonsillectomy    . Breast biopsy    . Bladder fulguration    . Cholecystectomy    . Total abdominal hysterectomy    . Hemorrhoid surgery    . Cervical fusion    . Carpal tunnel release      bilaterally  . Lumbar laminectomy    . Trigger finger release      x2  . Tooth extraction      Current Outpatient Rx  Name  Route  Sig  Dispense  Refill  . DIGITEK 125 MCG tablet      TAKE ONE-HALF TABLET ON MONDAY; WEDNESDAY AND FRIDAY TAKE 1 TABLET ON ON TUES; THURS; SAT; AND SUN.  90 tablet   12     Rx has expired - unused refills remain   . dimenhyDRINATE (DRAMAMINE) 50 MG tablet   Oral   Take 50 mg by mouth every 8 (eight) hours as needed (for nausea).         . fluticasone (FLONASE) 50 MCG/ACT nasal spray   Each Nare   Place into both nostrils as needed for allergies or rhinitis.         . hydrALAZINE (APRESOLINE) 25 MG tablet   Oral   Take 1 tablet (25 mg total) by mouth 4 (four) times daily.   120 tablet   11   . NITROSTAT 0.4 MG SL tablet      1 (ONE) TAB SUBLINGUAL, SUBLINGUAL, AS NEEDED   25 tablet   11     Rx has expired - unused refills remain   . sodium chloride (OCEAN) 0.65 % nasal spray   Nasal   Place 1 spray into the nose as needed for congestion.         . triamcinolone  (NASACORT ALLERGY 24HR) 55 MCG/ACT AERO nasal inhaler   Nasal   Place 2 sprays into the nose daily.           Allergies Amlodipine; Aspirin; Bystolic; Calan; Cardura; Cephalosporins; Clonidine derivatives; Codeine; Cozaar; Doxazosin; Elavil; Erythromycin; Gantrisin; Hctz; Iodides; Lopressor; Methyldopa; Micardis; Morphine and related; Nizatidine; Nsaids; Pantoprazole sodium; Penicillins; Prilosec; Promethazine hcl; Streptomycin; Sulfa drugs cross reactors; Tequin; Tetracyclines & related; Toprol xl; Ultram; Valium; and Valturna  Family History  Problem Relation Age of Onset  . Hypertension Father   . Heart attack Father   . Heart failure Mother     Social History Social History  Substance Use Topics  . Smoking status: Never Smoker   . Smokeless tobacco: Never Used  . Alcohol Use: No    Review of Systems  Constitutional: No fever/chills. Eyes: No visual changes. ENT: No sore throat. Cardiovascular: Positive for chest pain. Respiratory: Denies shortness of breath. Gastrointestinal: No abdominal pain.  No nausea, no vomiting.  No diarrhea.  No constipation. Genitourinary: Negative for dysuria. Musculoskeletal: Negative for back pain. Skin: Negative for rash. Neurological: Negative for headaches, focal weakness or numbness.  10-point ROS otherwise negative.  ____________________________________________   PHYSICAL EXAM:  VITAL SIGNS: ED Triage Vitals  Enc Vitals Group     BP --      Pulse --      Resp --      Temp --      Temp src --      SpO2 --      Weight --      Height --      Head Cir --      Peak Flow --      Pain Score --      Pain Loc --      Pain Edu? --      Excl. in [place]? --     Constitutional: Eyes are closed, moaning. Frail appearing and in mild acute distress. Eyes: Conjunctivae are normal. PERRL. EOMI. Head: Atraumatic. Nose: No congestion/rhinnorhea. Mouth/Throat: Mucous membranes are moist.  Oropharynx non-erythematous. Neck: No stridor.   No carotid bruits. Cardiovascular: Normal rate, regular rhythm. Grossly normal heart sounds.  Good peripheral circulation. Respiratory: Normal respiratory effort.  No retractions. Lungs CTAB. Gastrointestinal: Soft and nontender. No distention. No abdominal bruits. No CVA tenderness. Musculoskeletal: No lower extremity tenderness nor edema.  No joint effusions. Neurologic:  Confused. No gross focal  neurologic deficits are appreciated.  Skin:  Skin is warm, dry and intact. No rash noted. Psychiatric: Mood and affect are normal. Speech and behavior are normal.  ____________________________________________   LABS (all labs ordered are listed, but only abnormal results are displayed)  Labs Reviewed  COMPREHENSIVE METABOLIC PANEL - Abnormal; Notable for the following:    Sodium 132 (*)    Potassium 3.4 (*)    CO2 19 (*)    Glucose, Bld 108 (*)    All other components within normal limits  CBC - Abnormal; Notable for the following:    WBC 18.8 (*)    All other components within normal limits  TROPONIN I - Abnormal; Notable for the following:    Troponin I 0.05 (*)    All other components within normal limits  URINALYSIS COMPLETEWITH MICROSCOPIC ([facility] ONLY) - Abnormal; Notable for the following:    Color, Urine YELLOW (*)    APPearance CLEAR (*)    Ketones, ur 1+ (*)    All other components within normal limits  CBG MONITORING, ED   ____________________________________________  EKG  ED ECG REPORT I, Jariyah Hackley J, the attending physician, personally viewed and interpreted this ECG.   Date: 09/11/2015  EKG Time: 0007  Rate: 102  Rhythm: sinus tachycardia  Axis: LAD  Intervals:left anterior fascicular block  ST&T Change: Nonspecific  ____________________________________________  RADIOLOGY  CT head without contrast interpreted per Dr. Jeannine Boga: 1. No acute intracranial process. 2. Mild age-related cerebral atrophy with chronic small vessel ischemic disease.  Chest 2  view (viewed by me, interpreted per Dr. Jeannine Boga): Chronic coarsening of the interstitial markings. No active cardiopulmonary disease identified. ____________________________________________   PROCEDURES  Procedure(s) performed: None  Critical Care performed: No  ____________________________________________   INITIAL IMPRESSION / ASSESSMENT AND PLAN / ED COURSE  Pertinent labs & imaging results that were available during my care of the patient were reviewed by me and considered in my medical decision making (see chart for details).  82 year old female who presents with altered mentation and elevated blood pressure. Will obtain screening lab work including troponin, obtain CT head. Administer IV hydralazine and apply Nitropaste for hypertension.  ----------------------------------------- 1:55 AM on 09/11/2015 -----------------------------------------  Updated daughter of laboratory and imaging results. Will discuss with hospitalist to evaluate patient in the emergency department for admission. ____________________________________________   FINAL CLINICAL IMPRESSION(S) / ED DIAGNOSES  Final diagnoses:  Essential hypertension  Chest pain, unspecified chest pain type  Elevated troponin      Paulette Blanch, MD 09/11/15 [phone]

## 2015-09-11 NOTE — Consult Note (Signed)
      INFECTIOUS DISEASE ATTENDING ADDENDUM:   Date: 09/11/2015  Patient name: [name]  Medical record number: [id number]  Date of birth: 08-11-1920   I was called by the patient's daughter Ricky Ala who is in [place] at [facility]. She was concerned that the patient might be potentially suffering from an infection that could explain her abrupt change in her cognition.   Certainly the patients WBC was elevated to 18 k shortly after admission when she was  Admitted but it has dropped since. Furthermore in tracking this patients WBC over the past several years in Epic it has ranged between 10 and 15K so she appears to have a chronically elevated WBC.  With the absence of a fever or hypothermia or symptoms of significant infection I DO NOT think that a super aggressive workup for Infectious Causes of her confusion is warranted.  That being said I think that obtaining an MRI of the brain with contrast to evaluate for potential HSV 1 encephalitis is reasonable. If the patient were to have HSV encephalitis the medial aspect of one or (less typically both) temporal lobes should light up on scan.  Furthermore MRI could disclose other non ID causes for her confusion.  I have also ordered a set of blood cultures though I do not suspect they will be high yield and there will be risk of chasing a contaminant so obviously they should be followed closely and interpreted with caution if only 1/2 is positive and no MRSA,MSSA, Enterococcus Candida isolated.  We can check an ESR, CRP as well as an RPR and HIV antibody with am labs as well  I would finally entertain the possibility of getting an MRI of the lumbar spine to rule out infection there but I would prefer to see more evidence of infection such as fevers, high ESR before rushing to do an MRI L spine  I discussed this case with Dr. Bridgett Larsson the Primary.  I would certainly NOT start empiric antibiotics because there is absolutely NO ID  pathology that has been identified at this point.   Alcide Evener 09/11/2015, 5:23 PM

## 2015-09-11 NOTE — Telephone Encounter (Signed)
Yes, send order to hospital lab for UA and Urine C&S.  [name] needs to be sure results get back to our office.  -jh

## 2015-09-11 NOTE — H&P (Signed)
[facility] NAME: [name]    MR#:  [id number]  DATE OF BIRTH:  [birth year]  DATE OF ADMISSION:  09/10/2015  PRIMARY CARE PHYSICIAN: Dicky Doe, MD   REQUESTING/REFERRING PHYSICIAN:   CHIEF COMPLAINT:   Chief Complaint  Patient presents with  . Altered Mental Status  . Chest Pain    HISTORY OF PRESENT ILLNESS: [name]  is a 82 y.o. female with a known history of Hypertension, spinal stenosis, joint pain, presented to the emergency room with confusion. According to the family been confused since Sunday night. She is altered and does not know what she talks. Her blood pressure has been high and patient is very sensitive to medication. She only tolerates hydralazine for blood pressure. In the emergency room and complained of chest pain. The pain was aching in nature 4 out of 10 and non radiating. He says she does not understand things and does not know what she is communicating. Some times she says she is not even able to see things. Most of the history was obtained from patient's daughter at bedside. Systolic blood pressure was more than 200 mmHg in the emergency room. During the workup in the emergency room WBC count was high, chest x-ray did not reveal any pneumonia and urine did not show any infection. Not much history could be obtained from the patient.  PAST MEDICAL HISTORY:   Past Medical History  Diagnosis Date  . Hypertension     a. labile HTN  . Atypical chest pain     a. nuclear stress test 04/2010: normal; b. echo 2009: EF 55-60%, mild LVH, impaired LV relaxation, mild aortic sclerosis, mild TR, nl PASP, since echo 2007 no significant change   . History of spinal stenosis     underwent back surgery on 12/03/09 by Dr. Glenna Fellows  . Hyperlipidemia   . Vertigo     positional vertigo  . Dizziness   . Muscle spasm   . Numbness in feet   . Dyspepsia   . Fatigue   . Headaches, cluster   . Joint pain    . Worsening vision   . Shortness of breath   . Heart palpitations   . Nausea and/or vomiting     with walking  . Hypercholesterolemia     with inability to tolerate statin therapy  . Mitral valve prolapse   . History of liver disease     history of liver trouble, has not had any known esposures to jaundiced person  . History of rheumatic fever     as a child  . 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 Medication side effects     a. 30+ medication allergies and/or intolerances; b. she will not take daily antihypertensives    PAST SURGICAL HISTORY: Past Surgical History  Procedure Laterality Date  . Tonsillectomy    . Breast biopsy    . Bladder fulguration    . Cholecystectomy    . Total abdominal hysterectomy    . Hemorrhoid surgery    . Cervical fusion    . Carpal tunnel release      bilaterally  . Lumbar laminectomy    . Trigger finger release      x2  . Tooth extraction      SOCIAL HISTORY:  Social History  Substance Use Topics  . Smoking status: Never Smoker   . Smokeless tobacco: Never Used  . Alcohol Use:  No    FAMILY HISTORY:  Family History  Problem Relation Age of Onset  . Hypertension Father   . Heart attack Father   . Heart failure Mother     DRUG ALLERGIES:  Allergies  Allergen Reactions  . Amlodipine Nausea Only  . Aspirin   . Bystolic [Nebivolol Hcl]     Vision problem  . Calan [Verapamil Hcl]     Weakness and staggering  . Cardura [Doxazosin Mesylate]   . Cephalosporins   . Clonidine Derivatives     Facial swelling   . Codeine   . Cozaar   . Doxazosin   . Elavil [Amitriptyline Hcl]   . Erythromycin   . Gantrisin [Sulfisoxazole]   . Hctz [Hydrochlorothiazide]   . Iodides   . Lopressor [Metoprolol Tartrate]   . Methyldopa     Facial swelling   . Micardis [Telmisartan]   . Morphine And Related   . Nizatidine   . Nsaids   . Pantoprazole Sodium   . Penicillins   . Prilosec [Omeprazole]   . Promethazine Hcl    . Streptomycin   . Sulfa Drugs Cross Reactors   . Tequin   . Tetracyclines & Related   . Toprol Xl [Metoprolol Succinate]   . Ultram [Tramadol]   . Valium   . Valturna [Aliskiren-Valsartan] Nausea And Vomiting    REVIEW OF SYSTEMS:  Could not be obtaines, patient confused.  MEDICATIONS AT HOME:  Prior to Admission medications   Medication Sig Start Date End Date Taking? Authorizing Provider  DIGITEK 125 MCG tablet TAKE ONE-HALF TABLET ON MONDAY; WEDNESDAY AND FRIDAY TAKE 1 TABLET ON ON TUES; THURS; SAT; AND SUN. 01/09/15   Arlis Porta., MD  dimenhyDRINATE (DRAMAMINE) 50 MG tablet Take 50 mg by mouth every 8 (eight) hours as needed (for nausea).    Historical Provider, MD  fluticasone (FLONASE) 50 MCG/ACT nasal spray Place into both nostrils as needed for allergies or rhinitis.    Historical Provider, MD  hydrALAZINE (APRESOLINE) 25 MG tablet Take 1 tablet (25 mg total) by mouth 4 (four) times daily. 06/03/15   Minna Merritts, MD  NITROSTAT 0.4 MG SL tablet 1 (ONE) TAB SUBLINGUAL, SUBLINGUAL, AS NEEDED 07/28/15   Arlis Porta., MD  sodium chloride (OCEAN) 0.65 % nasal spray Place 1 spray into the nose as needed for congestion.    Historical Provider, MD  triamcinolone (NASACORT ALLERGY 24HR) 55 MCG/ACT AERO nasal inhaler Place 2 sprays into the nose daily.    Historical Provider, MD      PHYSICAL EXAMINATION:   VITAL SIGNS: Blood pressure 162/118, pulse 95, resp. rate 30, SpO2 99 %.  GENERAL:  82 y.o.-year-old patient lying in the bed confused EYES: Pupils equal, round, reactive to light and accommodation. No scleral icterus. Extraocular muscles intact.  HEENT: Head atraumatic, normocephalic. Oropharynx and nasopharynx clear.  NECK:  Supple, no jugular venous distention. No thyroid enlargement, no tenderness.  LUNGS: Normal breath sounds bilaterally, no wheezing, rales,rhonchi or crepitation. No use of accessory muscles of respiration.  CARDIOVASCULAR: S1, S2 normal.  No murmurs, rubs, or gallops.  ABDOMEN: Soft, nontender, nondistended. Bowel sounds present. No organomegaly or mass.  EXTREMITIES: No pedal edema, cyanosis, or clubbing.  NEUROLOGIC: confused,not completely oriented to time,place and person.Moves all extremities. PSYCHIATRIC: could not be assessed SKIN: No obvious rash, lesion, or ulcer.   LABORATORY PANEL:   CBC  Recent Labs Lab 09/11/15 0007  WBC 18.8*  HGB 14.3  HCT 41.6  PLT 260  MCV 94.1  MCH 32.3  MCHC 34.3  RDW 13.4   ------------------------------------------------------------------------------------------------------------------  Chemistries   Recent Labs Lab 09/11/15 0007  NA 132*  K 3.4*  CL 102  CO2 19*  GLUCOSE 108*  BUN 16  CREATININE 0.66  CALCIUM 9.1  AST 31  ALT 33  ALKPHOS 65  BILITOT 0.8   ------------------------------------------------------------------------------------------------------------------ CrCl cannot be calculated (Unknown ideal weight.). ------------------------------------------------------------------------------------------------------------------ No results for input(s): TSH, T4TOTAL, T3FREE, THYROIDAB in the last 72 hours.  Invalid input(s): FREET3   Coagulation profile No results for input(s): INR, PROTIME in the last 168 hours. ------------------------------------------------------------------------------------------------------------------- No results for input(s): DDIMER in the last 72 hours. -------------------------------------------------------------------------------------------------------------------  Cardiac Enzymes  Recent Labs Lab 09/11/15 0007  TROPONINI 0.05*   ------------------------------------------------------------------------------------------------------------------ Invalid input(s): POCBNP  ---------------------------------------------------------------------------------------------------------------  Urinalysis    Component Value  Date/Time   COLORURINE YELLOW* 09/11/2015 0008   COLORURINE Yellow 07/06/2011 1057   APPEARANCEUR CLEAR* 09/11/2015 0008   APPEARANCEUR Clear 07/06/2011 1057   LABSPEC 1.008 09/11/2015 0008   LABSPEC 1.008 07/06/2011 1057   PHURINE 7.0 09/11/2015 0008   PHURINE 7.0 07/06/2011 1057   GLUCOSEU NEGATIVE 09/11/2015 0008   GLUCOSEU Negative 07/06/2011 1057   HGBUR NEGATIVE 09/11/2015 0008   HGBUR 1+ 07/06/2011 1057   BILIRUBINUR NEGATIVE 09/11/2015 0008   BILIRUBINUR Negative 07/06/2011 1057   KETONESUR 1+* 09/11/2015 0008   KETONESUR Trace 07/06/2011 1057   PROTEINUR NEGATIVE 09/11/2015 0008   PROTEINUR Negative 07/06/2011 1057   UROBILINOGEN 0.2 09/12/2012 2013   NITRITE NEGATIVE 09/11/2015 0008   NITRITE Negative 07/06/2011 1057   LEUKOCYTESUR NEGATIVE 09/11/2015 0008   LEUKOCYTESUR Trace 07/06/2011 1057     RADIOLOGY: Dg Chest 2 View  09/11/2015  CLINICAL DATA:  Initial evaluation for acute chest pain. EXAM: CHEST  2 VIEW COMPARISON:  Prior study from 09/12/2012. FINDINGS: Cardiac and mediastinal silhouettes are stable in size and contour, and remain within normal limits. Tortuosity the intrathoracic aorta noted. Lungs well inflated. Mild chronic coarsening of the interstitial markings. No focal infiltrates. No pulmonary edema or pleural effusion. No pneumothorax. Diffuse osteopenia present. Scoliosis noted. No acute osseous abnormality. IMPRESSION: Chronic coarsening of the interstitial markings. No active cardiopulmonary disease identified. Electronically Signed   By: Jeannine Boga M.D.   On: 09/11/2015 01:08   Ct Head Wo Contrast  09/11/2015  CLINICAL DATA:  Initial evaluation for acute headache. EXAM: CT HEAD WITHOUT CONTRAST TECHNIQUE: Contiguous axial images were obtained from the base of the skull through the vertex without intravenous contrast. COMPARISON:  Prior study from 09/13/2012. FINDINGS: Diffuse prominence of the CSF containing spaces compatible with generalized  cerebral atrophy. Mild chronic small vessel ischemic disease within the periventricular white matter. Scattered vascular calcifications within the carotid siphons. No acute large vessel territory infarct. No acute intracranial hemorrhage. No mass lesion, midline shift or mass effect. No hydrocephalus. No extra-axial fluid collection. Scalp soft tissues within normal limits. No acute abnormality about the orbits. Paranasal sinuses are clear.  No mastoid effusion. Calvarium intact. IMPRESSION: 1. No acute intracranial process. 2. Mild age-related cerebral atrophy with chronic small vessel ischemic disease. Electronically Signed   By: Jeannine Boga M.D.   On: 09/11/2015 01:40    EKG: Orders placed or performed during the hospital encounter of 09/10/15  . ED EKG within 10 minutes  . ED EKG within 10 minutes  . EKG 12-Lead  . EKG 12-Lead    IMPRESSION AND PLAN: 82 year old female patient with history of for hypertension, hyperlipidemia, dyspepsia  presented to the emergency room with confusion and elevated blood pressure Admitting diagnosis 1. Altered mental status 2. Acute delirium 3. Hypertensive urgency 4. Leukocytosis 5. Atypical chest pain Treatment plan Admit to inpatient service CCU stepdown unit Control blood pressure with oral and IV hydralazine as needed Replace potassium Follow-up WBC count Neurology consult for altered mental status Cycle troponin to rule out ischemia DVT prophylaxis subcutaneous Lovenox 40 MG daily Supportive care.  All the records are reviewed and case discussed with ED provider. Management plans discussed with the patient, family and they are in agreement.  CODE STATUS:DNR Code Status History    Date Active Date Inactive Code Status Order ID Comments User Context   09/12/2012 11:51 PM 09/15/2012  8:07 PM Full Code [id number]  Rise Patience, MD Inpatient       TOTAL TIME TAKING CARE OF THIS PATIENT: 44 minutes.    Saundra Shelling M.D on  09/11/2015 at 2:57 AM  Between 7am to 6pm - Pager - [phone]  After 6pm go to [email or web address] - password [email or web address]  Office  [phone]  CC: Primary care physician; Dicky Doe, MD

## 2015-09-11 NOTE — Progress Notes (Signed)
Dr Bridgett Larsson also made aware of troponin 0.05 now 0.06, acknowledged

## 2015-09-11 NOTE — Progress Notes (Signed)
*  PRELIMINARY RESULTS* Echocardiogram 2D Echocardiogram has been performed.  [name] 09/11/2015, 8:14 PM

## 2015-09-11 NOTE — Consult Note (Signed)
Reason for Consult:Altered mental status Referring Physician: Bridgett Larsson  CC: Altered mental status  HPI: [name] is an 82 y.o. female who was brought to the ED after complaints of confusion and paresthesias.  The patient was first noted to have some mild confusion on Sunday night.  On Monday her daughter went to see her and noted that she was having some short term memory issues.  On Tuesday someone else stayed with her and did not note any problems.  On the day of admission the patient reported that she was confused.  Felt she was going to fall out of the bed or fall walking.  Speech at times made no sense.  She also complained of left hand and feet numbness.  Patient was brought in for evaluation at that time.  Has spells when she will shake in he upper extremities.  Does not lose consciousness with these spells.  They have been helped with Haldol.   Has been eating little. At baseline lives alone with aids there daily.  Walks with a walker.  Requires help to bathe and dress.  No incontinence.    Past Medical History  Diagnosis Date  . Hypertension     a. labile HTN  . Atypical chest pain     a. nuclear stress test 04/2010: normal; b. echo 2009: EF 55-60%, mild LVH, impaired LV relaxation, mild aortic sclerosis, mild TR, nl PASP, since echo 2007 no significant change   . History of spinal stenosis     underwent back surgery on 12/03/09 by Dr. Glenna Fellows  . Hyperlipidemia   . Vertigo     positional vertigo  . Dizziness   . Muscle spasm   . Numbness in feet   . Dyspepsia   . Fatigue   . Headaches, cluster   . Joint pain   . Worsening vision   . Shortness of breath   . Heart palpitations   . Nausea and/or vomiting     with walking  . Hypercholesterolemia     with inability to tolerate statin therapy  . Mitral valve prolapse   . History of liver disease     history of liver trouble, has not had any known esposures to jaundiced person  . History of rheumatic fever     as a child   . 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 Medication side effects     a. 30+ medication allergies and/or intolerances; b. she will not take daily antihypertensives    Past Surgical History  Procedure Laterality Date  . Tonsillectomy    . Breast biopsy    . Bladder fulguration    . Cholecystectomy    . Total abdominal hysterectomy    . Hemorrhoid surgery    . Cervical fusion    . Carpal tunnel release      bilaterally  . Lumbar laminectomy    . Trigger finger release      x2  . Tooth extraction      Family History  Problem Relation Age of Onset  . Hypertension Father   . Heart attack Father   . Heart failure Mother     Social History:  reports that she has never smoked. She has never used smokeless tobacco. She reports that she does not drink alcohol or use illicit drugs.  Allergies  Allergen Reactions  . Amlodipine Nausea Only  . Aspirin   . Bystolic [Nebivolol Hcl]     Vision problem  . Calan [  Verapamil Hcl]     Weakness and staggering  . Cardura [Doxazosin Mesylate]   . Cephalosporins   . Clonidine Derivatives     Facial swelling   . Codeine   . Cozaar   . Doxazosin   . Elavil [Amitriptyline Hcl]   . Erythromycin   . Gantrisin [Sulfisoxazole]   . Hctz [Hydrochlorothiazide]   . Iodides   . Lopressor [Metoprolol Tartrate]   . Methyldopa     Facial swelling   . Micardis [Telmisartan]   . Morphine And Related   . Nizatidine   . Nsaids   . Pantoprazole Sodium   . Penicillins   . Prilosec [Omeprazole]   . Promethazine Hcl   . Streptomycin   . Sulfa Drugs Cross Reactors   . Tequin   . Tetracyclines & Related   . Toprol Xl [Metoprolol Succinate]   . Ultram [Tramadol]   . Valium   . Valturna [Aliskiren-Valsartan] Nausea And Vomiting    Medications:  I have reviewed the patient's current medications. Prior to Admission:  Prescriptions prior to admission  Medication Sig Dispense Refill Last Dose  . digoxin (LANOXIN) 0.125 MG  tablet Take 0.0625 mg by mouth See admin instructions. On Monday, Wednesday, and Friday   09/10/2015 at am  . digoxin (LANOXIN) 0.125 MG tablet Take 0.125 mg by mouth See admin instructions. On Tuesday, Thursday, Saturday, and Sunday   09/09/2015 at am  . dimenhyDRINATE (DRAMAMINE) 50 MG tablet Take 50 mg by mouth every 8 (eight) hours as needed (for nausea).   PRN  . fluticasone (FLONASE) 50 MCG/ACT nasal spray Place 1-2 sprays into both nostrils daily as needed for allergies or rhinitis.    PRN  . hydrALAZINE (APRESOLINE) 25 MG tablet Take 1 tablet (25 mg total) by mouth 4 (four) times daily. 120 tablet 11 09/10/2015 at pm  . nitroGLYCERIN (NITROSTAT) 0.4 MG SL tablet Place 0.4 mg under the tongue every 5 (five) minutes as needed for chest pain.   PRN  . Polyethyl Glycol-Propyl Glycol (SYSTANE ULTRA PF) 0.4-0.3 % SOLN Place 1 drop into both eyes 2 (two) times daily.   09/10/2015 at pm  . Simethicone (GAS-X EXTRA STRENGTH) 125 MG CAPS Take 125 mg by mouth as needed (for gas relief).   PRN  . sodium chloride (OCEAN) 0.65 % nasal spray Place 1 spray into the nose as needed for congestion.   PRN  . triamcinolone (NASACORT ALLERGY 24HR) 55 MCG/ACT AERO nasal inhaler Place 2 sprays into the nose daily as needed (for allergies).    PRN   Scheduled: . acetaminophen  650 mg Oral Once  . [START ON 09/12/2015] clopidogrel  75 mg Oral Q breakfast  . digoxin  0.125 mg Oral Daily  . enoxaparin (LOVENOX) injection  40 mg Subcutaneous Q24H  . fluticasone  1 spray Each Nare Daily  . haloperidol lactate      . hydrALAZINE  25 mg Oral QID  . potassium chloride  20 mEq Oral Once  . sodium chloride flush  3 mL Intravenous Q12H    ROS: History obtained from family  General ROS: negative for - chills, fatigue, fever, night sweats, weight gain or weight loss Psychological ROS: negative for - behavioral disorder, hallucinations, memory difficulties, mood swings or suicidal ideation Ophthalmic ROS: negative for -  blurry vision, double vision, eye pain or loss of vision ENT ROS: pain with swallowing Allergy and Immunology ROS: negative for - hives or itchy/watery eyes Hematological and Lymphatic ROS: negative for -  bleeding problems, bruising or swollen lymph nodes Endocrine ROS: negative for - galactorrhea, hair pattern changes, polydipsia/polyuria or temperature intolerance Respiratory ROS: negative for - cough, hemoptysis, shortness of breath or wheezing Cardiovascular ROS: negative for - chest pain, dyspnea on exertion, edema or irregular heartbeat Gastrointestinal ROS: negative for - abdominal pain, diarrhea, hematemesis, nausea/vomiting or stool incontinence Genito-Urinary ROS: negative for - dysuria, hematuria, incontinence or urinary frequency/urgency Musculoskeletal ROS: negative for - joint swelling or muscular weakness Neurological ROS: as noted in HPI Dermatological ROS: negative for rash and skin lesion changes  Physical Examination: Blood pressure 178/65, pulse 100, temperature 98.4 F (36.9 C), temperature source Oral, resp. rate 24, height 5\' 2"  (1.575 m), weight 48.535 kg (107 lb), SpO2 98 %.  HEENT-  Normocephalic, no lesions, without obvious abnormality.  Normal external eye and conjunctiva.  Normal TM's bilaterally.  Normal auditory canals and external ears. Normal external nose, mucus membranes and septum.  Normal pharynx. Cardiovascular- S1, S2 normal, pulses palpable throughout   Lungs- chest clear, no wheezing, rales, normal symmetric air entry Abdomen- soft, non-tender; bowel sounds normal; no masses,  no organomegaly Extremities- no edema Lymph-cervical lymphadenopathy Musculoskeletal-no joint tenderness, deformity or swelling Skin-warm and dry, no hyperpigmentation, vitiligo, or suspicious lesions  Neurological Examination Mental Status: Alert with eyes open but closes her eyes as soon as I say that I would like to examine her.  Reports by nodding her head that she can  not follow commands but when encouraged follows simple commands.  No speech but writes what she wants to say with her finger in her daughter's hand.   Cranial Nerves: II: Discs flat bilaterally; Blinks to bilateral confrontation.  Pupils equal, round, reactive to light and accommodation III,IV, VI: ptosis not present, extra-ocular motions intact bilaterally V,VII: smile symmetric, facial light touch sensation normal bilaterally VIII: hearing normal bilaterally IX,X: gag reflex unable to be tested XI: bilateral shoulder shrug XII: midline tongue extension Motor: Lifts upper extremities against gravity and will maintain for short periods of time.  Does not lift lower extremities or maintain against gravity.  Intermittently has spells when her upper extremities will start shaking.  These are short lived and not accompanied but loss of consciousness.   Sensory: Pinprick and light touch intact throughout, bilaterally Deep Tendon Reflexes: 2+ and symmetric with absent AJ's bilaterally Plantars: Right: upgoing   Left: upgoing Cerebellar: Unable to perform Gait: not tested due to safety concerns   Laboratory Studies:   Basic Metabolic Panel:  Recent Labs Lab 09/11/15 0007 09/11/15 0956  NA 132* 132*  K 3.4* 4.0  CL 102 103  CO2 19* 19*  GLUCOSE 108* 127*  BUN 16 18  CREATININE 0.66 0.69  CALCIUM 9.1 9.1    Liver Function Tests:  Recent Labs Lab 09/11/15 0007  AST 31  ALT 33  ALKPHOS 65  BILITOT 0.8  PROT 7.2  ALBUMIN 4.3   No results for input(s): LIPASE, AMYLASE in the last 168 hours. No results for input(s): AMMONIA in the last 168 hours.  CBC:  Recent Labs Lab 09/11/15 0007 09/11/15 0956  WBC 18.8* 15.5*  HGB 14.3 14.3  HCT 41.6 42.3  MCV 94.1 92.7  PLT 260 279    Cardiac Enzymes:  Recent Labs Lab 09/11/15 0007 09/11/15 0956  TROPONINI 0.05* 0.06*    BNP: Invalid input(s): POCBNP  CBG: No results for input(s): GLUCAP in the last 168  hours.  Microbiology: Results for orders placed or performed during the hospital encounter of 09/12/12  Urine culture     Status: None   Collection Time: 09/12/12  8:13 PM  Result Value Ref Range Status   Specimen Description URINE, CLEAN CATCH  Final   Special Requests NONE  Final   Culture  Setup Time 09/13/2012 03:05  Final   Colony Count 80,000 COLONIES/ML  Final   Culture KLEBSIELLA PNEUMONIAE  Final   Report Status 09/15/2012 FINAL  Final   Organism ID, Bacteria KLEBSIELLA PNEUMONIAE  Final      Susceptibility   Klebsiella pneumoniae - MIC*    AMPICILLIN >=32 RESISTANT Resistant     CEFAZOLIN <=4 SENSITIVE Sensitive     CEFTRIAXONE <=1 SENSITIVE Sensitive     CIPROFLOXACIN <=0.25 SENSITIVE Sensitive     GENTAMICIN <=1 SENSITIVE Sensitive     LEVOFLOXACIN <=0.12 SENSITIVE Sensitive     NITROFURANTOIN 256 RESISTANT Resistant     TOBRAMYCIN <=1 SENSITIVE Sensitive     TRIMETH/SULFA <=20 SENSITIVE Sensitive     PIP/TAZO <=4 SENSITIVE Sensitive     * KLEBSIELLA PNEUMONIAE  MRSA PCR Screening     Status: None   Collection Time: 09/13/12  2:25 AM  Result Value Ref Range Status   MRSA by PCR NEGATIVE NEGATIVE Final    Comment:        The GeneXpert MRSA Assay (FDA approved for NASAL specimens only), is one component of a comprehensive MRSA colonization surveillance program. It is not intended to diagnose MRSA infection nor to guide or monitor treatment for MRSA infections.    Coagulation Studies: No results for input(s): LABPROT, INR in the last 72 hours.  Urinalysis:  Recent Labs Lab 09/11/15 0008  COLORURINE YELLOW*  LABSPEC 1.008  PHURINE 7.0  GLUCOSEU NEGATIVE  HGBUR NEGATIVE  BILIRUBINUR NEGATIVE  KETONESUR 1+*  PROTEINUR NEGATIVE  NITRITE NEGATIVE  LEUKOCYTESUR NEGATIVE    Lipid Panel:     Component Value Date/Time   CHOL 229* 09/13/2012 0410   TRIG 168* 09/13/2012 0410   HDL 38* 09/13/2012 0410   CHOLHDL 6.0 09/13/2012 0410   VLDL 34 09/13/2012  0410   LDLCALC 157* 09/13/2012 0410    HgbA1C:  Lab Results  Component Value Date   HGBA1C 5.8* 09/13/2012    Urine Drug Screen:  No results found for: LABOPIA, COCAINSCRNUR, LABBENZ, AMPHETMU, THCU, LABBARB  Alcohol Level: No results for input(s): ETH in the last 168 hours.  Other results: EKG: sinus tachycardia at 102 bpm, LAFB.  Imaging: Dg Chest 2 View  09/11/2015  CLINICAL DATA:  Initial evaluation for acute chest pain. EXAM: CHEST  2 VIEW COMPARISON:  Prior study from 09/12/2012. FINDINGS: Cardiac and mediastinal silhouettes are stable in size and contour, and remain within normal limits. Tortuosity the intrathoracic aorta noted. Lungs well inflated. Mild chronic coarsening of the interstitial markings. No focal infiltrates. No pulmonary edema or pleural effusion. No pneumothorax. Diffuse osteopenia present. Scoliosis noted. No acute osseous abnormality. IMPRESSION: Chronic coarsening of the interstitial markings. No active cardiopulmonary disease identified. Electronically Signed   By: Jeannine Boga M.D.   On: 09/11/2015 01:08   Ct Head Wo Contrast  09/11/2015  CLINICAL DATA:  Initial evaluation for acute headache. EXAM: CT HEAD WITHOUT CONTRAST TECHNIQUE: Contiguous axial images were obtained from the base of the skull through the vertex without intravenous contrast. COMPARISON:  Prior study from 09/13/2012. FINDINGS: Diffuse prominence of the CSF containing spaces compatible with generalized cerebral atrophy. Mild chronic small vessel ischemic disease within the periventricular white matter. Scattered vascular calcifications within the carotid siphons.  No acute large vessel territory infarct. No acute intracranial hemorrhage. No mass lesion, midline shift or mass effect. No hydrocephalus. No extra-axial fluid collection. Scalp soft tissues within normal limits. No acute abnormality about the orbits. Paranasal sinuses are clear.  No mastoid effusion. Calvarium intact. IMPRESSION:  1. No acute intracranial process. 2. Mild age-related cerebral atrophy with chronic small vessel ischemic disease. Electronically Signed   By: Jeannine Boga M.D.   On: 09/11/2015 01:40     Assessment/Plan: 83 year old female with altered mental status.  Findings on examination at this time are more behavioral than anything else.  There is some concern as to whether this is a reaction to pain.  Shaking episodes not consistent with seizures.  Anticonvulsant therapy not indicated.  Patient does have an elevated white blood cell count.  Afebrile but no source of infection noted.  Although CNS is a possibility the patient has refused an LP.  She does have cervical lymphadenopathy and complaints of pain swallowing and may have a viral/bacterial infectious etiology with this as the source.  A trial of antibiotics may be reasonable but due to her allergies may need ID to make a reasonable choice.    Recommendations: 1. Will continue to follow with you.    Alexis Goodell, MD Neurology [phone] 09/11/2015, 1:22 PM

## 2015-09-11 NOTE — Progress Notes (Signed)
Pt refuses to take PO meds at this time, MD aware, MD aware that pt continues to have jerking motions and restlessness, daughter states that haldol in the ED worked well for pt and daughter is requesting haldol to be ordered for pt

## 2015-09-12 ENCOUNTER — Inpatient Hospital Stay: Payer: Medicare Other

## 2015-09-12 LAB — C-REACTIVE PROTEIN: CRP: 1.2 mg/dL — AB (ref <1.0)

## 2015-09-12 LAB — BASIC METABOLIC PANEL
Anion gap: 8 (ref 5–15)
BUN: 25 mg/dL — AB (ref 6–20)
CALCIUM: 8.5 mg/dL — AB (ref 8.9–10.3)
CO2: 19 mmol/L — ABNORMAL LOW (ref 22–32)
CREATININE: 0.79 mg/dL (ref 0.44–1.00)
Chloride: 109 mmol/L (ref 101–111)
GFR calc Af Amer: >60 mL/min (ref >60)
GLUCOSE: 105 mg/dL — AB (ref 65–99)
POTASSIUM: 3.8 mmol/L (ref 3.5–5.1)
Sodium: 136 mmol/L (ref 135–145)

## 2015-09-12 LAB — SEDIMENTATION RATE: Sed Rate: 17 mm/hr (ref 0–30)

## 2015-09-12 LAB — URINALYSIS COMPLETE WITH MICROSCOPIC (ARMC ONLY)
BILIRUBIN URINE: NEGATIVE
GLUCOSE, UA: NEGATIVE mg/dL
Ketones, ur: NEGATIVE mg/dL
Nitrite: NEGATIVE
Protein, ur: NEGATIVE mg/dL
SQUAMOUS EPITHELIAL / LPF: NONE SEEN
Specific Gravity, Urine: 1.01 (ref 1.005–1.030)
pH: 6 (ref 5.0–8.0)

## 2015-09-12 LAB — CBC
HCT: 38 % (ref 35.0–47.0)
Hemoglobin: 13.1 g/dL (ref 12.0–16.0)
MCH: 32.5 pg (ref 26.0–34.0)
MCHC: 34.4 g/dL (ref 32.0–36.0)
MCV: 94.4 fL (ref 80.0–100.0)
PLATELETS: 266 10*3/uL (ref 150–440)
RBC: 4.02 MIL/uL (ref 3.80–5.20)
RDW: 13.4 % (ref 11.5–14.5)
WBC: 15.1 10*3/uL — ABNORMAL HIGH (ref 3.6–11.0)

## 2015-09-12 LAB — ECHOCARDIOGRAM COMPLETE
HEIGHTINCHES: 62 in
Weight: 1712 oz

## 2015-09-12 LAB — DIGOXIN LEVEL: Digoxin Level: 0.4 ng/mL — ABNORMAL LOW (ref 0.8–2.0)

## 2015-09-12 MED ORDER — GADOBENATE DIMEGLUMINE 529 MG/ML IV SOLN
10.0000 mL | Freq: Once | INTRAVENOUS | Status: AC | PRN
  Administered 2015-09-12: 9 mL via INTRAVENOUS

## 2015-09-12 MED ORDER — HYDRALAZINE HCL 25 MG PO TABS
25.0000 mg | ORAL_TABLET | Freq: Four times a day (QID) | ORAL | Status: DC
  Administered 2015-09-12 (×2): 25 mg via ORAL
  Filled 2015-09-12 (×3): qty 1

## 2015-09-12 NOTE — Consult Note (Signed)
Cardiology Consult    Patient ID: ZIYAN BORGA MRN: [id number], DOB/AGE: 16-Sep-1920   Admit date: 09/10/2015 Date of Consult: 09/12/2015  Primary Physician: Dicky Doe, MD Reason for Consult: Elevated Troponin; Chest Pain Primary Cardiologist: Dr. Rockey Situ Requesting Provider: Dr. Bridgett Larsson   History of Present Illness    [name] is a 82 y.o. female with past medical history of HTN, HLD, spinal stenosis, positional vertigo, and multiple medication allergies who presented to [facility] on 09/10/2015 for altered mental status and chest pain.   Upon EMS arrival her SBP was elevated in the 200's. She was confused, agitated, and unable to follow commands. While in the ED, her labs showed a WBC of 18.8. Hgb 14.3. Platelets 260. K+ 3.4. Creatinine 0.66. UA without evidence of a UTI. CXR showed mp active cardiopulmonary disease. CT Head without acute intracranial processes. Cyclic troponin values have been 0.05, 0.06, 0.08, and 0.09. EKG showed sinus tachycardia, HR 102, with no acute ST or T-wave changes.  In talking with the patient and her daughter, she reports the patient was slightly more confused on Sunday night. She lives by herself and ambulates with a walker or cane as needed. Over the next several days, her daughter noted she was more confused and on 09/10/2015, her SBP was in the 200's and she was complaining of chest discomfort and a headache, therefore she brought her to the ED for evaluation.  Her daughter says she was confused yesterday and not speaking and barely moving. A Neurology consult was obtained and they thought her symptoms were more behavioral than anything else.   This morning, the patient is conversant and joking about how her son promised to buy her multiple pairs of shoes once she gained weight. Her daughter is at the bedside and this is a "complete 180" from yesterday. She is alert and oriented to person, time, place, and situation. She denies any chest pain,  shortness of breath, palpitations, or headaches. She is very aware of her prior to admission medication regimen. She takes Hydralazine 25mg  Q4H and reports being very compliant with her regimen. She only takes the medication with food. She refused PO medications yesterday and required IV Hydralazine due to this, but she is now able to take PO. Her BP has improved to 148/69 with the most recent reading.  A repeat echocardiogram has been obtained which showed a preserved EF of 60-65% with Grade 1 DD. Her last ischemic evaluation was in 04/2014 and showed no evidence of ischemia or infarction. Her last office visit with Dr. Rockey Situ was in 05/2015 and she reported doing well at that time. She has an extensive list of HTN medications she is allergic to but has tolerated Hydralazine with no complications thus far.    Past Medical History   Past Medical History  Diagnosis Date  . Hypertension     a. labile HTN  . Atypical chest pain     a. nuclear stress test 04/2010: normal; b. echo 2009: EF 55-60%, mild LVH, impaired LV relaxation, mild aortic sclerosis, mild TR, nl PASP, since echo 2007 no significant change   . History of spinal stenosis     underwent back surgery on 12/03/09 by Dr. Glenna Fellows  . Hyperlipidemia   . Vertigo     positional vertigo  . Dizziness   . Muscle spasm   . Numbness in feet   . Dyspepsia   . Fatigue   . Headaches, cluster   . Joint pain   .  Worsening vision   . Shortness of breath   . Heart palpitations   . Nausea and/or vomiting     with walking  . Hypercholesterolemia     with inability to tolerate statin therapy  . Mitral valve prolapse   . History of liver disease     history of liver trouble, has not had any known esposures to jaundiced person  . History of rheumatic fever     as a child  . 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 Medication side effects     a. 30+ medication allergies and/or intolerances; b. she will not take daily  antihypertensives    Past Surgical History  Procedure Laterality Date  . Tonsillectomy    . Breast biopsy    . Bladder fulguration    . Cholecystectomy    . Total abdominal hysterectomy    . Hemorrhoid surgery    . Cervical fusion    . Carpal tunnel release      bilaterally  . Lumbar laminectomy    . Trigger finger release      x2  . Tooth extraction       Allergies  Allergies  Allergen Reactions  . Amlodipine Nausea Only  . Aspirin   . Bystolic [Nebivolol Hcl]     Vision problem  . Calan [Verapamil Hcl]     Weakness and staggering  . Cardura [Doxazosin Mesylate]   . Cephalosporins   . Clonidine Derivatives     Facial swelling   . Codeine   . Cozaar   . Doxazosin   . Elavil [Amitriptyline Hcl]   . Erythromycin   . Gantrisin [Sulfisoxazole]   . Hctz [Hydrochlorothiazide]   . Iodides   . Lopressor [Metoprolol Tartrate]   . Methyldopa     Facial swelling   . Micardis [Telmisartan]   . Morphine And Related   . Nizatidine   . Nsaids   . Pantoprazole Sodium   . Penicillins   . Prilosec [Omeprazole]   . Promethazine Hcl   . Streptomycin   . Sulfa Drugs Cross Reactors   . Tequin   . Tetracyclines & Related   . Toprol Xl [Metoprolol Succinate]   . Ultram [Tramadol]   . Valium   . Valturna [Aliskiren-Valsartan] Nausea And Vomiting    Inpatient Medications    . acetaminophen  650 mg Oral Once  . clopidogrel  75 mg Oral Q breakfast  . digoxin  0.125 mg Oral Daily  . enoxaparin (LOVENOX) injection  40 mg Subcutaneous Q24H  . fluticasone  1 spray Each Nare Daily  . hydrALAZINE  50 mg Oral 3 times per day  . potassium chloride  20 mEq Oral Once  . sodium chloride flush  3 mL Intravenous Q12H    Family History    Family History  Problem Relation Age of Onset  . Hypertension Father   . Heart attack Father   . Heart failure Mother     Social History    Social History   Social History  . Marital Status: Widowed    Spouse Name: N/A  . Number of  Children: N/A  . Years of Education: N/A   Occupational History  . Not on file.   Social History Main Topics  . Smoking status: Never Smoker   . Smokeless tobacco: Never Used  . Alcohol Use: No  . Drug Use: No  . Sexual Activity: Not on file   Other Topics Concern  . Not on  file   Social History Narrative     Review of Systems    General:  No chills, fever, night sweats or weight changes.  Cardiovascular:  No dyspnea on exertion, edema, orthopnea, palpitations, paroxysmal nocturnal dyspnea. Positive for chest pain. Dermatological: No rash, lesions/masses Respiratory: No cough, dyspnea Urologic: No hematuria, dysuria Abdominal:   No nausea, vomiting, diarrhea, bright red blood per rectum, melena, or hematemesis Neurologic:  No visual changes, wkns. Positive for changes in mental status and headache. All other systems reviewed and are otherwise negative except as noted above.  Physical Exam    Blood pressure 148/69, pulse 92, temperature 98.9 F (37.2 C), temperature source Oral, resp. rate 18, height 5\' 2"  (1.575 m), weight 107 lb (48.535 kg), SpO2 96 %.  General: Pleasant, elderly Caucasian female appearing in NAD. Psych: Normal affect. Neuro: Alert and oriented X 3. Moves all extremities spontaneously. HEENT: Normal  Neck: Supple without bruits or JVD. Lungs:  Resp regular and unlabored, CTA without wheezing or rales. Heart: RRR no s3, s4, or murmurs. Abdomen: Soft, non-tender, non-distended, BS + x 4.  Extremities: No clubbing, cyanosis or edema. DP/PT/Radials 2+ and equal bilaterally.  Labs    Troponin (Point of Care Test) No results for input(s): TROPIPOC in the last 72 hours.  Recent Labs  09/11/15 0007 09/11/15 0956 09/11/15 1447 09/11/15 2057  TROPONINI 0.05* 0.06* 0.08* 0.09*   Lab Results  Component Value Date   WBC 15.1* 09/12/2015   HGB 13.1 09/12/2015   HCT 38.0 09/12/2015   MCV 94.4 09/12/2015   PLT 266 09/12/2015    Recent Labs Lab  09/11/15 0007  09/12/15 0454  NA 132*  < > 136  K 3.4*  < > 3.8  CL 102  < > 109  CO2 19*  < > 19*  BUN 16  < > 25*  CREATININE 0.66  < > 0.79  CALCIUM 9.1  < > 8.5*  PROT 7.2  --   --   BILITOT 0.8  --   --   ALKPHOS 65  --   --   ALT 33  --   --   AST 31  --   --   GLUCOSE 108*  < > 105*  < > = values in this interval not displayed. Lab Results  Component Value Date   CHOL 229* 09/13/2012   HDL 38* 09/13/2012   LDLCALC 157* 09/13/2012   TRIG 168* 09/13/2012   No results found for: [facility]   Radiology Studies    Dg Chest 2 View: 09/11/2015  CLINICAL DATA:  Initial evaluation for acute chest pain. EXAM: CHEST  2 VIEW COMPARISON:  Prior study from 09/12/2012. FINDINGS: Cardiac and mediastinal silhouettes are stable in size and contour, and remain within normal limits. Tortuosity the intrathoracic aorta noted. Lungs well inflated. Mild chronic coarsening of the interstitial markings. No focal infiltrates. No pulmonary edema or pleural effusion. No pneumothorax. Diffuse osteopenia present. Scoliosis noted. No acute osseous abnormality. IMPRESSION: Chronic coarsening of the interstitial markings. No active cardiopulmonary disease identified. Electronically Signed   By: Jeannine Boga M.D.   On: 09/11/2015 01:08   Ct Head Wo Contrast: 09/11/2015  CLINICAL DATA:  Initial evaluation for acute headache. EXAM: CT HEAD WITHOUT CONTRAST TECHNIQUE: Contiguous axial images were obtained from the base of the skull through the vertex without intravenous contrast. COMPARISON:  Prior study from 09/13/2012. FINDINGS: Diffuse prominence of the CSF containing spaces compatible with generalized cerebral atrophy. Mild chronic small vessel ischemic  disease within the periventricular white matter. Scattered vascular calcifications within the carotid siphons. No acute large vessel territory infarct. No acute intracranial hemorrhage. No mass lesion, midline shift or mass effect. No hydrocephalus. No  extra-axial fluid collection. Scalp soft tissues within normal limits. No acute abnormality about the orbits. Paranasal sinuses are clear.  No mastoid effusion. Calvarium intact. IMPRESSION: 1. No acute intracranial process. 2. Mild age-related cerebral atrophy with chronic small vessel ischemic disease. Electronically Signed   By: Jeannine Boga M.D.   On: 09/11/2015 01:40    EKG & Cardiac Imaging    EKG: Sinus tachycardia, HR 102, with no acute ST or T-wave changes.  Echocardiogram: 09/11/2015 Study Conclusions - Left ventricle: The cavity size was normal. There was moderate  concentric hypertrophy. Systolic function was normal. The  estimated ejection fraction was in the range of 60% to 65%. Wall  motion was normal; there were no regional wall motion  abnormalities. Doppler parameters are consistent with abnormal  left ventricular relaxation (grade 1 diastolic dysfunction). - Left atrium: The atrium was normal in size. - Right ventricle: Systolic function was normal. - Pulmonary arteries: Systolic pressure was within the normal  range.  Assessment & Plan    1. Elevated Troponin/ Chest Pain - presented with chest discomfort and a headache in the setting of accelerated HTN. She denies any recent chest pain or dyspnea with exertion prior to that episode. Denies any anginal symptoms at this time. - Cyclic troponin values have been 0.05, 0.06, 0.08, and 0.09. EKG shows no acute ischemic changes. - repeat echocardiogram has been obtained which showed a preserved EF of 60-65% with Grade 1 DD. Last ischemic evaluation was in 04/2014 and showed no evidence of ischemia or infarction. - her troponin values are likely secondary to demand ischemia in the setting of her accelerated HTN. Her CP has now resolved. Would not pursue further ischemic evaluation at this time. The patient and her daughter are in agreement with this plan.  2. Accelerated HTN - SBP elevated into the 200's on  admission. Associated headache and chest discomfort present as well. Required IV Hydralazine yesterday due to being unable to take PO medications - BP improved to 148/69 on recent check. - would continue PO Hydralazine 25mg  Q6H and Digoxin (Dig level checked and at 0.4 this admission). The patient refuses to take her medication without food, so she will need to take with a meal or snack while admitted. - will see how her BP responds. May need to further titrate her dose. PTA, she had been instructed to take an extra Hydralazine daily if her SBP > 170.   3. Leukocytosis - WBC at 18.8 on admission. Improved to 15.1 on 09/12/2015. - Infectious Disease recommended obtaining MRI of the brain to evaluate for encephalitis. - per admitting team  4. Altered Mental Status - CT Head without acute abnormalities. MRI pending. - improved as of this morning. - per admitting team. Neurology following.   Signed, Erma Heritage, PA-C 09/12/2015, 7:51 AM Pager: [phone]   Attending Note  Patient was having MRI on rounds Was not evaluated Discharged before she was able to be examined  I did talk with the family at the bedside while patient was at MRI,  We will arrange outpatient office follow-up  Signed: Esmond Plants  M.D., Ph.D. [facility]

## 2015-09-12 NOTE — Evaluation (Signed)
Physical Therapy Evaluation Patient Details Name: [name] MRN: [id number] DOB: 1921/03/16 Today's Date: 09/12/2015   History of Present Illness  Pt is a 82 y.o. female presenting to hospital with AMS, chest pain, and L hand and feet paresthesias.  Pt admitted with acute delirium and hypertensive urgency and shaking episodes.  CT of head and MRI of brain negative.  PMH includes htn, spinal stenosis, positional vertigo, atypical chest pain, mitral valve prolapse, B carpal tunnel release, lumbar lami.  Clinical Impression  Prior to admission, pt was independent ambulating with walker household distances.  Pt lives alone with her dog.  Currently pt is independent with bed mobility and CGA with transfers and ambulation 60 feet with RW.  No loss of balance noted during session and pt demonstrates good balance with standing balance reaching activities.  Pt would benefit from skilled PT to address noted impairments and functional limitations.  Recommend pt discharge to home (see follow-up recommendations below for more detailed recommendations) when medically appropriate.  Pt's son reports pt will have 24/7 assist this weekend and then assist at nights after that with intermittent assist during the day. Pt refusing HHPT.     Follow Up Recommendations Home health PT (pt refusing HHPT; recommend initial 24/7 assist first couple days then intermittent assist during days and assist at night for functional mobility (pt reports needing to toilet multiple times at night))    Equipment Recommendations       Recommendations for Other Services       Precautions / Restrictions Precautions Precautions: Fall Restrictions Weight Bearing Restrictions: No      Mobility  Bed Mobility Overal bed mobility: Independent             General bed mobility comments: Supine to/from sit  Transfers Overall transfer level: Needs assistance Equipment used: Rolling walker (2 wheeled) Transfers: Sit  to/from Stand Sit to Stand: Min guard         General transfer comment: steady without loss of balance; fairly good strong stand  Ambulation/Gait Ambulation/Gait assistance: Min guard Ambulation Distance (Feet): 60 Feet Assistive device: Rolling walker (2 wheeled) Gait Pattern/deviations: Step-through pattern Gait velocity: decreased   General Gait Details: steady without loss of balance  Stairs            Wheelchair Mobility    Modified Rankin (Stroke Patients Only)       Balance Overall balance assessment: History of Falls (Static standing balance:  feet apart eyes open and closed SBA x10 seconds each without loss of balance; standing reaching within and outside BOS without loss of balance)                                           Pertinent Vitals/Pain Pain Assessment: No/denies pain  Vitals stable and WFL throughout treatment session.    Home Living Family/patient expects to be discharged to:: Private residence Living Arrangements: Alone Available Help at Discharge: Family (Aides) Type of Home: House Home Access: [place]: One [place]: Bedside commode;Walker - 4 wheels      Prior Function Level of Independence: Needs assistance;Independent with assistive device(s)      ADL's / Homemaking Assistance Needed: Has aides daily M-F for 2 hours per day.    Comments: Pt reports no recent falls but did have some falls in past 6 months in kitchen  per pt.  Lives with dog "[name]".  Pt's daughter describes some "furniture cruising" ambulation within home.     Hand Dominance   Dominant Hand: Right    Extremity/Trunk Assessment   Upper Extremity Assessment: Generalized weakness           Lower Extremity Assessment: Generalized weakness         Communication   Communication: No difficulties  Cognition Arousal/Alertness: Awake/alert Behavior During Therapy: WFL for tasks  assessed/performed Overall Cognitive Status: Within Functional Limits for tasks assessed                      General Comments General comments (skin integrity, edema, etc.): Pt laying in bed with her son present.  Nursing cleared pt for participation in physical therapy.  Pt agreeable to PT session.    Exercises        Assessment/Plan    PT Assessment Patient needs continued PT services  PT Diagnosis Generalized weakness   PT Problem List Decreased strength;Decreased activity tolerance  PT Treatment Interventions DME instruction;Gait training;Functional mobility training;Therapeutic activities;Therapeutic exercise;Balance training;Patient/family education   PT Goals (Current goals can be found in the Care Plan section) Acute Rehab PT Goals Patient Stated Goal: to go home PT Goal Formulation: With patient Time For Goal Achievement: 09/26/15 Potential to Achieve Goals: Good    Frequency Min 2X/week   Barriers to discharge        Co-evaluation               End of Session Equipment Utilized During Treatment: Gait belt Activity Tolerance: Patient tolerated treatment well Patient left: in bed;with call bell/phone within reach;with bed alarm set;with family/visitor present Nurse Communication: Mobility status         Time: 1350-1415 PT Time Calculation (min) (ACUTE ONLY): 25 min   Charges:   PT Evaluation $PT Eval Low Complexity: 1 Procedure     PT G CodesLeitha Bleak 10-04-15, 4:00 PM Leitha Bleak, [place]

## 2015-09-12 NOTE — Discharge Instructions (Signed)
Heart healthy diet. °Activity as tolerated. °Fall precaution. °

## 2015-09-12 NOTE — Care Management (Addendum)
Physical therapy evaluation completed. Spoke with daughter, Clarise Cruz and son Jenny Reichmann at the bedside. States they have a 24/7 scheduled worked out with family for the next few days. Requested no home health at this time. Mother has a rollator to aid in ambulation.  Discharge to home today per Dr. Haydee Monica RN MSN CCM Care Management [phone]

## 2015-09-12 NOTE — Progress Notes (Signed)
MD paged to question whether patient needs antibiotic for possible urinary tract infection prior to discharge - MD denied. Patient and daughter aware. Madlyn Frankel, RN

## 2015-09-12 NOTE — Discharge Summary (Signed)
[facility] [place] NAME: [name]    MR#:  [id number]  DATE OF BIRTH:  [birth year]  DATE OF ADMISSION:  09/10/2015 ADMITTING PHYSICIAN: Saundra Shelling, MD  DATE OF DISCHARGE: 09/12/2015  4:01 PM  PRIMARY CARE PHYSICIAN: Dicky Doe, MD    ADMISSION DIAGNOSIS:  Elevated troponin [R79.89] Essential hypertension [I10] Chest pain, unspecified chest pain type [R07.9]   DISCHARGE DIAGNOSIS:  Altered mental status, unclear etiology, possible secondary to behavior problem.  Accelerated hypertension  Atypical chest pain with elevated troponin SECONDARY DIAGNOSIS:   Past Medical History  Diagnosis Date  . Hypertension     a. labile HTN  . Atypical chest pain     a. nuclear stress test 04/2010: normal; b. echo 2009: EF 55-60%, mild LVH, impaired LV relaxation, mild aortic sclerosis, mild TR, nl PASP, since echo 2007 no significant change   . History of spinal stenosis     underwent back surgery on 12/03/09 by Dr. Glenna Fellows  . Hyperlipidemia   . Vertigo     positional vertigo  . Dizziness   . Muscle spasm   . Numbness in feet   . Dyspepsia   . Fatigue   . Headaches, cluster   . Joint pain   . Worsening vision   . Shortness of breath   . Heart palpitations   . Nausea and/or vomiting     with walking  . Hypercholesterolemia     with inability to tolerate statin therapy  . Mitral valve prolapse   . History of liver disease     history of liver trouble, has not had any known esposures to jaundiced person  . History of rheumatic fever     as a child  . 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 Medication side effects     a. 30+ medication allergies and/or intolerances; b. she will not take daily antihypertensives    HOSPITAL COURSE:  82 year old female patient with history of for hypertension, hyperlipidemia, dyspepsia presented to the emergency room with confusion and elevated blood pressure  1.  Altered mental status, unclear etiology, possible secondary to behavior problem. I requested psych consult and discussed with Dr. Gretel Acre. Per neurologist, Dr. Doy Mince, Findings on examination at this time are more behavioral than anything else. Although CNS is a possibility the patient has refused an LP. Per on call ID physician (requested by pt's daughter), MRI brain with contrast, which is negative. The patient's mental status improved to her baseline this morning. She has no complaints and wanted to go home.  2. Accelerated hypertension. Improved with hydralazine by mouth and IV when necessary.  4. Leukocytosis. Possible due to reaction. Better. The patient has a history of chronic leukocytosis.  5. Atypical chest pain with elevated troponin. EKG shows no acute ischemic changes. likely secondary to demand ischemia in the setting of her accelerated HTN.  She was treated with Plavix. Her CP has now resolved. Would not pursue further ischemic evaluation at this time per Dr. Rockey Situ. The patient and her daughter are in agreement with this plan.  6. Hypokalemia, improved with potassium supplement. 7. Hyponatremia. Treated wtih normal saline IV and a follow-up BMP as outpatient.   DISCHARGE CONDITIONS:   Stable, discharge to home with advanced home health [facility].  CONSULTS OBTAINED:  Treatment Team:  Alexis Goodell, MD Rainey Pines, MD Minna Merritts, MD  DRUG ALLERGIES:   Allergies  Allergen Reactions  . Amlodipine Nausea  Only  . Aspirin   . Bystolic [Nebivolol Hcl]     Vision problem  . Calan [Verapamil Hcl]     Weakness and staggering  . Cardura [Doxazosin Mesylate]   . Cephalosporins   . Clonidine Derivatives     Facial swelling   . Codeine   . Cozaar   . Doxazosin   . Elavil [Amitriptyline Hcl]   . Erythromycin   . Gantrisin [Sulfisoxazole]   . Hctz [Hydrochlorothiazide]   . Iodides   . Lopressor [Metoprolol Tartrate]   . Methyldopa     Facial swelling   .  Micardis [Telmisartan]   . Morphine And Related   . Nizatidine   . Nsaids   . Pantoprazole Sodium   . Penicillins   . Prilosec [Omeprazole]   . Promethazine Hcl   . Streptomycin   . Sulfa Drugs Cross Reactors   . Tequin   . Tetracyclines & Related   . Toprol Xl [Metoprolol Succinate]   . Ultram [Tramadol]   . Valium   . Valturna [Aliskiren-Valsartan] Nausea And Vomiting    DISCHARGE MEDICATIONS:   Discharge Medication List as of 09/12/2015 11:22 AM    CONTINUE these medications which have NOT CHANGED   Details  !! digoxin (LANOXIN) 0.125 MG tablet Take 0.0625 mg by mouth See admin instructions. On Monday, Wednesday, and Friday, Until Discontinued, Historical Med    !! digoxin (LANOXIN) 0.125 MG tablet Take 0.125 mg by mouth See admin instructions. On Tuesday, Thursday, Saturday, and Sunday, Until Discontinued, Historical Med    dimenhyDRINATE (DRAMAMINE) 50 MG tablet Take 50 mg by mouth every 8 (eight) hours as needed (for nausea)., Until Discontinued, Historical Med    fluticasone (FLONASE) 50 MCG/ACT nasal spray Place 1-2 sprays into both nostrils daily as needed for allergies or rhinitis. , Until Discontinued, Historical Med    hydrALAZINE (APRESOLINE) 25 MG tablet Take 1 tablet (25 mg total) by mouth 4 (four) times daily., Starting 06/03/2015, Until Discontinued, Normal    nitroGLYCERIN (NITROSTAT) 0.4 MG SL tablet Place 0.4 mg under the tongue every 5 (five) minutes as needed for chest pain., Until Discontinued, Historical Med    Polyethyl Glycol-Propyl Glycol (SYSTANE ULTRA PF) 0.4-0.3 % SOLN Place 1 drop into both eyes 2 (two) times daily., Until Discontinued, Historical Med    Simethicone (GAS-X EXTRA STRENGTH) 125 MG CAPS Take 125 mg by mouth as needed (for gas relief)., Until Discontinued, Historical Med    sodium chloride (OCEAN) 0.65 % nasal spray Place 1 spray into the nose as needed for congestion., Until Discontinued, Historical Med    triamcinolone (NASACORT  ALLERGY 24HR) 55 MCG/ACT AERO nasal inhaler Place 2 sprays into the nose daily as needed (for allergies). , Until Discontinued, Historical Med     !! - Potential duplicate medications found. Please discuss with provider.       DISCHARGE INSTRUCTIONS:    If you experience worsening of your admission symptoms, develop shortness of breath, life threatening emergency, suicidal or homicidal thoughts you must seek medical attention immediately by calling 911 or calling your MD immediately  if symptoms less severe.  You Must read complete instructions/literature along with all the possible adverse reactions/side effects for all the Medicines you take and that have been prescribed to you. Take any new Medicines after you have completely understood and accept all the possible adverse reactions/side effects.   Please note  You were cared for by a hospitalist during your hospital stay. If you have any questions about  your discharge medications or the care you received while you were in the hospital after you are discharged, you can call the unit and asked to speak with the hospitalist on call if the hospitalist that took care of you is not available. Once you are discharged, your primary care physician will handle any further medical issues. Please note that NO REFILLS for any discharge medications will be authorized once you are discharged, as it is imperative that you return to your primary care physician (or establish a relationship with a primary care physician if you do not have one) for your aftercare needs so that they can reassess your need for medications and monitor your lab values.    Today   SUBJECTIVE   No complaint.   VITAL SIGNS:  Blood pressure 147/66, pulse 84, temperature 98.3 F (36.8 C), temperature source Oral, resp. rate 20, height 5\' 2"  (1.575 m), weight 48.535 kg (107 lb), SpO2 98 %.  I/O:   Intake/Output Summary (Last 24 hours) at 09/12/15 1807 Last data filed at  09/12/15 1328  Gross per 24 hour  Intake 1138.33 ml  Output    860 ml  Net 278.33 ml    PHYSICAL EXAMINATION:  GENERAL:  82 y.o.-year-old patient lying in the bed with no acute distress.  EYES: Pupils equal, round, reactive to light and accommodation. No scleral icterus. Extraocular muscles intact.  HEENT: Head atraumatic, normocephalic. Oropharynx and nasopharynx clear.  NECK:  Supple, no jugular venous distention. No thyroid enlargement, no tenderness.  LUNGS: Normal breath sounds bilaterally, no wheezing, rales,rhonchi or crepitation. No use of accessory muscles of respiration.  CARDIOVASCULAR: S1, S2 normal. No murmurs, rubs, or gallops.  ABDOMEN: Soft, non-tender, non-distended. Bowel sounds present. No organomegaly or mass.  EXTREMITIES: No pedal edema, cyanosis, or clubbing.  NEUROLOGIC: Cranial nerves II through XII are intact. Muscle strength 5/5 in all extremities. Sensation intact. Gait not checked.  PSYCHIATRIC: The patient is alert and oriented x 3.  SKIN: No obvious rash, lesion, or ulcer.   DATA REVIEW:   CBC  Recent Labs Lab 09/12/15 0454  WBC 15.1*  HGB 13.1  HCT 38.0  PLT 266    Chemistries   Recent Labs Lab 09/11/15 0007  09/12/15 0454  NA 132*  < > 136  K 3.4*  < > 3.8  CL 102  < > 109  CO2 19*  < > 19*  GLUCOSE 108*  < > 105*  BUN 16  < > 25*  CREATININE 0.66  < > 0.79  CALCIUM 9.1  < > 8.5*  AST 31  --   --   ALT 33  --   --   ALKPHOS 65  --   --   BILITOT 0.8  --   --   < > = values in this interval not displayed.  Cardiac Enzymes  Recent Labs Lab 09/11/15 2057  TROPONINI 0.09*    Microbiology Results  Results for orders placed or performed during the hospital encounter of 09/10/15  Culture, blood (Routine X 2) w Reflex to ID Panel     Status: None (Preliminary result)   Collection Time: 09/11/15  5:57 PM  Result Value Ref Range Status   Specimen Description BLOOD RIGHT ASSIST CONTROL  Final   Special Requests BOTTLES DRAWN  AEROBIC AND ANAEROBIC 10CC  Final   Culture NO GROWTH < 12 HOURS  Final   Report Status PENDING  Incomplete  Culture, blood (Routine X 2) w Reflex to ID Panel  Status: None (Preliminary result)   Collection Time: 09/11/15  6:05 PM  Result Value Ref Range Status   Specimen Description BLOOD LEFT HAND  Final   Special Requests BOTTLES DRAWN AEROBIC AND ANAEROBIC [place]  Final   Culture NO GROWTH < 12 HOURS  Final   Report Status PENDING  Incomplete    RADIOLOGY:  Dg Chest 2 View  09/11/2015  CLINICAL DATA:  Initial evaluation for acute chest pain. EXAM: CHEST  2 VIEW COMPARISON:  Prior study from 09/12/2012. FINDINGS: Cardiac and mediastinal silhouettes are stable in size and contour, and remain within normal limits. Tortuosity the intrathoracic aorta noted. Lungs well inflated. Mild chronic coarsening of the interstitial markings. No focal infiltrates. No pulmonary edema or pleural effusion. No pneumothorax. Diffuse osteopenia present. Scoliosis noted. No acute osseous abnormality. IMPRESSION: Chronic coarsening of the interstitial markings. No active cardiopulmonary disease identified. Electronically Signed   By: Jeannine Boga M.D.   On: 09/11/2015 01:08   Ct Head Wo Contrast  09/11/2015  CLINICAL DATA:  Initial evaluation for acute headache. EXAM: CT HEAD WITHOUT CONTRAST TECHNIQUE: Contiguous axial images were obtained from the base of the skull through the vertex without intravenous contrast. COMPARISON:  Prior study from 09/13/2012. FINDINGS: Diffuse prominence of the CSF containing spaces compatible with generalized cerebral atrophy. Mild chronic small vessel ischemic disease within the periventricular white matter. Scattered vascular calcifications within the carotid siphons. No acute large vessel territory infarct. No acute intracranial hemorrhage. No mass lesion, midline shift or mass effect. No hydrocephalus. No extra-axial fluid collection. Scalp soft tissues within normal limits.  No acute abnormality about the orbits. Paranasal sinuses are clear.  No mastoid effusion. Calvarium intact. IMPRESSION: 1. No acute intracranial process. 2. Mild age-related cerebral atrophy with chronic small vessel ischemic disease. Electronically Signed   By: Jeannine Boga M.D.   On: 09/11/2015 01:40   Mr Jeri Cos [id number] Contrast  09/12/2015  CLINICAL DATA:  Altered mental status and confusion. EXAM: MRI HEAD WITHOUT AND WITH CONTRAST TECHNIQUE: Multiplanar, multiecho pulse sequences of the brain and surrounding structures were obtained without and with intravenous contrast. CONTRAST:  37mL MULTIHANCE GADOBENATE DIMEGLUMINE 529 MG/ML IV SOLN COMPARISON:  09/13/2012 FINDINGS: Calvarium and upper cervical spine: No focal marrow signal abnormality. Cervical disc and facet degeneration where visualized with chronic C2-3 anterolisthesis. Orbits: Bilateral cataract resection. Sinuses and Mastoids: Clear. Brain: No acute or remote infarct, hemorrhage, hydrocephalus, or mass lesion. No evidence of large vessel occlusion. No abnormal intracranial enhancement. There is generalized cerebral volume loss and scattered small vessel ischemic gliosis which is age congruent. IMPRESSION: No acute finding or change from 2014. Unremarkable brain MRI for age. Electronically Signed   By: Monte Fantasia M.D.   On: 09/12/2015 10:40        Management plans discussed with the patient, her son and daughter and they are in agreement.  CODE STATUS:     Code Status Orders        Start     Ordered   09/11/15 0315  Do not attempt resuscitation (DNR)   Continuous    Question Answer Comment  In the event of cardiac or respiratory ARREST Do not call a "code blue"   In the event of cardiac or respiratory ARREST Do not perform Intubation, CPR, defibrillation or ACLS   In the event of cardiac or respiratory ARREST Use medication by any route, position, wound care, and other measures to relive pain and suffering. May use  oxygen, suction and manual  treatment of airway obstruction as needed for comfort.      09/11/15 0314    Code Status History    Date Active Date Inactive Code Status Order ID Comments User Context   09/12/2012 11:51 PM 09/15/2012  8:07 PM Full Code [id number]  Rise Patience, MD Inpatient    Advance Directive Documentation        Most Recent Value   Type of Advance Directive  Healthcare Power of [place], Living will   Pre-existing out of facility DNR order (yellow form or pink MOST form)     "MOST" Form in Place?        TOTAL TIME TAKING CARE OF THIS PATIENT: 38 minutes.    Demetrios Loll M.D on 09/12/2015 at 6:07 PM  Between 7am to 6pm - Pager - [phone]  After 6pm go to [email or web address] - password [email or web address] [facility]  Office  [phone]  CC: Primary care physician; Dicky Doe, MD

## 2015-09-12 NOTE — Care Management (Signed)
Admitted to [facility] with the diagnosis of altered mental status. Lives with dog, Sonia Baller. Daughter is Hubert Azure [phone] or [phone]). Last seen Dr. Luan Pulling 6 months ago next appointment April 18th. Home Care Providers comes for 2 hours a day Monday-Friday x 3 years. No skilled facility. Home Health in the past. Possibly [facility]. No Home oxygen. [employer] is in the home. Uses a rolling walker to aid in ambulation. [place] in the last few months. Appetite comes and goes. Self Feed. Home Care Providers help with bath and dressing. Family will transport. Shelbie Ammons RN MSN CCM Care Management [phone]

## 2015-09-12 NOTE — Care Management Important Message (Signed)
Important Message  Patient Details  Name: [name] MRN: [id number] Date of Birth: [birth year]   Medicare Important Message Given:  Yes    Shelbie Ammons, RN 09/12/2015, 10:43 AM

## 2015-09-12 NOTE — Progress Notes (Signed)
        Date: 09/12/2015  Patient name: [name]  Medical record number: [id number]  Date of birth: 1921-04-10    MRI of brain reviewed and it is wiithout ANY abnromalities  Therefore there is no way this patient has HSV encephalitis or brain abscess.  Even bacterial or aseptic (non HSV 1) meningitis are unlikely in abscence of fevers, headache an with no leptomeningeal enhancement on MRI  Her ESR was completely normal  HIV antibody and RPR are pending  Blood cultures without growth  She does have some pyuria but absent symptoms this would not require treatment  My pager is [place] 09/12/2015, 2:07 PM

## 2015-09-12 NOTE — Progress Notes (Signed)
Discharge instructions given and went over with patient and daughter at bedside. All questions answered. Patient discharged home with daughter via wheelchair by volunteer services. Madlyn Frankel, RN

## 2015-09-13 LAB — HIV ANTIBODY (ROUTINE TESTING W REFLEX): HIV SCREEN 4TH GENERATION: NONREACTIVE

## 2015-09-13 LAB — RPR: RPR Ser Ql: NONREACTIVE

## 2015-09-14 ENCOUNTER — Inpatient Hospital Stay
Admission: EM | Admit: 2015-09-14 | Discharge: 2015-09-18 | DRG: 871 | Disposition: A | Discharge status: Skilled Nursing Facility | Payer: Medicare Other | Attending: Internal Medicine | Admitting: Internal Medicine

## 2015-09-14 ENCOUNTER — Encounter: Payer: Self-pay | Admitting: Emergency Medicine

## 2015-09-14 DIAGNOSIS — B9620 Unspecified Escherichia coli [E. coli] as the cause of diseases classified elsewhere: Secondary | ICD-10-CM | POA: Diagnosis present

## 2015-09-14 DIAGNOSIS — R4182 Altered mental status, unspecified: Secondary | ICD-10-CM | POA: Diagnosis present

## 2015-09-14 DIAGNOSIS — A419 Sepsis, unspecified organism: Principal | ICD-10-CM | POA: Diagnosis present

## 2015-09-14 DIAGNOSIS — E876 Hypokalemia: Secondary | ICD-10-CM | POA: Diagnosis present

## 2015-09-14 DIAGNOSIS — I10 Essential (primary) hypertension: Secondary | ICD-10-CM | POA: Diagnosis present

## 2015-09-14 DIAGNOSIS — G9340 Encephalopathy, unspecified: Secondary | ICD-10-CM | POA: Diagnosis not present

## 2015-09-14 DIAGNOSIS — Z880 Allergy status to penicillin: Secondary | ICD-10-CM

## 2015-09-14 DIAGNOSIS — R1310 Dysphagia, unspecified: Secondary | ICD-10-CM | POA: Diagnosis not present

## 2015-09-14 DIAGNOSIS — F419 Anxiety disorder, unspecified: Secondary | ICD-10-CM | POA: Diagnosis present

## 2015-09-14 DIAGNOSIS — Z9049 Acquired absence of other specified parts of digestive tract: Secondary | ICD-10-CM

## 2015-09-14 DIAGNOSIS — N3000 Acute cystitis without hematuria: Secondary | ICD-10-CM

## 2015-09-14 DIAGNOSIS — E871 Hypo-osmolality and hyponatremia: Secondary | ICD-10-CM | POA: Diagnosis present

## 2015-09-14 DIAGNOSIS — Z79899 Other long term (current) drug therapy: Secondary | ICD-10-CM

## 2015-09-14 DIAGNOSIS — G8929 Other chronic pain: Secondary | ICD-10-CM | POA: Diagnosis present

## 2015-09-14 DIAGNOSIS — N390 Urinary tract infection, site not specified: Secondary | ICD-10-CM | POA: Diagnosis present

## 2015-09-14 DIAGNOSIS — Z90710 Acquired absence of both cervix and uterus: Secondary | ICD-10-CM

## 2015-09-14 DIAGNOSIS — F329 Major depressive disorder, single episode, unspecified: Secondary | ICD-10-CM | POA: Diagnosis present

## 2015-09-14 DIAGNOSIS — Z8249 Family history of ischemic heart disease and other diseases of the circulatory system: Secondary | ICD-10-CM

## 2015-09-14 DIAGNOSIS — Z66 Do not resuscitate: Secondary | ICD-10-CM | POA: Diagnosis present

## 2015-09-14 DIAGNOSIS — Z9889 Other specified postprocedural states: Secondary | ICD-10-CM

## 2015-09-14 DIAGNOSIS — Z888 Allergy status to other drugs, medicaments and biological substances status: Secondary | ICD-10-CM

## 2015-09-14 DIAGNOSIS — F32A Depression, unspecified: Secondary | ICD-10-CM

## 2015-09-14 HISTORY — DX: Hypertensive heart disease without heart failure: I11.9

## 2015-09-14 LAB — URINE DRUG SCREEN, QUALITATIVE (ARMC ONLY)
Amphetamines, Ur Screen: NOT DETECTED
Barbiturates, Ur Screen: NOT DETECTED
Benzodiazepine, Ur Scrn: NOT DETECTED
CANNABINOID 50 NG, UR ~~LOC~~: NOT DETECTED
COCAINE METABOLITE, UR ~~LOC~~: NOT DETECTED
MDMA (ECSTASY) UR SCREEN: NOT DETECTED
Methadone Scn, Ur: NOT DETECTED
Opiate, Ur Screen: NOT DETECTED
Phencyclidine (PCP) Ur S: NOT DETECTED
TRICYCLIC, UR SCREEN: NOT DETECTED

## 2015-09-14 LAB — URINALYSIS COMPLETE WITH MICROSCOPIC (ARMC ONLY)
BILIRUBIN URINE: NEGATIVE
Glucose, UA: NEGATIVE mg/dL
HGB URINE DIPSTICK: NEGATIVE
KETONES UR: NEGATIVE mg/dL
Nitrite: POSITIVE — AB
PH: 6 (ref 5.0–8.0)
Protein, ur: NEGATIVE mg/dL
Specific Gravity, Urine: 1.008 (ref 1.005–1.030)
Squamous Epithelial / LPF: NONE SEEN

## 2015-09-14 LAB — CBC
HEMATOCRIT: 42.4 % (ref 35.0–47.0)
HEMOGLOBIN: 14.4 g/dL (ref 12.0–16.0)
MCH: 31.4 pg (ref 26.0–34.0)
MCHC: 34 g/dL (ref 32.0–36.0)
MCV: 92.3 fL (ref 80.0–100.0)
Platelets: 277 10*3/uL (ref 150–440)
RBC: 4.59 MIL/uL (ref 3.80–5.20)
RDW: 12.8 % (ref 11.5–14.5)
WBC: 13.5 10*3/uL — ABNORMAL HIGH (ref 3.6–11.0)

## 2015-09-14 LAB — COMPREHENSIVE METABOLIC PANEL
ALBUMIN: 4.2 g/dL (ref 3.5–5.0)
ALT: 39 U/L (ref 14–54)
AST: 35 U/L (ref 15–41)
Alkaline Phosphatase: 72 U/L (ref 38–126)
Anion gap: 8 (ref 5–15)
BUN: 21 mg/dL — AB (ref 6–20)
CO2: 21 mmol/L — AB (ref 22–32)
CREATININE: 0.62 mg/dL (ref 0.44–1.00)
Calcium: 9 mg/dL (ref 8.9–10.3)
Chloride: 102 mmol/L (ref 101–111)
GFR calc Af Amer: >60 mL/min (ref >60)
GFR calc non Af Amer: >60 mL/min (ref >60)
GLUCOSE: 118 mg/dL — AB (ref 65–99)
Potassium: 2.9 mmol/L — CL (ref 3.5–5.1)
SODIUM: 131 mmol/L — AB (ref 135–145)
Total Bilirubin: 0.9 mg/dL (ref 0.3–1.2)
Total Protein: 6.9 g/dL (ref 6.5–8.1)

## 2015-09-14 LAB — TSH: TSH: 2.619 u[IU]/mL (ref 0.350–4.500)

## 2015-09-14 LAB — ETHANOL: Alcohol, Ethyl (B): <5 mg/dL (ref <5)

## 2015-09-14 LAB — ACETAMINOPHEN LEVEL: Acetaminophen (Tylenol), Serum: <10 ug/mL — ABNORMAL LOW (ref 10–30)

## 2015-09-14 LAB — DIGOXIN LEVEL: Digoxin Level: 0.5 ng/mL — ABNORMAL LOW (ref 0.8–2.0)

## 2015-09-14 LAB — SALICYLATE LEVEL

## 2015-09-14 MED ORDER — HALOPERIDOL LACTATE 5 MG/ML IJ SOLN
1.0000 mg | Freq: Once | INTRAMUSCULAR | Status: AC
  Administered 2015-09-15: 1 mg via INTRAMUSCULAR
  Filled 2015-09-14: qty 1

## 2015-09-14 MED ORDER — LEVOFLOXACIN IN D5W 750 MG/150ML IV SOLN
750.0000 mg | Freq: Once | INTRAVENOUS | Status: AC
  Administered 2015-09-15: 750 mg via INTRAVENOUS
  Filled 2015-09-14: qty 150

## 2015-09-14 MED ORDER — SODIUM CHLORIDE 0.9 % IV BOLUS (SEPSIS)
500.0000 mL | Freq: Once | INTRAVENOUS | Status: AC
  Administered 2015-09-15: 500 mL via INTRAVENOUS

## 2015-09-14 NOTE — ED Notes (Signed)
Pt presents w/ altered mental status. Pt states she is unable to speak except through her "interpreter" who is a family friend. Pt is able to speak and is able to speak clearly and answer questions w/o utilizing this interpreter/family friend. Pt's daughter said it took her 3 hrs last night to arrange her bedroom before pt was able to go to bed. Pt has exhibited echolalia. Pt was described as spelling all of the words that she wanted to say, instead of speaking the entire words. Pt has said she cannot speak and must have her lips read by the family friend/interpreter. Pt c/o chronic neck pain and head pain. Provided w/ warm blanket which pt refused after it was provided. Urine described as dark and cloudy w/ had strong odor.

## 2015-09-14 NOTE — ED Notes (Signed)
Patient was recently discharged here on Friday for altered mental status. Patient had MRI that was negative. Patient's confusion had improved when she was discharged on Friday. Patient's confusion started returning last night. Patient in triage stating that she can not speak and must use family friend as interpreter. Family reporting unusual behavior.

## 2015-09-14 NOTE — ED Notes (Signed)
MD at bedside. Crit K+ 2.9 reported

## 2015-09-14 NOTE — ED Notes (Signed)
Family at bedside. 

## 2015-09-15 ENCOUNTER — Encounter: Payer: Self-pay | Admitting: Internal Medicine

## 2015-09-15 DIAGNOSIS — F419 Anxiety disorder, unspecified: Secondary | ICD-10-CM | POA: Diagnosis present

## 2015-09-15 DIAGNOSIS — G9340 Encephalopathy, unspecified: Secondary | ICD-10-CM | POA: Diagnosis present

## 2015-09-15 DIAGNOSIS — R1310 Dysphagia, unspecified: Secondary | ICD-10-CM | POA: Diagnosis present

## 2015-09-15 DIAGNOSIS — F329 Major depressive disorder, single episode, unspecified: Secondary | ICD-10-CM

## 2015-09-15 DIAGNOSIS — A419 Sepsis, unspecified organism: Secondary | ICD-10-CM | POA: Diagnosis present

## 2015-09-15 DIAGNOSIS — M6281 Muscle weakness (generalized): Secondary | ICD-10-CM | POA: Diagnosis not present

## 2015-09-15 DIAGNOSIS — Z9049 Acquired absence of other specified parts of digestive tract: Secondary | ICD-10-CM | POA: Diagnosis not present

## 2015-09-15 DIAGNOSIS — Z880 Allergy status to penicillin: Secondary | ICD-10-CM | POA: Diagnosis not present

## 2015-09-15 DIAGNOSIS — R4182 Altered mental status, unspecified: Secondary | ICD-10-CM | POA: Diagnosis not present

## 2015-09-15 DIAGNOSIS — R531 Weakness: Secondary | ICD-10-CM | POA: Diagnosis not present

## 2015-09-15 DIAGNOSIS — Z888 Allergy status to other drugs, medicaments and biological substances status: Secondary | ICD-10-CM | POA: Diagnosis not present

## 2015-09-15 DIAGNOSIS — B9620 Unspecified Escherichia coli [E. coli] as the cause of diseases classified elsewhere: Secondary | ICD-10-CM | POA: Diagnosis present

## 2015-09-15 DIAGNOSIS — N390 Urinary tract infection, site not specified: Secondary | ICD-10-CM | POA: Diagnosis present

## 2015-09-15 DIAGNOSIS — Z66 Do not resuscitate: Secondary | ICD-10-CM | POA: Diagnosis present

## 2015-09-15 DIAGNOSIS — I10 Essential (primary) hypertension: Secondary | ICD-10-CM | POA: Diagnosis not present

## 2015-09-15 DIAGNOSIS — E876 Hypokalemia: Secondary | ICD-10-CM | POA: Diagnosis present

## 2015-09-15 DIAGNOSIS — F32A Depression, unspecified: Secondary | ICD-10-CM

## 2015-09-15 DIAGNOSIS — R2689 Other abnormalities of gait and mobility: Secondary | ICD-10-CM | POA: Diagnosis not present

## 2015-09-15 DIAGNOSIS — E871 Hypo-osmolality and hyponatremia: Secondary | ICD-10-CM | POA: Diagnosis present

## 2015-09-15 DIAGNOSIS — Z9889 Other specified postprocedural states: Secondary | ICD-10-CM | POA: Diagnosis not present

## 2015-09-15 DIAGNOSIS — Z79899 Other long term (current) drug therapy: Secondary | ICD-10-CM | POA: Diagnosis not present

## 2015-09-15 DIAGNOSIS — N3000 Acute cystitis without hematuria: Secondary | ICD-10-CM | POA: Diagnosis not present

## 2015-09-15 DIAGNOSIS — Z8249 Family history of ischemic heart disease and other diseases of the circulatory system: Secondary | ICD-10-CM | POA: Diagnosis not present

## 2015-09-15 DIAGNOSIS — Z90710 Acquired absence of both cervix and uterus: Secondary | ICD-10-CM | POA: Diagnosis not present

## 2015-09-15 DIAGNOSIS — Z7401 Bed confinement status: Secondary | ICD-10-CM | POA: Diagnosis not present

## 2015-09-15 DIAGNOSIS — G8929 Other chronic pain: Secondary | ICD-10-CM | POA: Diagnosis present

## 2015-09-15 LAB — BASIC METABOLIC PANEL
Anion gap: 9 (ref 5–15)
BUN: 20 mg/dL (ref 6–20)
CALCIUM: 8.5 mg/dL — AB (ref 8.9–10.3)
CO2: 17 mmol/L — ABNORMAL LOW (ref 22–32)
Chloride: 104 mmol/L (ref 101–111)
Creatinine, Ser: 0.71 mg/dL (ref 0.44–1.00)
GFR calc Af Amer: >60 mL/min (ref >60)
Glucose, Bld: 109 mg/dL — ABNORMAL HIGH (ref 65–99)
POTASSIUM: 3.8 mmol/L (ref 3.5–5.1)
SODIUM: 130 mmol/L — AB (ref 135–145)

## 2015-09-15 LAB — CBC
HEMATOCRIT: 42.1 % (ref 35.0–47.0)
Hemoglobin: 14.3 g/dL (ref 12.0–16.0)
MCH: 31.7 pg (ref 26.0–34.0)
MCHC: 33.9 g/dL (ref 32.0–36.0)
MCV: 93.7 fL (ref 80.0–100.0)
PLATELETS: 262 10*3/uL (ref 150–440)
RBC: 4.49 MIL/uL (ref 3.80–5.20)
RDW: 12.9 % (ref 11.5–14.5)
WBC: 15 10*3/uL — AB (ref 3.6–11.0)

## 2015-09-15 LAB — GLUCOSE, CAPILLARY: GLUCOSE-CAPILLARY: 96 mg/dL (ref 65–99)

## 2015-09-15 LAB — MAGNESIUM
MAGNESIUM: 1.9 mg/dL (ref 1.7–2.4)
MAGNESIUM: 1.8 mg/dL (ref 1.7–2.4)

## 2015-09-15 MED ORDER — DIGOXIN 125 MCG PO TABS
0.1250 mg | ORAL_TABLET | ORAL | Status: DC
  Administered 2015-09-16 – 2015-09-18 (×2): 0.125 mg via ORAL
  Filled 2015-09-15: qty 1

## 2015-09-15 MED ORDER — DIMENHYDRINATE 50 MG PO TABS
50.0000 mg | ORAL_TABLET | Freq: Three times a day (TID) | ORAL | Status: DC | PRN
  Administered 2015-09-16 – 2015-09-18 (×4): 50 mg via ORAL
  Filled 2015-09-15 (×8): qty 1

## 2015-09-15 MED ORDER — HYDRALAZINE HCL 20 MG/ML IJ SOLN
10.0000 mg | INTRAMUSCULAR | Status: DC | PRN
  Filled 2015-09-15: qty 1

## 2015-09-15 MED ORDER — SALINE SPRAY 0.65 % NA SOLN
1.0000 | NASAL | Status: DC | PRN
  Filled 2015-09-15: qty 44

## 2015-09-15 MED ORDER — SODIUM CHLORIDE 0.9 % IV BOLUS (SEPSIS)
500.0000 mL | Freq: Once | INTRAVENOUS | Status: AC
Start: 2015-09-15 — End: 2015-09-15
  Administered 2015-09-15: 500 mL via INTRAVENOUS

## 2015-09-15 MED ORDER — SODIUM CHLORIDE 0.9 % IV SOLN
INTRAVENOUS | Status: DC
  Administered 2015-09-15: 1000 mL via INTRAVENOUS

## 2015-09-15 MED ORDER — POTASSIUM CHLORIDE 10 MEQ/100ML IV SOLN: 10.0000 meq | INTRAVENOUS | Status: DC

## 2015-09-15 MED ORDER — NITROGLYCERIN 0.4 MG SL SUBL: 0.4000 mg | SUBLINGUAL_TABLET | SUBLINGUAL | Status: DC | PRN

## 2015-09-15 MED ORDER — HEPARIN SODIUM (PORCINE) 5000 UNIT/ML IJ SOLN
5000.0000 [IU] | Freq: Three times a day (TID) | INTRAMUSCULAR | Status: DC
  Administered 2015-09-15 – 2015-09-18 (×11): 5000 [IU] via SUBCUTANEOUS
  Filled 2015-09-15 (×11): qty 1

## 2015-09-15 MED ORDER — DIGOXIN 125 MCG PO TABS
0.1250 mg | ORAL_TABLET | ORAL | Status: DC
  Filled 2015-09-15: qty 1

## 2015-09-15 MED ORDER — SIMETHICONE 80 MG PO CHEW
120.0000 mg | CHEWABLE_TABLET | ORAL | Status: DC | PRN
  Filled 2015-09-15 (×3): qty 2

## 2015-09-15 MED ORDER — LEVOFLOXACIN IN D5W 250 MG/50ML IV SOLN
250.0000 mg | INTRAVENOUS | Status: DC
  Administered 2015-09-15 – 2015-09-16 (×2): 250 mg via INTRAVENOUS
  Filled 2015-09-15 (×3): qty 50

## 2015-09-15 MED ORDER — DIGOXIN 125 MCG PO TABS
0.0625 mg | ORAL_TABLET | ORAL | Status: DC
  Administered 2015-09-15 – 2015-09-17 (×2): 0.0625 mg via ORAL
  Filled 2015-09-15 (×3): qty 1

## 2015-09-15 MED ORDER — HYDRALAZINE HCL 25 MG PO TABS
25.0000 mg | ORAL_TABLET | Freq: Four times a day (QID) | ORAL | Status: DC
  Administered 2015-09-15 – 2015-09-16 (×6): 25 mg via ORAL
  Filled 2015-09-15 (×7): qty 1

## 2015-09-15 MED ORDER — DIGOXIN 125 MCG PO TABS
0.0625 mg | ORAL_TABLET | ORAL | Status: DC
  Filled 2015-09-15: qty 1

## 2015-09-15 MED ORDER — MIRTAZAPINE 15 MG PO TBDP
7.5000 mg | ORAL_TABLET | Freq: Every day | ORAL | Status: DC
  Filled 2015-09-15 (×2): qty 0.5

## 2015-09-15 MED ORDER — POTASSIUM CHLORIDE 10 MEQ/100ML IV SOLN
10.0000 meq | INTRAVENOUS | Status: AC
  Administered 2015-09-15 (×3): 10 meq via INTRAVENOUS
  Filled 2015-09-15 (×3): qty 100

## 2015-09-15 MED ORDER — POLYVINYL ALCOHOL 1.4 % OP SOLN
1.0000 [drp] | Freq: Two times a day (BID) | OPHTHALMIC | Status: DC
  Filled 2015-09-15 (×2): qty 15

## 2015-09-15 MED ORDER — HYDRALAZINE HCL 20 MG/ML IJ SOLN
10.0000 mg | Freq: Once | INTRAMUSCULAR | Status: AC
  Administered 2015-09-15: 10 mg via INTRAVENOUS
  Filled 2015-09-15: qty 1

## 2015-09-15 MED ORDER — TRIAMCINOLONE ACETONIDE 55 MCG/ACT NA AERO
2.0000 | INHALATION_SPRAY | Freq: Every day | NASAL | Status: DC | PRN
  Filled 2015-09-15: qty 21.6

## 2015-09-15 NOTE — ED Notes (Signed)
Contacted  AC and CN regarding pt's VS and discussed changing bed placement due to tachycardia and BP

## 2015-09-15 NOTE — Progress Notes (Signed)
[place] [facility] [place] NAME: Glennie Elley    MR#:  [id number]  DATE OF BIRTH:  [birth year]  SUBJECTIVE:  CHIEF COMPLAINT:   Chief Complaint  Patient presents with  . Altered Mental Status  seems very tired. Daughter at bedside. Having bladder fullness and cramps from same. Haven't voided urine since last night  REVIEW OF SYSTEMS:  Review of Systems  Constitutional: Negative for fever, weight loss, malaise/fatigue and diaphoresis.  HENT: Negative for ear discharge, ear pain, hearing loss, nosebleeds, sore throat and tinnitus.   Eyes: Negative for blurred vision and pain.  Respiratory: Negative for cough, hemoptysis, shortness of breath and wheezing.   Cardiovascular: Negative for chest pain, palpitations, orthopnea and leg swelling.  Gastrointestinal: Negative for heartburn, nausea, vomiting, abdominal pain, diarrhea, constipation and blood in stool.  Genitourinary: Negative for dysuria, urgency and frequency.       Bladder spasm  Musculoskeletal: Negative for myalgias and back pain.  Skin: Negative for itching and rash.  Neurological: Negative for dizziness, tingling, tremors, focal weakness, seizures, weakness and headaches.  Psychiatric/Behavioral: Negative for depression. The patient is not nervous/anxious.    DRUG ALLERGIES:   Allergies  Allergen Reactions  . Amlodipine Nausea Only  . Aspirin   . Bystolic [Nebivolol Hcl]     Vision problem  . Calan [Verapamil Hcl]     Weakness and staggering  . Cardura [Doxazosin Mesylate]   . Cephalosporins   . Clonidine Derivatives     Facial swelling   . Codeine   . Cozaar   . Doxazosin   . Elavil [Amitriptyline Hcl]   . Erythromycin   . Gantrisin [Sulfisoxazole]   . Hctz [Hydrochlorothiazide]   . Iodides   . Lopressor [Metoprolol Tartrate]   . Methyldopa     Facial swelling   . Micardis [Telmisartan]   . Morphine And Related   . Nizatidine   . Nsaids   . Pantoprazole  Sodium   . Penicillins   . Prilosec [Omeprazole]   . Promethazine Hcl   . Streptomycin   . Sulfa Drugs Cross Reactors   . Tequin   . Tetracyclines & Related   . Toprol Xl [Metoprolol Succinate]   . Ultram [Tramadol]   . Valium   . Valturna [Aliskiren-Valsartan] Nausea And Vomiting   VITALS:  Blood pressure 156/76, pulse 95, temperature 97.5 F (36.4 C), temperature source Oral, resp. rate 16, height 5\' 2"  (1.575 m), weight 47.628 kg (105 lb), SpO2 97 %. PHYSICAL EXAMINATION:  Physical Exam  Constitutional: She is oriented to person, place, and time and well-developed, well-nourished, and in no distress.  HENT:  Head: Normocephalic and atraumatic.  Eyes: Conjunctivae and EOM are normal. Pupils are equal, round, and reactive to light.  Neck: Normal range of motion. Neck supple. No tracheal deviation present. No thyromegaly present.  Cardiovascular: Normal rate, regular rhythm and normal heart sounds.   Pulmonary/Chest: Effort normal and breath sounds normal. No respiratory distress. She has no wheezes. She exhibits no tenderness.  Abdominal: Soft. Bowel sounds are normal. She exhibits no distension. There is tenderness in the suprapubic area.  Bladder fullness  Musculoskeletal: Normal range of motion.  Neurological: She is alert and oriented to person, place, and time. No cranial nerve deficit.  Skin: Skin is warm and dry. No rash noted.  Psychiatric: Mood and affect normal.   LABORATORY PANEL:   CBC  Recent Labs Lab 09/15/15 1021  WBC 15.0*  HGB 14.3  HCT  42.1  PLT 262   ------------------------------------------------------------------------------------------------------------------ Chemistries   Recent Labs Lab 09/14/15 2158 09/15/15 1021  NA 131* 130*  K 2.9* 3.8  CL 102 104  CO2 21* 17*  GLUCOSE 118* 109*  BUN 21* 20  CREATININE 0.62 0.71  CALCIUM 9.0 8.5*  MG 1.9 1.8  AST 35  --   ALT 39  --   ALKPHOS 72  --   BILITOT 0.9  --    RADIOLOGY:  No  results found. ASSESSMENT AND PLAN:  82 y.o. female with a known history of Rheumatic heart disease, spinal stenosis, vertigo, hypertension and a recent admission for altered mental status with neg stroke and chest pain workup. Patient is unable to provide history due to unwillingness to talk to me directly but will answer questions through son and they said bedside who is "reading her lips" to translate and remains mute to other people. History is mostly obtained from son and daughter at bedside. Patient was discharged from the hospital 3 days ago. After discharge, patient did well on day of discharge and all day yesterday but at nighttime, patient was unable to focus.  1). Early sepsis secondary to likely UTI with pyuria - Present on admission due to possible UTI as urinalysis showed positive leukocytes, nitrites and few bacteria. - Continue IV fluids and levofloxacin and await urine cultures  - Pending blood cultures - Monitor white count and follow results of cultures and adjust antibiotics as necessary.  2). Acute encephalopathy - Likely infection related and treat UTI with levofloxacin. During the last admission 3 days ago, neurology examined the patient and recommendation was to treat possible infection.  - Patient's encephalopathy is unusual as she is very functional per her daughter. She is alert and oriented 3.  - Pending psychiatry c/s to help [place] further unravel etiology of encephalopathy - No imaging is indicated at this time due to recent extensive workup  3). Accelerated hypertension -  She takes hydralazine 25 mg QID at home, will resume same. - Monitor blood pressure closely  4). Hypokalemia/hyponatremia - Due to poor PO intake, may be contributing to the generalized weakness. - Replete potassium with 40 mEq at this time and give additional potassium in the morning - Check magnesium levels and monitor potassium level  5). Dysphagia - likely due to patient's screaming  during the last admission (3 days ago). Stroke workup was negative.  - Obtain swallow study today.      All the records are reviewed and case discussed with Care Management/Social Worker. Management plans discussed with the patient, family and they are in agreement.  CODE STATUS: DNR  TOTAL TIME TAKING CARE OF THIS PATIENT: 35 minutes.   More than 50% of the time was spent in counseling/coordination of care: YES  POSSIBLE D/C IN 1-2 DAYS, DEPENDING ON CLINICAL CONDITION.   [facility], Itamar Mcgowan M.D on 09/15/2015 at 12:34 PM  Between 7am to 6pm - Pager - [phone]  After 6pm go to [email or web address] - password [email or web address] [facility]  Office  [phone]  CC: Primary care physician; Dicky Doe, MD  Note: This dictation was prepared with Dragon dictation along with smaller phrase technology. Any transcriptional errors that result from this process are unintentional.

## 2015-09-15 NOTE — ED Notes (Signed)
Report called to floor. Request to have bed status changed. Hospitalist did not change bed status, feels pt needs to be on tele unit. CN called to manage bed change. Family updated w/ delay.

## 2015-09-15 NOTE — H&P (Signed)
[place] at [place] NAME: [name]    MR#:  [id number]  DATE OF BIRTH:  07/16/1920  DATE OF ADMISSION:  09/14/2015  PRIMARY CARE PHYSICIAN: Dicky Doe, MD   REQUESTING/REFERRING PHYSICIAN: Dr. Dahlia Client  CHIEF COMPLAINT:   Chief Complaint  Patient presents with  . Altered Mental Status    HISTORY OF PRESENT ILLNESS: [name]  is a 82 y.o. female with a known history of Rheumatic heart disease, spinal stenosis, vertigo, hypertension and a recent admission for altered mental status with a stroke and chest pain workup. Patient is unable to provide history due to unwillingness to talk to me directly but will answer questions through son and they said bedside who is "reading her lips" to translate and remains mute to other people. History is mostly obtained from son and daughter at bedside. Patient was discharged from the hospital 2 days ago. After discharge, patient did well on day of discharge and all day yesterday but at nighttime, patient was unable to focus. She is reported to have had a fear of being confused in the middle of the night and requested that daughter rearrange her bedroom so that she does not bump into items and hurt herself in the middle of the night. Patient was also reported to have begun to feel weak. All day today, patient did not eat and has been becoming increasingly weaker and more confused. She has been spelling out her words instead of pronouncing them and was unable to go to the bathroom unaided. This is unusual for her as she lives alone and functions by herself. Patient does not complain of any abdominal pain, nausea, vomiting, fall, syncope or shortness of breath but daughter (who is a Marine scientist) states that patient has had significantly decreased urine output all day and urine was foul-smelling and dark. She has had not eaten all day.   Of note, during the last admission, patient presented with similar  symptoms and a workup with CT and MRI was negative. Neurology evaluated patient at that time and suggested treating any possible infection but there was no recommendation for seizure treatment as patient was not thought to have had a seizure. Infectious disease also evaluated patient during the last admission and noted that ESR was normal. Blood cultures did not show any growth. There was no recommendation for antibiotics treatment because patient only had pyuria at that time.   On arrival in the ED today, blood pressure was 196/90 and CBC was only significant for a white count of 13.5. CMP was significant for a sodium of 131, potassium of 2.9. Digoxin level was low and TSH was normal. Urine drug screen, alcohol, salicylate and Tylenol levels were negative. Urinalysis was positive for nitrites, 1+ leukocytes and few bacteria. EKG showed sinus rhythm with left axis deviation and premature atrial complexes. Patient was given Haldol, levofloxacin, tomograms of IV hydralazine and 500 mg of normal saline in the ED. Patient will be admitted due to early sepsis with likely UTI and acute encephalopathy as well as hypokalemia. She is DO NOT INTUBATE DO NOT RESUSCITATE per living Will which was confirmed by son and daughter.  PAST MEDICAL HISTORY:   Past Medical History  Diagnosis Date  . Hypertension     a. labile HTN  . Atypical chest pain     a. nuclear stress test 04/2010: normal; b. echo 2009: EF 55-60%, mild LVH, impaired LV relaxation, mild aortic sclerosis, mild TR, nl PASP,  since echo 2007 no significant change   . History of spinal stenosis     underwent back surgery on 12/03/09 by Dr. Trey Sailors  . Hyperlipidemia   . Vertigo     positional vertigo  . Dizziness   . Muscle spasm   . Numbness in feet   . Dyspepsia   . Fatigue   . Headaches, cluster   . Joint pain   . Worsening vision   . Shortness of breath   . Heart palpitations   . Nausea and/or vomiting     with walking  .  Hypercholesterolemia     with inability to tolerate statin therapy  . Mitral valve prolapse   . History of liver disease     history of liver trouble, has not had any known esposures to jaundiced person  . History of rheumatic fever     as a child  . 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 Medication side effects     a. 30+ medication allergies and/or intolerances; b. she will not take daily antihypertensives  . Benign hypertensive heart disease without heart failure 12/14/2010    PAST SURGICAL HISTORY: Past Surgical History  Procedure Laterality Date  . Tonsillectomy    . Breast biopsy    . Bladder fulguration    . Cholecystectomy    . Total abdominal hysterectomy    . Hemorrhoid surgery    . Cervical fusion    . Carpal tunnel release      bilaterally  . Lumbar laminectomy    . Trigger finger release      x2  . Tooth extraction      SOCIAL HISTORY:  Social History  Substance Use Topics  . Smoking status: Never Smoker   . Smokeless tobacco: Never Used  . Alcohol Use: No    FAMILY HISTORY:  Family History  Problem Relation Age of Onset  . Hypertension Father   . Heart attack Father   . Heart failure Mother     DRUG ALLERGIES:  Allergies  Allergen Reactions  . Amlodipine Nausea Only  . Aspirin   . Bystolic [Nebivolol Hcl]     Vision problem  . Calan [Verapamil Hcl]     Weakness and staggering  . Cardura [Doxazosin Mesylate]   . Cephalosporins   . Clonidine Derivatives     Facial swelling   . Codeine   . Cozaar   . Doxazosin   . Elavil [Amitriptyline Hcl]   . Erythromycin   . Gantrisin [Sulfisoxazole]   . Hctz [Hydrochlorothiazide]   . Iodides   . Lopressor [Metoprolol Tartrate]   . Methyldopa     Facial swelling   . Micardis [Telmisartan]   . Morphine And Related   . Nizatidine   . Nsaids   . Pantoprazole Sodium   . Penicillins   . Prilosec [Omeprazole]   . Promethazine Hcl   . Streptomycin   . Sulfa Drugs Cross Reactors    . Tequin   . Tetracyclines & Related   . Toprol Xl [Metoprolol Succinate]   . Ultram [Tramadol]   . Valium   . Valturna [Aliskiren-Valsartan] Nausea And Vomiting    REVIEW OF SYSTEMS:  Unable to obtain review of system as patient will not communicate with me directly  MEDICATIONS AT HOME:  Prior to Admission medications   Medication Sig Start Date End Date Taking? Authorizing Provider  digoxin (LANOXIN) 0.125 MG tablet Take 0.0625 mg by mouth See admin instructions. On Monday,  Wednesday, and Friday   Yes Historical Provider, MD  digoxin (LANOXIN) 0.125 MG tablet Take 0.125 mg by mouth See admin instructions. On Tuesday, Thursday, Saturday, and Sunday   Yes Historical Provider, MD  dimenhyDRINATE (DRAMAMINE) 50 MG tablet Take 50 mg by mouth every 8 (eight) hours as needed (for nausea).   Yes Historical Provider, MD  nitroGLYCERIN (NITROSTAT) 0.4 MG SL tablet Place 0.4 mg under the tongue every 5 (five) minutes as needed for chest pain.   Yes Historical Provider, MD  Polyethyl Glycol-Propyl Glycol (SYSTANE ULTRA PF) 0.4-0.3 % SOLN Place 1 drop into both eyes 2 (two) times daily.   Yes Historical Provider, MD  Simethicone (GAS-X EXTRA STRENGTH) 125 MG CAPS Take 125 mg by mouth as needed (for gas relief).   Yes Historical Provider, MD  sodium chloride (OCEAN) 0.65 % nasal spray Place 1 spray into the nose as needed for congestion.   Yes Historical Provider, MD  triamcinolone (NASACORT ALLERGY 24HR) 55 MCG/ACT AERO nasal inhaler Place 2 sprays into the nose daily as needed (for allergies).    Yes Historical Provider, MD      PHYSICAL EXAMINATION:   VITAL SIGNS: Blood pressure 183/92, pulse 106, temperature 98.3 F (36.8 C), temperature source Oral, resp. rate 12, height '5\' 2"'$  (1.575 m), weight 48.535 kg (107 lb), SpO2 98 %.  GENERAL:  Frail 82 y.o.-year-old patient lying in the bed with no acute distress. Alert and oriented x 3 and follows commands but is holding sounds hand and will only  talk to the son for son to relay to medical staff EYES: Pupils equal, round, reactive to light and accommodation. No scleral icterus. Extraocular muscles intact.  HEENT: Head atraumatic, normocephalic. Oropharynx and nasopharynx clear.  NECK:  Supple, no jugular venous distention. No thyroid enlargement, no tenderness.  LUNGS: Normal breath sounds bilaterally, no wheezing, rales,rhonchi or crepitation. No use of accessory muscles of respiration.  CARDIOVASCULAR: S1, S2 normal. No murmurs, rubs, or gallops.  ABDOMEN: Soft, + mild to moderate suprapubic tenderness, nondistended. Bowel sounds present. No organomegaly or mass.  EXTREMITIES: No pedal edema, cyanosis, or clubbing.  NEUROLOGIC: Cranial nerves II through XII are intact. No focal weakness. Muscle strength 5/5 in all extremities. Sensation intact.   SKIN: No obvious rash, lesion, or ulcer.   LABORATORY PANEL:   CBC  Recent Labs Lab 09/11/15 0007 09/11/15 0956 09/12/15 0454 09/14/15 2158  WBC 18.8* 15.5* 15.1* 13.5*  HGB 14.3 14.3 13.1 14.4  HCT 41.6 42.3 38.0 42.4  PLT 260 279 266 277  MCV 94.1 92.7 94.4 92.3  MCH 32.3 31.4 32.5 31.4  MCHC 34.3 33.8 34.4 34.0  RDW 13.4 13.1 13.4 12.8   ------------------------------------------------------------------------------------------------------------------  Chemistries   Recent Labs Lab 09/11/15 0007 09/11/15 0956 09/12/15 0454 09/14/15 2158  NA 132* 132* 136 131*  K 3.4* 4.0 3.8 2.9*  CL 102 103 109 102  CO2 19* 19* 19* 21*  GLUCOSE 108* 127* 105* 118*  BUN 16 18 25* 21*  CREATININE 0.66 0.69 0.79 0.62  CALCIUM 9.1 9.1 8.5* 9.0  AST 31  --   --  35  ALT 33  --   --  39  ALKPHOS 65  --   --  72  BILITOT 0.8  --   --  0.9   ------------------------------------------------------------------------------------------------------------------ estimated creatinine clearance is 34.4 mL/min (by C-G formula based on Cr of  0.62). ------------------------------------------------------------------------------------------------------------------  Recent Labs  09/14/15 2158  TSH 2.619     Coagulation profile No  results for input(s): INR, PROTIME in the last 168 hours. ------------------------------------------------------------------------------------------------------------------- No results for input(s): DDIMER in the last 72 hours. -------------------------------------------------------------------------------------------------------------------  Cardiac Enzymes  Recent Labs Lab 09/11/15 0956 09/11/15 1447 09/11/15 2057  TROPONINI 0.06* 0.08* 0.09*   ------------------------------------------------------------------------------------------------------------------ Invalid input(s): POCBNP  ---------------------------------------------------------------------------------------------------------------  Urinalysis    Component Value Date/Time   COLORURINE YELLOW* 09/14/2015 2158   COLORURINE Yellow 07/06/2011 1057   APPEARANCEUR HAZY* 09/14/2015 2158   APPEARANCEUR Clear 07/06/2011 1057   LABSPEC 1.008 09/14/2015 2158   LABSPEC 1.008 07/06/2011 1057   PHURINE 6.0 09/14/2015 2158   PHURINE 7.0 07/06/2011 1057   GLUCOSEU NEGATIVE 09/14/2015 2158   GLUCOSEU Negative 07/06/2011 1057   HGBUR NEGATIVE 09/14/2015 2158   HGBUR 1+ 07/06/2011 1057   BILIRUBINUR NEGATIVE 09/14/2015 2158   BILIRUBINUR Negative 07/06/2011 [place] 09/14/2015 2158   KETONESUR Trace 07/06/2011 1057   PROTEINUR NEGATIVE 09/14/2015 2158   PROTEINUR Negative 07/06/2011 1057   UROBILINOGEN 0.2 09/12/2012 2013   NITRITE POSITIVE* 09/14/2015 2158   NITRITE Negative 07/06/2011 1057   LEUKOCYTESUR 1+* 09/14/2015 2158   LEUKOCYTESUR Trace 07/06/2011 1057     RADIOLOGY: No results found.  EKG: Orders placed or performed during the hospital encounter of 09/14/15  . ED EKG  . ED EKG  . EKG 12-Lead  .  EKG 12-Lead    ASSESSMENT  Principal Problem:   Early Sepsis  Active Problems:   Pyuria - likely UTI   Acute encephalopathy   Hypokalemia   Hyponatremia   Accelerated hypertension  PLAN   1). Early sepsis secondary to likely UTI with pyuria - Patient is tachycardic and has a white count of 13.5 with a possible UTI as urinalysis shows positive leukocytes, nitrites and few bacteria. - Patient has been started on IV fluids and levofloxacin and urine cultures were sent. We'll follow results of urine cultures. - Obtain blood cultures - Monitor white count and follow results of cultures and adjust antibiotics as necessary.  2). Acute encephalopathy - Acute encephalopathy is thought to be infection related and we will treat UTI with levofloxacin. During the last admission 3 days ago, neurology examined the patient and recommendation was to treat possible infection.  - Patient's encephalopathy is unusual as she is very functional and talks through her son. She is alert and oriented 3.  - I will consult psychiatry to help [place] further unravel etiology of encephalopathy - No imaging is indicated at this time due to recent extensive workup  3). Accelerated hypertension - Patient has accelerated hypertension at this time with a blood pressure of 196/90. She is not on any home medications for hypertension. We have commenced hydralazine 10 mg every 4 hours IV for accelerated hypertension. We will also add by mouth hydralazine as patient is allergic to most antihypertensives - Monitor blood pressure closely  4). Hypokalemia/hyponatremia - Patient has hypokalemia of 2.9. Sodium is also low at 131. This may be due to low dietary intake as patient has not been able to eat due to altered mental status and dysphagia. Hypokalemia may have contributed largely to the generalized weakness. - Replete potassium with 40 mEq at this time and give additional potassium in the morning - Check magnesium levels and  monitor potassium level  5). Dysphagia - Patient has been having dysphagia since last admission and this was thought to be due to patient's screaming during the last admission(3 days ago). Stroke workup was negative.  - Obtain swallow study in the morning.  All the records are reviewed and case discussed with ED provider.  Management plans discussed with the patient, family and they are in agreement.  CODE STATUS: Code Status History    Date Active Date Inactive Code Status Order ID Comments User Context   09/11/2015  3:14 AM 09/12/2015  7:01 PM DNR [id number]  Saundra Shelling, MD ED   09/12/2012 11:51 PM 09/15/2012  8:07 PM Full Code [id number]  Rise Patience, MD Inpatient    Questions for Most Recent Historical Code Status (Order [id number])    Question Answer Comment   In the event of cardiac or respiratory ARREST Do not call a "code blue"    In the event of cardiac or respiratory ARREST Do not perform Intubation, CPR, defibrillation or ACLS    In the event of cardiac or respiratory ARREST Use medication by any route, position, wound care, and other measures to relive pain and suffering. May use oxygen, suction and manual treatment of airway obstruction as needed for comfort.     Advance Directive Documentation        Most Recent Value   Type of Advance Directive  Healthcare Power of Attorney   Pre-existing out of facility DNR order (yellow form or pink MOST form)     "MOST" Form in Place?        I have independently reviewed all EKG and chest x-ray data  VTE prophylaxis: Heparin if no contraindications and patient at low risk for bleeding. SCD's and progressive ambulation if patient has contraindications to anticoagulants. No DVT prophylaxis if patient presently receiving therapeutic anticoagulation or is at significant risk of bleeding for which the risk of anticoagulation outweigh the potential benefits.  Vaccinations: Pneumonia & flu vaccine per hospital protocol Prevention: Will  proceed with conservative measures for the prevention of delirium in patients older than 65. Fall precautions and 1:1 sitter as needed per hospital protocol.  TOTAL TIME TAKING CARE OF THIS PATIENT: 60 minutes.    Sylvan Cheese M.D on 09/15/2015 at 1:46 AM  Between 7am to 6pm - Pager - [phone]  After 6pm go to [email or web address] - password [email or web address] [facility]  Office  [phone]  CC: Primary care physician; Dicky Doe, MD

## 2015-09-15 NOTE — ED Notes (Signed)
Mouth moisturizer and oral rinse provided

## 2015-09-15 NOTE — Progress Notes (Signed)
Pharmacy Antibiotic Note  [name] is a 82 y.o. female admitted on 09/14/2015 with UTI.  Pharmacy has been consulted for levofloxacin dosing.  Plan: Levofloxacin 750 mg IV x 1 in ED followed by levofloxacin 250 mg IV Q24H  Height: 5\' 2"  (157.5 cm) Weight: 107 lb (48.535 kg) IBW/kg (Calculated) : 50.1  Temp (24hrs), Avg:98.3 F (36.8 C), Min:98.3 F (36.8 C), Max:98.3 F (36.8 C)   Recent Labs Lab 09/11/15 0007 09/11/15 0956 09/12/15 0454 09/14/15 2158  WBC 18.8* 15.5* 15.1* 13.5*  CREATININE 0.66 0.69 0.79 0.62    Estimated Creatinine Clearance: 34.4 mL/min (by C-G formula based on Cr of 0.62).    Allergies  Allergen Reactions  . Amlodipine Nausea Only  . Aspirin   . Bystolic [Nebivolol Hcl]     Vision problem  . Calan [Verapamil Hcl]     Weakness and staggering  . Cardura [Doxazosin Mesylate]   . Cephalosporins   . Clonidine Derivatives     Facial swelling   . Codeine   . Cozaar   . Doxazosin   . Elavil [Amitriptyline Hcl]   . Erythromycin   . Gantrisin [Sulfisoxazole]   . Hctz [Hydrochlorothiazide]   . Iodides   . Lopressor [Metoprolol Tartrate]   . Methyldopa     Facial swelling   . Micardis [Telmisartan]   . Morphine And Related   . Nizatidine   . Nsaids   . Pantoprazole Sodium   . Penicillins   . Prilosec [Omeprazole]   . Promethazine Hcl   . Streptomycin   . Sulfa Drugs Cross Reactors   . Tequin   . Tetracyclines & Related   . Toprol Xl [Metoprolol Succinate]   . Ultram [Tramadol]   . Valium   . Valturna [Aliskiren-Valsartan] Nausea And Vomiting     Thank you for allowing pharmacy to be a part of this patient's care.  Laural Benes, Pharm.D., BCPS Clinical Pharmacist 09/15/2015 6:51 AM

## 2015-09-15 NOTE — ED Notes (Signed)
Dr. Estanislado Pandy called to change order for bed placement to Med-surg bed w/ off-unit telemetry.

## 2015-09-15 NOTE — ED Provider Notes (Signed)
[facility] Emergency Department Provider Note  ____________________________________________  Time seen: Approximately 2313 PM  I have reviewed the triage vital signs and the nursing notes.   HISTORY  Chief Complaint Altered Mental Status  History is obtained from the patient's daughter.  HPI [name] is a 82 y.o. female comes into the hospital today not acting herself.The patient was recently admitted to the hospital with similar symptoms. She was just discharged from the hospital on Friday. The patient had been admitted to the hospital when she had some elevated blood pressure with jerky movements and inability to speak. She was back to her baseline on Thursday after being admitted. The patient had gone from reporting she couldn't speak to not being able to move although she did show signs of speaking and moving. She normally lives alone and cares for herself. The patient was doing well and by Friday evening was discharged home with home health care. Yesterday the patient seemed to be fixated on certain things and wanted her daughter to move things constantly and this morning she said she couldn't swallow. They report they tried to feed her chicken and then passed out but the patient was able to suck the sauce off the posterior. The patient Repeating herself and asking the same questions over and over. He reports that by this evening she was spelling things. The patient reports that she is mute and only her friend is able to translate for her. The patient was here with similar symptoms before. The patient's previous CT scan and MRI were unremarkable. The patient's daughter reports that on admission her urine was clean but by the time she was admitted upstairs it was foul-smelling and dark. The patient's daughter reports that her urine has remained foul-smelling and dark. The patient reports that she has neck pain but she is unable to quantify her pain. She also  reports that she has back pain which is chronic as well. The patient reports that she cannot move her entire body except for her face. The patient is here for evaluation.   Past Medical History  Diagnosis Date  . Hypertension     a. labile HTN  . Atypical chest pain     a. nuclear stress test 04/2010: normal; b. echo 2009: EF 55-60%, mild LVH, impaired LV relaxation, mild aortic sclerosis, mild TR, nl PASP, since echo 2007 no significant change   . History of spinal stenosis     underwent back surgery on 12/03/09 by Dr. Glenna Fellows  . Hyperlipidemia   . Vertigo     positional vertigo  . Dizziness   . Muscle spasm   . Numbness in feet   . Dyspepsia   . Fatigue   . Headaches, cluster   . Joint pain   . Worsening vision   . Shortness of breath   . Heart palpitations   . Nausea and/or vomiting     with walking  . Hypercholesterolemia     with inability to tolerate statin therapy  . Mitral valve prolapse   . History of liver disease     history of liver trouble, has not had any known esposures to jaundiced person  . History of rheumatic fever     as a child  . 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 Medication side effects     a. 30+ medication allergies and/or intolerances; b. she will not take daily antihypertensives    Patient Active Problem List   Diagnosis  Date Noted  . Altered mental status 09/11/2015  . Hypertensive urgency 09/11/2015  . Altered mental state 09/11/2015  . Delirium 09/11/2015  . Medication side effects   . Atypical chest pain   . Palpitations 09/25/2012  . Labile blood pressure 09/15/2012  . ? Orthostatic hypotension 09/13/2012  . Gait instability 09/12/2012  . Headache(784.0) 09/12/2012  . Hypertension, accelerated 09/12/2012  . Nausea alone 06/28/2011  . Benign hypertensive heart disease without heart failure 12/14/2010  . Spinal stenosis of lumbar region 12/14/2010  . History of vertigo 12/14/2010  . Hypercholesterolemia  12/14/2010    Past Surgical History  Procedure Laterality Date  . Tonsillectomy    . Breast biopsy    . Bladder fulguration    . Cholecystectomy    . Total abdominal hysterectomy    . Hemorrhoid surgery    . Cervical fusion    . Carpal tunnel release      bilaterally  . Lumbar laminectomy    . Trigger finger release      x2  . Tooth extraction      Current Outpatient Rx  Name  Route  Sig  Dispense  Refill  . digoxin (LANOXIN) 0.125 MG tablet   Oral   Take 0.0625 mg by mouth See admin instructions. On Monday, Wednesday, and Friday         . digoxin (LANOXIN) 0.125 MG tablet   Oral   Take 0.125 mg by mouth See admin instructions. On Tuesday, Thursday, Saturday, and Sunday         . dimenhyDRINATE (DRAMAMINE) 50 MG tablet   Oral   Take 50 mg by mouth every 8 (eight) hours as needed (for nausea).         . nitroGLYCERIN (NITROSTAT) 0.4 MG SL tablet   Sublingual   Place 0.4 mg under the tongue every 5 (five) minutes as needed for chest pain.         Vladimir Faster Glycol-Propyl Glycol (SYSTANE ULTRA PF) 0.4-0.3 % SOLN   Both Eyes   Place 1 drop into both eyes 2 (two) times daily.         . Simethicone (GAS-X EXTRA STRENGTH) 125 MG CAPS   Oral   Take 125 mg by mouth as needed (for gas relief).         . sodium chloride (OCEAN) 0.65 % nasal spray   Nasal   Place 1 spray into the nose as needed for congestion.         . triamcinolone (NASACORT ALLERGY 24HR) 55 MCG/ACT AERO nasal inhaler   Nasal   Place 2 sprays into the nose daily as needed (for allergies).            Allergies Amlodipine; Aspirin; Bystolic; Calan; Cardura; Cephalosporins; Clonidine derivatives; Codeine; Cozaar; Doxazosin; Elavil; Erythromycin; Gantrisin; Hctz; Iodides; Lopressor; Methyldopa; Micardis; Morphine and related; Nizatidine; Nsaids; Pantoprazole sodium; Penicillins; Prilosec; Promethazine hcl; Streptomycin; Sulfa drugs cross reactors; Tequin; Tetracyclines & related; Toprol xl;  Ultram; Valium; and Valturna  Family History  Problem Relation Age of Onset  . Hypertension Father   . Heart attack Father   . Heart failure Mother     Social History Social History  Substance Use Topics  . Smoking status: Never Smoker   . Smokeless tobacco: Never Used  . Alcohol Use: No    Review of Systems Constitutional: No fever/chills Eyes: No visual changes. ENT: No sore throat. Cardiovascular: Denies chest pain. Respiratory: Cough with mild shortness of breath. Gastrointestinal:  abdominal pain.  No nausea, no vomiting.  No diarrhea.  No constipation. Genitourinary: Negative for dysuria. Musculoskeletal: Neck and back pain. Skin: Negative for rash. Neurological: Negative for headaches, focal weakness or numbness. Psych: Bizarre behavior and inability to perform certain tasks.  10-point ROS otherwise negative.  ____________________________________________   PHYSICAL EXAM:  VITAL SIGNS: ED Triage Vitals  Enc Vitals Group     BP 09/14/15 2142 175/82 mmHg     Pulse Rate 09/14/15 2142 93     Resp 09/14/15 2142 18     Temp 09/14/15 2142 98.3 F (36.8 C)     Temp Source 09/14/15 2142 Oral     SpO2 09/14/15 2142 98 %     Weight 09/14/15 2142 107 lb (48.535 kg)     Height 09/14/15 2142 5\' 2"  (1.575 m)     Head Cir --      Peak Flow --      Pain Score 09/14/15 2254 10     Pain Loc --      Pain Edu? --      Excl. in [place]? --     Constitutional: Alert and oriented. Well appearing and in no acute distress. Eyes: Conjunctivae are normal. PERRL. EOMI. Head: Atraumatic. Nose: No congestion/rhinnorhea. Mouth/Throat: Mucous membranes are moist.  Oropharynx non-erythematous. Cardiovascular: Normal rate,  Grossly normal heart sounds.  Good peripheral circulation. Respiratory: Normal respiratory effort.  No retractions. Lungs CTAB. Gastrointestinal: Soft some suprapubic tenderness to palpation. No distention. Positive bowel sounds Musculoskeletal: No lower extremity  tenderness nor edema.   Neurologic:  Normal speech and language. Patient has intact facial sensation and has a symmetrical smile and no tongue deviation. She reports that she is unable to move her arms and her legs and unable to shrug her shoulders. The patient according to the family though was able to stand when she came in by wheelchair and was able to move her legs at different times. I am unable to fully assess the patient's neurologic status. Skin:  Skin is warm, dry and intact. Psychiatric: Mood and affect are normal.   ____________________________________________   LABS (all labs ordered are listed, but only abnormal results are displayed)  Labs Reviewed  COMPREHENSIVE METABOLIC PANEL - Abnormal; Notable for the following:    Sodium 131 (*)    Potassium 2.9 (*)    CO2 21 (*)    Glucose, Bld 118 (*)    BUN 21 (*)    All other components within normal limits  ACETAMINOPHEN LEVEL - Abnormal; Notable for the following:    Acetaminophen (Tylenol), Serum <10 (*)    All other components within normal limits  CBC - Abnormal; Notable for the following:    WBC 13.5 (*)    All other components within normal limits  URINALYSIS COMPLETEWITH MICROSCOPIC ([facility] ONLY) - Abnormal; Notable for the following:    Color, Urine YELLOW (*)    APPearance HAZY (*)    Nitrite POSITIVE (*)    Leukocytes, UA 1+ (*)    Bacteria, UA FEW (*)    All other components within normal limits  DIGOXIN LEVEL - Abnormal; Notable for the following:    Digoxin Level 0.5 (*)    All other components within normal limits  URINE CULTURE  ETHANOL  SALICYLATE LEVEL  URINE DRUG SCREEN, QUALITATIVE ([facility] ONLY)  TSH   ____________________________________________  EKG  ED ECG REPORT I, Loney Hering, the attending physician, personally viewed and interpreted this ECG.   Date: 09/14/2015  EKG Time: 2352  Rate: 82  Rhythm: normal sinus rhythm  Axis: Left axis deviation  Intervals:none  ST&T Change:  None  ____________________________________________  RADIOLOGY  None ____________________________________________   PROCEDURES  Procedure(s) performed: None  Critical Care performed: No  ____________________________________________   INITIAL IMPRESSION / ASSESSMENT AND PLAN / ED COURSE  Pertinent labs & imaging results that were available during my care of the patient were reviewed by me and considered in my medical decision making (see chart for details).  This is a 82 year old female who comes into the hospital today with strange bizarre behavior. The patient was admitted recently with similar symptoms and her workup was negative. The patient did receive some Haldol and they felt that it was may be psychiatric. The patient though does have some foul-smelling and dark urine with too numerous to count white blood cells in her urine. I'm concerned the patient may have a urinary tract infection which may be contributing to her symptoms. I will give the patient a dose of levofloxacin as well as Haldol 1 mg IM. I will give the patient normal saline 500 ML's IV and I will admit the patient to the hospitalist service. I will also give the patient dose of hydralazine for her elevated blood pressure. ____________________________________________   FINAL CLINICAL IMPRESSION(S) / ED DIAGNOSES  Final diagnoses:  Altered mental status, unspecified altered mental status type  Acute cystitis without hematuria      Loney Hering, MD 09/15/15 [phone]

## 2015-09-15 NOTE — Care Management (Signed)
Admitted to [facility] with the diagnosis of altered mental status. Discharged from this facility 09/12/15. Daughter is Hubert Azure [phone] or [phone]) Son is Jenny Reichmann. Puppy dog lives with her. Family members have been staying with Ms. Dandridge since discharged from this facility. Next appointment with Dr. Luan Pulling is April 18th 2017. Home Care Providers sees Ms. Muller 2 hours a day Monday-Friday. Uses a rollator walker to aid in ambulation.  Shelbie Ammons RN MSN CCM Care Management [phone]

## 2015-09-15 NOTE — Consult Note (Signed)
[facility] Face-to-Face Psychiatry Consult   Reason for Consult:  Consult for this 82 year old woman currently in the hospital with altered mental status for some assistance with diagnosis and treatment. Referring Physician:  Manuella Ghazi Patient Identification: [name] MRN:  [id number] Principal Diagnosis: Altered mental status Diagnosis:   Patient Active Problem List   Diagnosis Date Noted  . Pyuria - likely UTI [N39.0] 09/15/2015  . Acute encephalopathy [G93.40] 09/15/2015  . Hypokalemia [E87.6] 09/15/2015  . Hyponatremia [E87.1] 09/15/2015  . Early Sepsis  [A41.9] 09/15/2015  . Accelerated hypertension [I10] 09/15/2015  . Dysphagia [R13.10] 09/15/2015  . Depression [F32.9] 09/15/2015  . Altered mental status [R41.82] 09/11/2015  . Hypertensive urgency [I16.0] 09/11/2015  . Altered mental state [R41.82] 09/11/2015  . Delirium [R41.0] 09/11/2015  . Medication side effects [T88.7XXA]   . Atypical chest pain [R07.89]   . Palpitations [R00.2] 09/25/2012  . Labile blood pressure [R03.0] 09/15/2012  . ? Orthostatic hypotension [I95.1] 09/13/2012  . Gait instability [R26.81] 09/12/2012  . Headache(784.0) [R51] 09/12/2012  . Hypertension, accelerated [I10] 09/12/2012  . Nausea alone [R11.0] 06/28/2011  . Benign hypertensive heart disease without heart failure [I11.9] 12/14/2010  . Spinal stenosis of lumbar region [M48.06] 12/14/2010  . History of vertigo [Z86.69] 12/14/2010  . Hypercholesterolemia [E78.00] 12/14/2010    Total Time spent with patient: 1 hour  Subjective:   [name] is a 82 y.o. female patient admitted with "I can't hear you".  HPI:  Patient interviewed as best I could. Her son was present in the room and gave a significant amount of useful history. Chart reviewed labs reviewed. This 82 year old woman was brought into the hospital because her adult children had noticed that her mental status had started declining again over the last couple days. She was responding  strangely, not eating well, seemed more confused, seemed very anxious. Patient had just been in the hospital last week with a similar presentation. That apparently had improved over a couple days and she was sent home in the middle of last week and by her son's report had a couple of decent days before things started to go downhill again. The patient herself is either unable or unwilling to give me any history. She has a very strange presentation. She tells me multiple times that she can not hear me but she can respond appropriately to questions asked in a normal tone of voice by both myself and her son. Midway through the interview she started saying that she was not able to see me or see her son either. Patient did acknowledge that she was feeling anxious and that I was making her upset by asking her so many questions. She was not able to identify any particular recent stress. Was not able really to go into much further detail and after a limited period of time started to insist that I should leave her alone and leave the room. Patient is not currently on any psychiatric medicine. She had been seen by neurology who do not believe that there has been a new neurologic development or any sign of seizures. She was seen to have a likely urinary tract infection which is being treated and has been proposed as a possible etiology.  Social history: By the son's report the patient had been living independently and taking care of herself and her dog until just a couple weeks ago when things started to decline. She has 2 adult children one of whom is a nurse here at our hospital. Her son  whom I spoke to lives in [place] but has been up here helping out for a couple weeks. Patient is widowed. Son describes her as being a long-standing perfectionist but always mentally very sharp.  Substance abuse history: No history of alcohol or drug abuse.  Medical history: Multiple medical problems including hypertension  history of atypical chest pain history of dysphasia history of hyponatremia pyuria.  Past Psychiatric History: By the report of the sun and everything I can see in the chart there is no past psychiatric history. As I noted the son described his mother as having always been a bit of a perfectionist and perhaps a little on the anxious side but had never been identified as a treatable problem. No history of hospitalization. No history of suicidal behavior or psychotic behavior. No known history of treatment with psychiatric medicine.  Risk to Self: Is patient at risk for suicide?: No Risk to Others:   Prior Inpatient Therapy:   Prior Outpatient Therapy:    Past Medical History:  Past Medical History  Diagnosis Date  . Hypertension     a. labile HTN  . Atypical chest pain     a. nuclear stress test 04/2010: normal; b. echo 2009: EF 55-60%, mild LVH, impaired LV relaxation, mild aortic sclerosis, mild TR, nl PASP, since echo 2007 no significant change   . History of spinal stenosis     underwent back surgery on 12/03/09 by Dr. Glenna Fellows  . Hyperlipidemia   . Vertigo     positional vertigo  . Dizziness   . Muscle spasm   . Numbness in feet   . Dyspepsia   . Fatigue   . Headaches, cluster   . Joint pain   . Worsening vision   . Shortness of breath   . Heart palpitations   . Nausea and/or vomiting     with walking  . Hypercholesterolemia     with inability to tolerate statin therapy  . Mitral valve prolapse   . History of liver disease     history of liver trouble, has not had any known esposures to jaundiced person  . History of rheumatic fever     as a child  . 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 Medication side effects     a. 30+ medication allergies and/or intolerances; b. she will not take daily antihypertensives  . Benign hypertensive heart disease without heart failure 12/14/2010    Past Surgical History  Procedure Laterality Date  . Tonsillectomy    .  Breast biopsy    . Bladder fulguration    . Cholecystectomy    . Total abdominal hysterectomy    . Hemorrhoid surgery    . Cervical fusion    . Carpal tunnel release      bilaterally  . Lumbar laminectomy    . Trigger finger release      x2  . Tooth extraction     Family History:  Family History  Problem Relation Age of Onset  . Hypertension Father   . Heart attack Father   . Heart failure Mother    Family Psychiatric  History: There was a limited amount I can learn about this without the patient being cooperative. Was not able to identify from interview with the son and the chart whether there is any family history of mental illness Social History:  History  Alcohol Use No     History  Drug Use No    Social History  Social History  . Marital Status: Widowed    Spouse Name: N/A  . Number of Children: N/A  . Years of Education: N/A   Social History Main Topics  . Smoking status: Never Smoker   . Smokeless tobacco: Never Used  . Alcohol Use: No  . Drug Use: No  . Sexual Activity: Not Asked   Other Topics Concern  . None   Social History Narrative   Additional Social History:    Allergies:   Allergies  Allergen Reactions  . Amlodipine Nausea Only  . Aspirin   . Bystolic [Nebivolol Hcl]     Vision problem  . Calan [Verapamil Hcl]     Weakness and staggering  . Cardura [Doxazosin Mesylate]   . Cephalosporins   . Clonidine Derivatives     Facial swelling   . Codeine   . Cozaar   . Doxazosin   . Elavil [Amitriptyline Hcl]   . Erythromycin   . Gantrisin [Sulfisoxazole]   . Hctz [Hydrochlorothiazide]   . Iodides   . Lopressor [Metoprolol Tartrate]   . Methyldopa     Facial swelling   . Micardis [Telmisartan]   . Morphine And Related   . Nizatidine   . Nsaids   . Pantoprazole Sodium   . Penicillins   . Prilosec [Omeprazole]   . Promethazine Hcl   . Streptomycin   . Sulfa Drugs Cross Reactors   . Tequin   . Tetracyclines & Related   .  Toprol Xl [Metoprolol Succinate]   . Ultram [Tramadol]   . Valium   . Valturna [Aliskiren-Valsartan] Nausea And Vomiting    Labs:  Results for orders placed or performed during the hospital encounter of 09/14/15 (from the past 48 hour(s))  Comprehensive metabolic panel     Status: Abnormal   Collection Time: 09/14/15  9:58 PM  Result Value Ref Range   Sodium 131 (L) 135 - 145 mmol/L   Potassium 2.9 (LL) 3.5 - 5.1 mmol/L    Comment: CRITICAL RESULT CALLED TO, READ BACK BY AND VERIFIED WITH RACHEL HAYDEN AT 2322 09/14/15.PMH   Chloride 102 101 - 111 mmol/L   CO2 21 (L) 22 - 32 mmol/L   Glucose, Bld 118 (H) 65 - 99 mg/dL   BUN 21 (H) 6 - 20 mg/dL   Creatinine, Ser 0.62 0.44 - 1.00 mg/dL   Calcium 9.0 8.9 - 10.3 mg/dL   Total Protein 6.9 6.5 - 8.1 g/dL   Albumin 4.2 3.5 - 5.0 g/dL   AST 35 15 - 41 U/L   ALT 39 14 - 54 U/L   Alkaline Phosphatase 72 38 - 126 U/L   Total Bilirubin 0.9 0.3 - 1.2 mg/dL   GFR calc non Af Amer >60 >60 mL/min   GFR calc Af Amer >60 >60 mL/min    Comment: (NOTE) The eGFR has been calculated using the CKD EPI equation. This calculation has not been validated in all clinical situations. eGFR's persistently <60 mL/min signify possible Chronic Kidney Disease.    Anion gap 8 5 - 15  Ethanol (ETOH)     Status: None   Collection Time: 09/14/15  9:58 PM  Result Value Ref Range   Alcohol, Ethyl (B) <5 <5 mg/dL    Comment:        LOWEST DETECTABLE LIMIT FOR SERUM ALCOHOL IS 5 mg/dL FOR MEDICAL PURPOSES ONLY   Salicylate level     Status: None   Collection Time: 09/14/15  9:58 PM  Result Value Ref  Range   Salicylate Lvl <0.3 2.8 - 30.0 mg/dL  Acetaminophen level     Status: Abnormal   Collection Time: 09/14/15  9:58 PM  Result Value Ref Range   Acetaminophen (Tylenol), Serum <10 (L) 10 - 30 ug/mL    Comment:        THERAPEUTIC CONCENTRATIONS VARY SIGNIFICANTLY. A RANGE OF 10-30 ug/mL MAY BE AN EFFECTIVE CONCENTRATION FOR MANY PATIENTS. HOWEVER, SOME  ARE BEST TREATED AT CONCENTRATIONS OUTSIDE THIS RANGE. ACETAMINOPHEN CONCENTRATIONS >150 ug/mL AT 4 HOURS AFTER INGESTION AND >50 ug/mL AT 12 HOURS AFTER INGESTION ARE OFTEN ASSOCIATED WITH TOXIC REACTIONS.   CBC     Status: Abnormal   Collection Time: 09/14/15  9:58 PM  Result Value Ref Range   WBC 13.5 (H) 3.6 - 11.0 K/uL   RBC 4.59 3.80 - 5.20 MIL/uL   Hemoglobin 14.4 12.0 - 16.0 g/dL   HCT 42.4 35.0 - 47.0 %   MCV 92.3 80.0 - 100.0 fL   MCH 31.4 26.0 - 34.0 pg   MCHC 34.0 32.0 - 36.0 g/dL   RDW 12.8 11.5 - 14.5 %   Platelets 277 150 - 440 K/uL  Urine Drug Screen, Qualitative ([facility] only)     Status: None   Collection Time: 09/14/15  9:58 PM  Result Value Ref Range   Tricyclic, Ur Screen NONE DETECTED NONE DETECTED   Amphetamines, Ur Screen NONE DETECTED NONE DETECTED   MDMA (Ecstasy)Ur Screen NONE DETECTED NONE DETECTED   Cocaine Metabolite,Ur [place] NONE DETECTED NONE DETECTED   Opiate, Ur Screen NONE DETECTED NONE DETECTED   Phencyclidine (PCP) Ur S NONE DETECTED NONE DETECTED   Cannabinoid 50 Ng, Ur [place] NONE DETECTED NONE DETECTED   Barbiturates, Ur Screen NONE DETECTED NONE DETECTED   Benzodiazepine, Ur Scrn NONE DETECTED NONE DETECTED   Methadone Scn, Ur NONE DETECTED NONE DETECTED    Comment: (NOTE) 500  Tricyclics, urine               Cutoff 1000 ng/mL 200  Amphetamines, urine             Cutoff 1000 ng/mL 300  MDMA (Ecstasy), urine           Cutoff 500 ng/mL 400  Cocaine Metabolite, urine       Cutoff 300 ng/mL 500  Opiate, urine                   Cutoff 300 ng/mL 600  Phencyclidine (PCP), urine      Cutoff 25 ng/mL 700  Cannabinoid, urine              Cutoff 50 ng/mL 800  Barbiturates, urine             Cutoff 200 ng/mL 900  Benzodiazepine, urine           Cutoff 200 ng/mL 1000 Methadone, urine                Cutoff 300 ng/mL 1100 1200 The urine drug screen provides only a preliminary, unconfirmed 1300 analytical test result and should not be used for  non-medical 1400 purposes. Clinical consideration and professional judgment should 1500 be applied to any positive drug screen result due to possible 1600 interfering substances. A more specific alternate chemical method 1700 must be used in order to obtain a confirmed analytical result.  1800 Gas chromato graphy / mass spectrometry (GC/MS) is the preferred 1900 confirmatory method.   TSH     Status: None  Collection Time: 09/14/15  9:58 PM  Result Value Ref Range   TSH 2.619 0.350 - 4.500 uIU/mL  Urinalysis complete, with microscopic ([facility] only)     Status: Abnormal   Collection Time: 09/14/15  9:58 PM  Result Value Ref Range   Color, Urine YELLOW (A) YELLOW   APPearance HAZY (A) CLEAR   Glucose, UA NEGATIVE NEGATIVE mg/dL   Bilirubin Urine NEGATIVE NEGATIVE   Ketones, ur NEGATIVE NEGATIVE mg/dL   Specific Gravity, Urine 1.008 1.005 - 1.030   Hgb urine dipstick NEGATIVE NEGATIVE   pH 6.0 5.0 - 8.0   Protein, ur NEGATIVE NEGATIVE mg/dL   Nitrite POSITIVE (A) NEGATIVE   Leukocytes, UA 1+ (A) NEGATIVE   RBC / HPF 0-5 0 - 5 RBC/hpf   WBC, UA TOO NUMEROUS TO COUNT 0 - 5 WBC/hpf   Bacteria, UA FEW (A) NONE SEEN   Squamous Epithelial / LPF NONE SEEN NONE SEEN   Mucous PRESENT    Hyaline Casts, UA PRESENT   Digoxin level     Status: Abnormal   Collection Time: 09/14/15  9:58 PM  Result Value Ref Range   Digoxin Level 0.5 (L) 0.8 - 2.0 ng/mL  Magnesium     Status: None   Collection Time: 09/14/15  9:58 PM  Result Value Ref Range   Magnesium 1.9 1.7 - 2.4 mg/dL  Basic metabolic panel     Status: Abnormal   Collection Time: 09/15/15 10:21 AM  Result Value Ref Range   Sodium 130 (L) 135 - 145 mmol/L   Potassium 3.8 3.5 - 5.1 mmol/L   Chloride 104 101 - 111 mmol/L   CO2 17 (L) 22 - 32 mmol/L   Glucose, Bld 109 (H) 65 - 99 mg/dL   BUN 20 6 - 20 mg/dL   Creatinine, Ser 1.29 0.44 - 1.00 mg/dL   Calcium 8.5 (L) 8.9 - 10.3 mg/dL   GFR calc non Af Amer >60 >60 mL/min   GFR calc Af  Amer >60 >60 mL/min    Comment: (NOTE) The eGFR has been calculated using the CKD EPI equation. This calculation has not been validated in all clinical situations. eGFR's persistently <60 mL/min signify possible Chronic Kidney Disease.    Anion gap 9 5 - 15  CBC     Status: Abnormal   Collection Time: 09/15/15 10:21 AM  Result Value Ref Range   WBC 15.0 (H) 3.6 - 11.0 K/uL   RBC 4.49 3.80 - 5.20 MIL/uL   Hemoglobin 14.3 12.0 - 16.0 g/dL   HCT 06.4 61.4 - 01.2 %   MCV 93.7 80.0 - 100.0 fL   MCH 31.7 26.0 - 34.0 pg   MCHC 33.9 32.0 - 36.0 g/dL   RDW 03.5 81.3 - 62.5 %   Platelets 262 150 - 440 K/uL  Magnesium     Status: None   Collection Time: 09/15/15 10:21 AM  Result Value Ref Range   Magnesium 1.8 1.7 - 2.4 mg/dL  Glucose, capillary     Status: None   Collection Time: 09/15/15  4:13 PM  Result Value Ref Range   Glucose-Capillary 96 65 - 99 mg/dL    Current Facility-Administered Medications  Medication Dose Route Frequency Provider Last Rate Last Dose  . 0.9 %  sodium chloride infusion   Intravenous Continuous Robley Fries, MD 50 mL/hr at 09/15/15 0551 1,000 mL at 09/15/15 0551  . digoxin (LANOXIN) tablet 0.0625 mg  0.0625 mg Oral Q M,W,F Robley Fries, MD   0.0625  mg at 09/15/15 1332  . [START ON 09/16/2015] digoxin (LANOXIN) tablet 0.125 mg  0.125 mg Oral Q T,Th,S,Su Sylvan Cheese, MD      . dimenhyDRINATE (DRAMAMINE) tablet 50 mg  50 mg Oral Q8H PRN Sylvan Cheese, MD      . heparin injection 5,000 Units  5,000 Units Subcutaneous 3 times per day Sylvan Cheese, MD   5,000 Units at 09/15/15 1332  . hydrALAZINE (APRESOLINE) injection 10 mg  10 mg Intravenous Q4H PRN Sylvan Cheese, MD      . hydrALAZINE (APRESOLINE) tablet 25 mg  25 mg Oral 4 times per day Max Sane, MD   25 mg at 09/15/15 1802  . Levofloxacin (LEVAQUIN) IVPB 250 mg  250 mg Intravenous Q24H Max Sane, MD   250 mg at 09/15/15 1759  . mirtazapine (REMERON SOL-TAB) disintegrating tablet 7.5 mg  7.5 mg  Oral QHS Gonzella Lex, MD      . nitroGLYCERIN (NITROSTAT) SL tablet 0.4 mg  0.4 mg Sublingual Q5 min PRN Sylvan Cheese, MD      . polyvinyl alcohol (LIQUIFILM TEARS) 1.4 % ophthalmic solution 1 drop  1 drop Both Eyes BID Sylvan Cheese, MD   1 drop at 09/15/15 1000  . simethicone (MYLICON) chewable tablet 120 mg  120 mg Oral PRN Sylvan Cheese, MD      . sodium chloride (OCEAN) 0.65 % nasal spray 1 spray  1 spray Nasal PRN Sylvan Cheese, MD      . triamcinolone (NASACORT) nasal inhaler 2 spray  2 spray Nasal Daily PRN Sylvan Cheese, MD        Musculoskeletal: Strength & Muscle Tone: within normal limits Gait & Station: unsteady Patient leans: N/A  Psychiatric Specialty Exam: Review of Systems  Unable to perform ROS: mental status change    Blood pressure 177/77, pulse 89, temperature 98.3 F (36.8 C), temperature source Oral, resp. rate 18, height '5\' 2"'$  (1.575 m), weight 47.628 kg (105 lb), SpO2 98 %.Body mass index is 19.2 kg/(m^2).  General Appearance: Casual  Eye Contact::  Poor  Speech:  Slow  Volume:  Decreased  Mood:  Anxious  Affect:  Flat  Thought Process:  Circumstantial  Orientation:  Negative  Thought Content:  Thought content varied from moment to moment. At times she would insist that she could not remember anything at all and answered any questions at other times she was able to converse fairly articulately especially with her son.  Suicidal Thoughts:  No  Homicidal Thoughts:  No  Memory:  Immediate;   Negative Recent;   Negative Remote;   Negative  Judgement:  Impaired  Insight:  Shallow  Psychomotor Activity:  Decreased  Concentration:  Poor  Recall:  Poor  Fund of Knowledge:Fair  Language: Fair  Akathisia:  No  Handed:  Right  AIMS (if indicated):     Assets:  Financial Resources/Insurance Housing Physical Health Resilience Social Support  ADL's:  Impaired  Cognition: Impaired,  Mild  Sleep:      Treatment Plan Summary: Daily contact  with patient to assess and evaluate symptoms and progress in treatment, Medication management and Plan 82 year old woman with an unusual presentation. She had been described previously as having an encephalopathy or delirium however on my interview today she really doesn't fit in with atypical type of delirium. I would agree with the assessment of Dr. Doy Mince earlier that there seems to be a significant "behavioral" component. Patient appeared to be willfully refusing to engage in conversation although her  reason for this was unclear. She did acknowledge some nervousness. There does not seem to be however any secondary gain that she would receive from this. Her claiming of not hearing and not seeing could possibly be considered conversion symptoms although I'm not even certain how much she really believes in them or if she was just saying them. There is no past psychiatric history. I have a feeling that it's likely that this patient has some degree of depression. Several other thing she said seem to have a hopeless cast to them. Affect is anxious and withdrawn. She is refusing to eat almost anything. I am proposing starting a low dose of mirtazapine 7.5 mg at night which should help with her appetite and help her get a decent night sleep and if she tolerates it could possibly be increased up gradually to help treat depressive symptoms. Patient herself refused even listen to me articulate this. Follow-up daily.  Disposition: Patient does not meet criteria for psychiatric inpatient admission. Supportive therapy provided about ongoing stressors.  Alethia Berthold, MD 09/15/2015 7:40 PM

## 2015-09-15 NOTE — Clinical Social Work Note (Signed)
Clinical Social Work Assessment  Patient Details  Name: [name] MRN: [id number] Date of Birth: 1921/03/07  Date of referral:  09/15/15               Reason for consult:  Facility Placement                Permission sought to share information with:  Family Supports Permission granted to share information::  Yes, Verbal Permission Granted  Name::     Hubert Azure, daughter   Housing/Transportation Living arrangements for the past 2 months:  Single Family Home Source of Information:  Adult Children Patient Interpreter Needed:  None Criminal Activity/Legal Involvement Pertinent to Current Situation/Hospitalization:  No - Comment as needed Significant Relationships:  Adult Children Lives with:  Self Do you feel safe going back to the place where you live?  Yes Need for family participation in patient care:  Yes (Comment)  Care giving concerns:  No care giving concerns identified.   Social Worker assessment / plan:  CSW met with pt'sdaughter to address consult for SNF. CSW introduced herself and explained role of social work. CSW spoke with pt's daughter as pt is not oriented. Pt is a readmit, however did not have a 3 night inpatient qualifying stay during last admission. Pt's daughter shared that if pt clears from this UTI, she is able to return home. Per pt's daughter, pt would like to return home and is able to return to her home as she has Life Time Rights and "would like to die in her bed" PT has not evaluated pt during this admission, however the recommendation last admission was HHPT. Pt's daughter inquired about Medicaid and CSW provided information for [employer]. CSW will continue to follow pt and work up for SNF if appropriate.   Employment status:  Retired Forensic scientist:  Medicare PT Recommendations:  Not assessed at this time [place] / Referral to community resources:  Other (Comment Required)  Patient/Family's Response to care:  Pt's daughter was  appreciative of CSW support.   Patient/Family's Understanding of and Emotional Response to Diagnosis, Current Treatment, and Prognosis:  Pt's daughter is well aware of pt's condition and would like for pt to return home, if appropriate. Pt's daughter would be open to SNF placement if needed.   Emotional Assessment Appearance:  Appears stated age Attitude/Demeanor/Rapport:  Other (Appropriate) Affect (typically observed):  Pleasant Orientation:  Oriented to Self, Fluctuating Orientation (Suspected and/or reported Sundowners) Alcohol / Substance use:  Never Used Psych involvement (Current and /or in the community):  No (Comment)  Discharge Needs  Concerns to be addressed:  Adjustment to Illness Readmission within the last 30 days:  Yes Current discharge risk:  Chronically ill Barriers to Discharge:  Continued Medical Work up   [employer], LCSW 09/15/2015, 4:23 PM

## 2015-09-16 LAB — CBC
HCT: 34 % — ABNORMAL LOW (ref 35.0–47.0)
Hemoglobin: 11.8 g/dL — ABNORMAL LOW (ref 12.0–16.0)
MCH: 32.9 pg (ref 26.0–34.0)
MCHC: 34.6 g/dL (ref 32.0–36.0)
MCV: 94.9 fL (ref 80.0–100.0)
Platelets: 193 10*3/uL (ref 150–440)
RBC: 3.59 MIL/uL — AB (ref 3.80–5.20)
RDW: 13.1 % (ref 11.5–14.5)
WBC: 8.4 10*3/uL (ref 3.6–11.0)

## 2015-09-16 LAB — BASIC METABOLIC PANEL
Anion gap: 4 — ABNORMAL LOW (ref 5–15)
BUN: 20 mg/dL (ref 6–20)
CHLORIDE: 107 mmol/L (ref 101–111)
CO2: 19 mmol/L — ABNORMAL LOW (ref 22–32)
Calcium: 8.1 mg/dL — ABNORMAL LOW (ref 8.9–10.3)
Creatinine, Ser: 0.68 mg/dL (ref 0.44–1.00)
GFR calc Af Amer: >60 mL/min (ref >60)
GFR calc non Af Amer: >60 mL/min (ref >60)
GLUCOSE: 95 mg/dL (ref 65–99)
POTASSIUM: 3.3 mmol/L — AB (ref 3.5–5.1)
Sodium: 130 mmol/L — ABNORMAL LOW (ref 135–145)

## 2015-09-16 LAB — CULTURE, BLOOD (ROUTINE X 2)
CULTURE: NO GROWTH
CULTURE: NO GROWTH

## 2015-09-16 MED ORDER — ENSURE ENLIVE PO LIQD
237.0000 mL | Freq: Three times a day (TID) | ORAL | Status: DC
  Administered 2015-09-17 – 2015-09-18 (×3): 237 mL via ORAL

## 2015-09-16 MED ORDER — CETYLPYRIDINIUM CHLORIDE 0.05 % MT LIQD
7.0000 mL | Freq: Two times a day (BID) | OROMUCOSAL | Status: DC
  Administered 2015-09-16 – 2015-09-18 (×3): 7 mL via OROMUCOSAL

## 2015-09-16 MED ORDER — HYDRALAZINE HCL 25 MG PO TABS
50.0000 mg | ORAL_TABLET | Freq: Four times a day (QID) | ORAL | Status: DC
  Administered 2015-09-16 – 2015-09-18 (×7): 50 mg via ORAL
  Filled 2015-09-16 (×8): qty 2

## 2015-09-16 MED ORDER — ENSURE ENLIVE PO LIQD: 237.0000 mL | Freq: Two times a day (BID) | ORAL | Status: DC

## 2015-09-16 MED ORDER — POTASSIUM CHLORIDE CRYS ER 20 MEQ PO TBCR
20.0000 meq | EXTENDED_RELEASE_TABLET | Freq: Once | ORAL | Status: AC
Start: 2015-09-16 — End: 2015-09-16
  Administered 2015-09-16: 20 meq via ORAL
  Filled 2015-09-16: qty 1

## 2015-09-16 NOTE — Consult Note (Signed)
[facility] Face-to-Face Psychiatry Consult   Reason for Consult:  Consult for this 82 year old woman currently in the hospital with altered mental status for some assistance with diagnosis and treatment. Referring Physician:  Manuella Ghazi Patient Identification: [name] MRN:  [id number] Principal Diagnosis: Altered mental status Diagnosis:   Patient Active Problem List   Diagnosis Date Noted  . Pyuria - likely UTI [N39.0] 09/15/2015  . Acute encephalopathy [G93.40] 09/15/2015  . Hypokalemia [E87.6] 09/15/2015  . Hyponatremia [E87.1] 09/15/2015  . Early Sepsis  [A41.9] 09/15/2015  . Accelerated hypertension [I10] 09/15/2015  . Dysphagia [R13.10] 09/15/2015  . Depression [F32.9] 09/15/2015  . Altered mental status [R41.82] 09/11/2015  . Hypertensive urgency [I16.0] 09/11/2015  . Altered mental state [R41.82] 09/11/2015  . Delirium [R41.0] 09/11/2015  . Medication side effects [T88.7XXA]   . Atypical chest pain [R07.89]   . Palpitations [R00.2] 09/25/2012  . Labile blood pressure [R03.0] 09/15/2012  . ? Orthostatic hypotension [I95.1] 09/13/2012  . Gait instability [R26.81] 09/12/2012  . Headache(784.0) [R51] 09/12/2012  . Hypertension, accelerated [I10] 09/12/2012  . Nausea alone [R11.0] 06/28/2011  . Benign hypertensive heart disease without heart failure [I11.9] 12/14/2010  . Spinal stenosis of lumbar region [M48.06] 12/14/2010  . History of vertigo [Z86.69] 12/14/2010  . Hypercholesterolemia [E78.00] 12/14/2010    Total Time spent with patient: 30 minutes  Subjective:   [name] is a 82 y.o. female patient admitted with "I can't hear you".  Follow-up evaluation April 4. Patient was seen this morning. We spoke and I also got to speak with her daughter and son both in the presence of the patient and separately. Patient presented markedly different this morning. She was able to sustain eye contact and have a lucid conversation. No longer claimed to be unable to hear or see.  She was able to describe a feeling of anxiety and worry about taking care of herself at home. Denied feeling depressed. Says that she still did not sleep very well last night. Patient is denying any hallucinations denying any suicidal thoughts. She was alert and oriented to being in the hospital in the town. She was able to get the year correct but was wrong about the month and day. Able to name her relatives correctly. She was able to repeat 3 words immediately and got one out of 3 at about 2 minutes. Overall looked quite a bit better. Affect was brighter. When I discussed the idea of depression with her she denied feeling like she was depressed. Evidently she had refused to take the mirtazapine last night as well. Speaking with her daughter separately we were able to discuss the patient's anxiety and the family's anxiety about her continuing to live by herself. We're she is going to be able to go back to safe independent living at this time.  HPI:  Patient interviewed as best I could. Her son was present in the room and gave a significant amount of useful history. Chart reviewed labs reviewed. This 82 year old woman was brought into the hospital because her adult children had noticed that her mental status had started declining again over the last couple days. She was responding strangely, not eating well, seemed more confused, seemed very anxious. Patient had just been in the hospital last week with a similar presentation. That apparently had improved over a couple days and she was sent home in the middle of last week and by her son's report had a couple of decent days before things started to go downhill again. The  patient herself is either unable or unwilling to give me any history. She has a very strange presentation. She tells me multiple times that she can not hear me but she can respond appropriately to questions asked in a normal tone of voice by both myself and her son. Midway through the interview she  started saying that she was not able to see me or see her son either. Patient did acknowledge that she was feeling anxious and that I was making her upset by asking her so many questions. She was not able to identify any particular recent stress. Was not able really to go into much further detail and after a limited period of time started to insist that I should leave her alone and leave the room. Patient is not currently on any psychiatric medicine. She had been seen by neurology who do not believe that there has been a new neurologic development or any sign of seizures. She was seen to have a likely urinary tract infection which is being treated and has been proposed as a possible etiology.  Social history: By the son's report the patient had been living independently and taking care of herself and her dog until just a couple weeks ago when things started to decline. She has 2 adult children one of whom is a nurse here at our hospital. Her son whom I spoke to lives in [place] but has been up here helping out for a couple weeks. Patient is widowed. Son describes her as being a long-standing perfectionist but always mentally very sharp.  Substance abuse history: No history of alcohol or drug abuse.  Medical history: Multiple medical problems including hypertension history of atypical chest pain history of dysphasia history of hyponatremia pyuria.  Past Psychiatric History: By the report of the sun and everything I can see in the chart there is no past psychiatric history. As I noted the son described his mother as having always been a bit of a perfectionist and perhaps a little on the anxious side but had never been identified as a treatable problem. No history of hospitalization. No history of suicidal behavior or psychotic behavior. No known history of treatment with psychiatric medicine.  Risk to Self: Is patient at risk for suicide?: No Risk to Others:   Prior Inpatient Therapy:   Prior  Outpatient Therapy:    Past Medical History:  Past Medical History  Diagnosis Date  . Hypertension     a. labile HTN  . Atypical chest pain     a. nuclear stress test 04/2010: normal; b. echo 2009: EF 55-60%, mild LVH, impaired LV relaxation, mild aortic sclerosis, mild TR, nl PASP, since echo 2007 no significant change   . History of spinal stenosis     underwent back surgery on 12/03/09 by Dr. Glenna Fellows  . Hyperlipidemia   . Vertigo     positional vertigo  . Dizziness   . Muscle spasm   . Numbness in feet   . Dyspepsia   . Fatigue   . Headaches, cluster   . Joint pain   . Worsening vision   . Shortness of breath   . Heart palpitations   . Nausea and/or vomiting     with walking  . Hypercholesterolemia     with inability to tolerate statin therapy  . Mitral valve prolapse   . History of liver disease     history of liver trouble, has not had any known esposures to jaundiced person  .  History of rheumatic fever     as a child  . 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 Medication side effects     a. 30+ medication allergies and/or intolerances; b. she will not take daily antihypertensives  . Benign hypertensive heart disease without heart failure 12/14/2010    Past Surgical History  Procedure Laterality Date  . Tonsillectomy    . Breast biopsy    . Bladder fulguration    . Cholecystectomy    . Total abdominal hysterectomy    . Hemorrhoid surgery    . Cervical fusion    . Carpal tunnel release      bilaterally  . Lumbar laminectomy    . Trigger finger release      x2  . Tooth extraction     Family History:  Family History  Problem Relation Age of Onset  . Hypertension Father   . Heart attack Father   . Heart failure Mother    Family Psychiatric  History: There was a limited amount I can learn about this without the patient being cooperative. Was not able to identify from interview with the son and the chart whether there is any family history of  mental illness Social History:  History  Alcohol Use No     History  Drug Use No    Social History   Social History  . Marital Status: Widowed    Spouse Name: N/A  . Number of Children: N/A  . Years of Education: N/A   Social History Main Topics  . Smoking status: Never Smoker   . Smokeless tobacco: Never Used  . Alcohol Use: No  . Drug Use: No  . Sexual Activity: Not Asked   Other Topics Concern  . None   Social History Narrative   Additional Social History:    Allergies:   Allergies  Allergen Reactions  . Amlodipine Nausea Only  . Aspirin   . Bystolic [Nebivolol Hcl]     Vision problem  . Calan [Verapamil Hcl]     Weakness and staggering  . Cardura [Doxazosin Mesylate]   . Cephalosporins   . Clonidine Derivatives     Facial swelling   . Codeine   . Cozaar   . Doxazosin   . Elavil [Amitriptyline Hcl]   . Erythromycin   . Gantrisin [Sulfisoxazole]   . Hctz [Hydrochlorothiazide]   . Iodides   . Lopressor [Metoprolol Tartrate]   . Methyldopa     Facial swelling   . Micardis [Telmisartan]   . Morphine And Related   . Nizatidine   . Nsaids   . Pantoprazole Sodium   . Penicillins   . Prilosec [Omeprazole]   . Promethazine Hcl   . Streptomycin   . Sulfa Drugs Cross Reactors   . Tequin   . Tetracyclines & Related   . Toprol Xl [Metoprolol Succinate]   . Ultram [Tramadol]   . Valium   . Valturna [Aliskiren-Valsartan] Nausea And Vomiting    Labs:  Results for orders placed or performed during the hospital encounter of 09/14/15 (from the past 48 hour(s))  Comprehensive metabolic panel     Status: Abnormal   Collection Time: 09/14/15  9:58 PM  Result Value Ref Range   Sodium 131 (L) 135 - 145 mmol/L   Potassium 2.9 (LL) 3.5 - 5.1 mmol/L    Comment: CRITICAL RESULT CALLED TO, READ BACK BY AND VERIFIED WITH RACHEL HAYDEN AT 2322 09/14/15.PMH   Chloride 102 101 - 111 mmol/L  CO2 21 (L) 22 - 32 mmol/L   Glucose, Bld 118 (H) 65 - 99 mg/dL   BUN 21  (H) 6 - 20 mg/dL   Creatinine, Ser 0.62 0.44 - 1.00 mg/dL   Calcium 9.0 8.9 - 10.3 mg/dL   Total Protein 6.9 6.5 - 8.1 g/dL   Albumin 4.2 3.5 - 5.0 g/dL   AST 35 15 - 41 U/L   ALT 39 14 - 54 U/L   Alkaline Phosphatase 72 38 - 126 U/L   Total Bilirubin 0.9 0.3 - 1.2 mg/dL   GFR calc non Af Amer >60 >60 mL/min   GFR calc Af Amer >60 >60 mL/min    Comment: (NOTE) The eGFR has been calculated using the CKD EPI equation. This calculation has not been validated in all clinical situations. eGFR's persistently <60 mL/min signify possible Chronic Kidney Disease.    Anion gap 8 5 - 15  Ethanol (ETOH)     Status: None   Collection Time: 09/14/15  9:58 PM  Result Value Ref Range   Alcohol, Ethyl (B) <5 <5 mg/dL    Comment:        LOWEST DETECTABLE LIMIT FOR SERUM ALCOHOL IS 5 mg/dL FOR MEDICAL PURPOSES ONLY   Salicylate level     Status: None   Collection Time: 09/14/15  9:58 PM  Result Value Ref Range   Salicylate Lvl <9.3 2.8 - 30.0 mg/dL  Acetaminophen level     Status: Abnormal   Collection Time: 09/14/15  9:58 PM  Result Value Ref Range   Acetaminophen (Tylenol), Serum <10 (L) 10 - 30 ug/mL    Comment:        THERAPEUTIC CONCENTRATIONS VARY SIGNIFICANTLY. A RANGE OF 10-30 ug/mL MAY BE AN EFFECTIVE CONCENTRATION FOR MANY PATIENTS. HOWEVER, SOME ARE BEST TREATED AT CONCENTRATIONS OUTSIDE THIS RANGE. ACETAMINOPHEN CONCENTRATIONS >150 ug/mL AT 4 HOURS AFTER INGESTION AND >50 ug/mL AT 12 HOURS AFTER INGESTION ARE OFTEN ASSOCIATED WITH TOXIC REACTIONS.   CBC     Status: Abnormal   Collection Time: 09/14/15  9:58 PM  Result Value Ref Range   WBC 13.5 (H) 3.6 - 11.0 K/uL   RBC 4.59 3.80 - 5.20 MIL/uL   Hemoglobin 14.4 12.0 - 16.0 g/dL   HCT 42.4 35.0 - 47.0 %   MCV 92.3 80.0 - 100.0 fL   MCH 31.4 26.0 - 34.0 pg   MCHC 34.0 32.0 - 36.0 g/dL   RDW 12.8 11.5 - 14.5 %   Platelets 277 150 - 440 K/uL  Urine Drug Screen, Qualitative ([facility] only)     Status: None   Collection  Time: 09/14/15  9:58 PM  Result Value Ref Range   Tricyclic, Ur Screen NONE DETECTED NONE DETECTED   Amphetamines, Ur Screen NONE DETECTED NONE DETECTED   MDMA (Ecstasy)Ur Screen NONE DETECTED NONE DETECTED   Cocaine Metabolite,Ur [place] NONE DETECTED NONE DETECTED   Opiate, Ur Screen NONE DETECTED NONE DETECTED   Phencyclidine (PCP) Ur S NONE DETECTED NONE DETECTED   Cannabinoid 50 Ng, Ur [place] NONE DETECTED NONE DETECTED   Barbiturates, Ur Screen NONE DETECTED NONE DETECTED   Benzodiazepine, Ur Scrn NONE DETECTED NONE DETECTED   Methadone Scn, Ur NONE DETECTED NONE DETECTED    Comment: (NOTE) 716  Tricyclics, urine               Cutoff 1000 ng/mL 200  Amphetamines, urine             Cutoff 1000 ng/mL 300  MDMA (  Ecstasy), urine           Cutoff 500 ng/mL 400  Cocaine Metabolite, urine       Cutoff 300 ng/mL 500  Opiate, urine                   Cutoff 300 ng/mL 600  Phencyclidine (PCP), urine      Cutoff 25 ng/mL 700  Cannabinoid, urine              Cutoff 50 ng/mL 800  Barbiturates, urine             Cutoff 200 ng/mL 900  Benzodiazepine, urine           Cutoff 200 ng/mL 1000 Methadone, urine                Cutoff 300 ng/mL 1100 1200 The urine drug screen provides only a preliminary, unconfirmed 1300 analytical test result and should not be used for non-medical 1400 purposes. Clinical consideration and professional judgment should 1500 be applied to any positive drug screen result due to possible 1600 interfering substances. A more specific alternate chemical method 1700 must be used in order to obtain a confirmed analytical result.  1800 Gas chromato graphy / mass spectrometry (GC/MS) is the preferred 1900 confirmatory method.   TSH     Status: None   Collection Time: 09/14/15  9:58 PM  Result Value Ref Range   TSH 2.619 0.350 - 4.500 uIU/mL  Urinalysis complete, with microscopic ([facility] only)     Status: Abnormal   Collection Time: 09/14/15  9:58 PM  Result Value Ref Range   Color,  Urine YELLOW (A) YELLOW   APPearance HAZY (A) CLEAR   Glucose, UA NEGATIVE NEGATIVE mg/dL   Bilirubin Urine NEGATIVE NEGATIVE   Ketones, ur NEGATIVE NEGATIVE mg/dL   Specific Gravity, Urine 1.008 1.005 - 1.030   Hgb urine dipstick NEGATIVE NEGATIVE   pH 6.0 5.0 - 8.0   Protein, ur NEGATIVE NEGATIVE mg/dL   Nitrite POSITIVE (A) NEGATIVE   Leukocytes, UA 1+ (A) NEGATIVE   RBC / HPF 0-5 0 - 5 RBC/hpf   WBC, UA TOO NUMEROUS TO COUNT 0 - 5 WBC/hpf   Bacteria, UA FEW (A) NONE SEEN   Squamous Epithelial / LPF NONE SEEN NONE SEEN   Mucous PRESENT    Hyaline Casts, UA PRESENT   Urine culture     Status: None (Preliminary result)   Collection Time: 09/14/15  9:58 PM  Result Value Ref Range   Specimen Description URINE, RANDOM    Special Requests NONE    Culture      >=100,000 COLONIES/mL GRAM NEGATIVE RODS IDENTIFICATION AND SUSCEPTIBILITIES TO FOLLOW    Report Status PENDING   Digoxin level     Status: Abnormal   Collection Time: 09/14/15  9:58 PM  Result Value Ref Range   Digoxin Level 0.5 (L) 0.8 - 2.0 ng/mL  Magnesium     Status: None   Collection Time: 09/14/15  9:58 PM  Result Value Ref Range   Magnesium 1.9 1.7 - 2.4 mg/dL  Basic metabolic panel     Status: Abnormal   Collection Time: 09/15/15 10:21 AM  Result Value Ref Range   Sodium 130 (L) 135 - 145 mmol/L   Potassium 3.8 3.5 - 5.1 mmol/L   Chloride 104 101 - 111 mmol/L   CO2 17 (L) 22 - 32 mmol/L   Glucose, Bld 109 (H) 65 - 99 mg/dL   BUN 20  6 - 20 mg/dL   Creatinine, Ser 0.71 0.44 - 1.00 mg/dL   Calcium 8.5 (L) 8.9 - 10.3 mg/dL   GFR calc non Af Amer >60 >60 mL/min   GFR calc Af Amer >60 >60 mL/min    Comment: (NOTE) The eGFR has been calculated using the CKD EPI equation. This calculation has not been validated in all clinical situations. eGFR's persistently <60 mL/min signify possible Chronic Kidney Disease.    Anion gap 9 5 - 15  CBC     Status: Abnormal   Collection Time: 09/15/15 10:21 AM  Result Value  Ref Range   WBC 15.0 (H) 3.6 - 11.0 K/uL   RBC 4.49 3.80 - 5.20 MIL/uL   Hemoglobin 14.3 12.0 - 16.0 g/dL   HCT 42.1 35.0 - 47.0 %   MCV 93.7 80.0 - 100.0 fL   MCH 31.7 26.0 - 34.0 pg   MCHC 33.9 32.0 - 36.0 g/dL   RDW 12.9 11.5 - 14.5 %   Platelets 262 150 - 440 K/uL  Magnesium     Status: None   Collection Time: 09/15/15 10:21 AM  Result Value Ref Range   Magnesium 1.8 1.7 - 2.4 mg/dL  Glucose, capillary     Status: None   Collection Time: 09/15/15  4:13 PM  Result Value Ref Range   Glucose-Capillary 96 65 - 99 mg/dL  CBC     Status: Abnormal   Collection Time: 09/16/15  7:10 AM  Result Value Ref Range   WBC 8.4 3.6 - 11.0 K/uL   RBC 3.59 (L) 3.80 - 5.20 MIL/uL   Hemoglobin 11.8 (L) 12.0 - 16.0 g/dL   HCT 34.0 (L) 35.0 - 47.0 %   MCV 94.9 80.0 - 100.0 fL   MCH 32.9 26.0 - 34.0 pg   MCHC 34.6 32.0 - 36.0 g/dL   RDW 13.1 11.5 - 14.5 %   Platelets 193 150 - 440 K/uL  Basic metabolic panel     Status: Abnormal   Collection Time: 09/16/15  7:10 AM  Result Value Ref Range   Sodium 130 (L) 135 - 145 mmol/L   Potassium 3.3 (L) 3.5 - 5.1 mmol/L   Chloride 107 101 - 111 mmol/L   CO2 19 (L) 22 - 32 mmol/L   Glucose, Bld 95 65 - 99 mg/dL   BUN 20 6 - 20 mg/dL   Creatinine, Ser 0.68 0.44 - 1.00 mg/dL   Calcium 8.1 (L) 8.9 - 10.3 mg/dL   GFR calc non Af Amer >60 >60 mL/min   GFR calc Af Amer >60 >60 mL/min    Comment: (NOTE) The eGFR has been calculated using the CKD EPI equation. This calculation has not been validated in all clinical situations. eGFR's persistently <60 mL/min signify possible Chronic Kidney Disease.    Anion gap 4 (L) 5 - 15    Current Facility-Administered Medications  Medication Dose Route Frequency Provider Last Rate Last Dose  . antiseptic oral rinse (CPC / CETYLPYRIDINIUM CHLORIDE 0.05%) solution 7 mL  7 mL Mouth Rinse BID Vipul Shah, MD      . digoxin (LANOXIN) tablet 0.0625 mg  0.0625 mg Oral Q M,W,F Sylvan Cheese, MD   0.0625 mg at 09/15/15 1332   . digoxin (LANOXIN) tablet 0.125 mg  0.125 mg Oral Q T,Th,S,Su Abdullahi Oseni, MD   0.125 mg at 09/16/15 0945  . dimenhyDRINATE (DRAMAMINE) tablet 50 mg  50 mg Oral Q8H PRN Sylvan Cheese, MD      . feeding  supplement (ENSURE ENLIVE) (ENSURE ENLIVE) liquid 237 mL  237 mL Oral BID BM Vipul Shah, MD      . heparin injection 5,000 Units  5,000 Units Subcutaneous 3 times per day Sylvan Cheese, MD   5,000 Units at 09/16/15 1358  . hydrALAZINE (APRESOLINE) injection 10 mg  10 mg Intravenous Q4H PRN Sylvan Cheese, MD      . hydrALAZINE (APRESOLINE) tablet 25 mg  25 mg Oral 4 times per day Max Sane, MD   25 mg at 09/16/15 1359  . Levofloxacin (LEVAQUIN) IVPB 250 mg  250 mg Intravenous Q24H Max Sane, MD   250 mg at 09/15/15 1759  . nitroGLYCERIN (NITROSTAT) SL tablet 0.4 mg  0.4 mg Sublingual Q5 min PRN Sylvan Cheese, MD      . polyvinyl alcohol (LIQUIFILM TEARS) 1.4 % ophthalmic solution 1 drop  1 drop Both Eyes BID Sylvan Cheese, MD   1 drop at 09/15/15 1000  . potassium chloride SA (K-DUR,KLOR-CON) CR tablet 20 mEq  20 mEq Oral Once Max Sane, MD      . simethicone (MYLICON) chewable tablet 120 mg  120 mg Oral PRN Sylvan Cheese, MD      . sodium chloride (OCEAN) 0.65 % nasal spray 1 spray  1 spray Nasal PRN Sylvan Cheese, MD      . triamcinolone (NASACORT) nasal inhaler 2 spray  2 spray Nasal Daily PRN Sylvan Cheese, MD        Musculoskeletal: Strength & Muscle Tone: within normal limits Gait & Station: unsteady Patient leans: N/A  Psychiatric Specialty Exam: Review of Systems  HENT: Negative.   Eyes: Negative.   Respiratory: Negative.   Cardiovascular: Negative.   Gastrointestinal: Negative.   Musculoskeletal: Negative.   Skin: Negative.   Neurological: Positive for weakness.  Psychiatric/Behavioral: Positive for memory loss. Negative for depression, suicidal ideas, hallucinations and substance abuse. The patient is nervous/anxious and has insomnia.     Blood  pressure 168/84, pulse 81, temperature 98.7 F (37.1 C), temperature source Oral, resp. rate 16, height '5\' 2"'$  (1.575 m), weight 47.628 kg (105 lb), SpO2 97 %.Body mass index is 19.2 kg/(m^2).  General Appearance: Casual  Eye Contact::  Good  Speech:  Slow  Volume:  Decreased  Mood:  Anxious  Affect:  Appropriate  Thought Process:  Circumstantial  Orientation:  Negative  Thought Content:  Negative  Suicidal Thoughts:  No  Homicidal Thoughts:  No  Memory:  Immediate;   Negative Recent;   Negative Remote;   Negative  Judgement:  Fair  Insight:  Fair  Psychomotor Activity:  Decreased  Concentration:  Fair  Recall:  Poor  Fund of Knowledge:Fair  Language: Fair  Akathisia:  No  Handed:  Right  AIMS (if indicated):     Assets:  Catering manager Housing Physical Health Resilience Social Support  ADL's:  Impaired  Cognition: Impaired,  Mild  Sleep:      Treatment Plan Summary: Daily contact with patient to assess and evaluate symptoms and progress in treatment, Medication management and Plan Patient was quite a bit better this morning. This lends support to the. That most of the bizarre mental state last night was related to her urinary tract infection which is certainly the patient's.. Family continue to be concerned that she has a pattern of sundowning in the evening and of being more lucid in the morning. They are concerned about a safety plan for her in the future and feel that she may have unrealistic expectations for home  care. Nevertheless at this point there seems to be less reason to suspect depression. I will discontinue the mirtazapine since she did not take it anyway. The patient's request. I am not going to start any new medicine. I will continue to follow-up. Hopefully there will be the opportunity for her to go to rehabilitation to try and regain better strength and function so that she can have a chance at independent living. No further change to treatment plan  today. Reviewed plan with patient and family.  Disposition: Patient does not meet criteria for psychiatric inpatient admission. Supportive therapy provided about ongoing stressors.  Alethia Berthold, MD 09/16/2015 4:23 PM

## 2015-09-16 NOTE — Clinical Social Work Note (Signed)
CSW spoke with pt's daughter, who would like to consider STR at SNF. PT eval has been ordered. CSW explained the process of discharging to SNF. CSW also spoke with MD. CSW will initiate SNF search. CSW will continue to follow.   Darden Dates, MSW, LCSW Clinical Social Worker [phone]

## 2015-09-16 NOTE — Progress Notes (Signed)
Initial Nutrition Assessment   INTERVENTION:   -Cater to pt preferences -Recommend Ensure Enlive po BID, each supplement provides 350 kcal and 20 grams of protein. Pt would like to be send as afternoon snack mixed with ice cream. Pt asking for strawberry flavor over vanilla ice cream. Will send as AM snack as well.    NUTRITION DIAGNOSIS:   Inadequate oral intake related to poor appetite as evidenced by per patient/family report.  GOAL:   Patient will meet greater than or equal to 90% of their needs  MONITOR:   PO intake, Supplement acceptance, Labs, Weight trends, I & O's, Skin  REASON FOR ASSESSMENT:   Consult, Malnutrition Screening Tool Assessment of nutrition requirement/status  ASSESSMENT:   Pt admitted with AMS, Early sepsis secondary to likely UTI and hyponatremia, hypokalemia. Pt with recent admission 3 days PTA. Psych consulted.  Past Medical History  Diagnosis Date  . Hypertension     a. labile HTN  . Atypical chest pain     a. nuclear stress test 04/2010: normal; b. echo 2009: EF 55-60%, mild LVH, impaired LV relaxation, mild aortic sclerosis, mild TR, nl PASP, since echo 2007 no significant change   . History of spinal stenosis     underwent back surgery on 12/03/09 by Dr. Glenna Fellows  . Hyperlipidemia   . Vertigo     positional vertigo  . Dizziness   . Muscle spasm   . Numbness in feet   . Dyspepsia   . Fatigue   . Headaches, cluster   . Joint pain   . Worsening vision   . Shortness of breath   . Heart palpitations   . Nausea and/or vomiting     with walking  . Hypercholesterolemia     with inability to tolerate statin therapy  . Mitral valve prolapse   . History of liver disease     history of liver trouble, has not had any known esposures to jaundiced person  . History of rheumatic fever     as a child  . 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 Medication side effects     a. 30+ medication allergies and/or intolerances; b. she  will not take daily antihypertensives  . Benign hypertensive heart disease without heart failure 12/14/2010    Diet Order:  DIET SOFT Room service appropriate?: Yes; Fluid consistency:: Thin   Pt reports eating some of her vegetable soup for lunch today. Pt reports her appetite 'has hit rock bottom.' Pt reports not having an appetite ' for a while.' RD notes in MD note that pt has been refusing to eat PTA. Per consult and per Nsg staff daughter reports pt does like chocolate Ensure.  Medications: reviewed, noted Remeron at bedtime  Electrolyte/Renal Profile and Glucose Profile:   Recent Labs Lab 09/14/15 2158 09/15/15 1021 09/16/15 0710  NA 131* 130* 130*  K 2.9* 3.8 3.3*  CL 102 104 107  CO2 21* 17* 19*  BUN 21* 20 20  CREATININE 0.62 0.71 0.68  CALCIUM 9.0 8.5* 8.1*  MG 1.9 1.8  --   GLUCOSE 118* 109* 95   Gastrointestinal Profile: Last BM:  09/12/2015, pt reports to RD drinking a lot of orange juice to promote BM this am   Unable to complete Nutrition-Focused physical exam at this time.  Pt declined on visit this afternoon as she has been so tired, from not sleeping much. Will attempt on follow-up.    Weight Change: Pt reports getting her weight  up to 115lbs recently. Per [facility] weight trends, pt last admission a few days ago was 107lbs, now 105lbs. Last recorded weight of 115lbs was October 2016.   Skin:  Reviewed, no issues   Height:   Ht Readings from Last 1 Encounters:  09/15/15 5\' 2"  (1.575 m)    Weight:   Wt Readings from Last 1 Encounters:  09/15/15 105 lb (47.628 kg)    BMI:  Body mass index is 19.2 kg/(m^2).  Estimated Nutritional Needs:   Kcal:  BEE: 840kcals, TEE: (IF 1.2-1.4)(AF 1.2) 1200-1410kcals  Protein:  47-57g protein (1.0-1.2g/kg)  Fluid:  1.4-1.7L fluid (30-85mL/kg)  EDUCATION NEEDS:   No education needs identified at this time  Dwyane Luo, RD, LDN Pager [phone] Weekend/On-Call Pager [phone]

## 2015-09-16 NOTE — Evaluation (Signed)
Clinical/Bedside Swallow Evaluation Patient Details  Name: [name] MRN: [id number] Date of Birth: [birth year]  Today's Date: 09/16/2015 Time: SLP Start Time (ACUTE ONLY): 0900 SLP Stop Time (ACUTE ONLY): 1000 SLP Time Calculation (min) (ACUTE ONLY): 60 min  Past Medical History:  Past Medical History  Diagnosis Date  . Hypertension     a. labile HTN  . Atypical chest pain     a. nuclear stress test 04/2010: normal; b. echo 2009: EF 55-60%, mild LVH, impaired LV relaxation, mild aortic sclerosis, mild TR, nl PASP, since echo 2007 no significant change   . History of spinal stenosis     underwent back surgery on 12/03/09 by Dr. Glenna Fellows  . Hyperlipidemia   . Vertigo     positional vertigo  . Dizziness   . Muscle spasm   . Numbness in feet   . Dyspepsia   . Fatigue   . Headaches, cluster   . Joint pain   . Worsening vision   . Shortness of breath   . Heart palpitations   . Nausea and/or vomiting     with walking  . Hypercholesterolemia     with inability to tolerate statin therapy  . Mitral valve prolapse   . History of liver disease     history of liver trouble, has not had any known esposures to jaundiced person  . History of rheumatic fever     as a child  . 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 Medication side effects     a. 30+ medication allergies and/or intolerances; b. she will not take daily antihypertensives  . Benign hypertensive heart disease without heart failure 12/14/2010   Past Surgical History:  Past Surgical History  Procedure Laterality Date  . Tonsillectomy    . Breast biopsy    . Bladder fulguration    . Cholecystectomy    . Total abdominal hysterectomy    . Hemorrhoid surgery    . Cervical fusion    . Carpal tunnel release      bilaterally  . Lumbar laminectomy    . Trigger finger release      x2  . Tooth extraction     HPI:  Pt is a 82 y.o. female with a known history of Rheumatic heart disease, spinal stenosis,  vertigo, N/V, hypertension, declining vision, fatigue, allergies to multiple medications, and multiple other medical issues and a recent admission for altered mental status with a stroke and chest pain workup - No acute finding or change from 2014. Unremarkable brain MRI. Neurology evaluated patient at that time and suggested treating any possible infection but there was no recommendation for seizure treatment as patient was not thought to have had a seizure. Patient was unable to provide history due to unwillingness to talk to me directly but will answer questions through son and they said bedside who is "reading her lips" to translate and remains mute to other people. After recent discharge, patient did well on day of discharge and all day yesterday but at nighttime, patient was unable to focus. She is reported to have had a fear of being confused in the middle of the night and requested that daughter rearrange her bedroom so that she does not bump into items and hurt herself in the middle of the night. Patient was also reported to have begun to feel weak. All day today, patient did not eat and has been becoming increasingly weaker and more confused. She has been spelling out  her words instead of pronouncing them and was unable to go to the bathroom unaided. This is unusual for her as she lives alone and functions by herself. Patient does not complain of any abdominal pain, nausea, vomiting, fall, syncope or shortness of breath but daughter (who is a Marine scientist) states that patient has had significantly decreased urine output all day and urine was foul-smelling and dark. She has had not eaten all day. Pt is currently alert to self and family and verbally conversive. She is able to answer questions but noted speech/conversation is slightly tangential and she often repeats information. She perseverates on certain topics as well. Pt feeding self breakfast meal upo entering room. Pt does endorse s/s of Esophageal  dysmotility w/ po's "sometimes".    Assessment / Plan / Recommendation Clinical Impression  Pt appears at reduced risk for aspiration w/ po liquids and purees(no reported mastication deficits w/ broken down mech soft foods per pt/family) following general aspiration precautions. No immediate, overt s/s of aspiration were noted w/ trials taken during session; no decline in respiratory presentation overall. Of note, pt exhibited a delayed throat clearing post po trials toward end of session and stated she was feeling "full". Oral phase appeared wfl w/ all trial consistencies taken; pt does have increased mastication challenges w/ increased textured foods, esp. meats, sec. to missing some dentition - pt described it was "hard to chew it". Pt fed self w/ setup assistance. Dicussed a recent choking event when regurgitation occured while eating and the potential risk for aspiration of the Reflux material. Discussed swallowing behaviors and ways to modify behaviors and add in general aspiration precautions in order to lessen risk for aspiration during swallowing; discussed Esopahgeal dysmotility, its impact on overall intake, and food prep and options including supplement (Drink form) for nutritional support. Rec. GI consult to better assess Esophageal motility and discuss any possible tx options or information on living w/ dysmotility (if indicated). Rec. pills w/ a puree such as applesauce or Yogurt for safer swallowing. Pt and Dtr/Son agreed.     Aspiration Risk  Mild aspiration risk (from impact of Esophageal dysmotility)    Diet Recommendation  Dys. 3(mech soft) w/ moistened foods; general aspiration precautions; Reflux precautions  Medication Administration: Whole meds with puree    Other  Recommendations Recommended Consults: Consider GI evaluation;Consider esophageal assessment (Dietician) Oral Care Recommendations: Oral care BID;Staff/trained caregiver to provide oral care   Follow up  Recommendations  None    Frequency and Duration            Prognosis Prognosis for Safe Diet Advancement: Fair (-Good) Barriers to Reach Goals: Cognitive deficits (TBD)      Swallow Study   General Date of Onset: 09/14/15 (although pt had similar c/o Esophageal dysmotility last admi) HPI: Pt is a 82 y.o. female with a known history of Rheumatic heart disease, spinal stenosis, vertigo, N/V, hypertension, declining vision, fatigue, allergies to multiple medications, and multiple other medical issues and a recent admission for altered mental status with a stroke and chest pain workup - No acute finding or change from 2014. Unremarkable brain MRI. Neurology evaluated patient at that time and suggested treating any possible infection but there was no recommendation for seizure treatment as patient was not thought to have had a seizure. Patient was unable to provide history due to unwillingness to talk to me directly but will answer questions through son and they said bedside who is "reading her lips" to translate and remains mute to other  people. After recent discharge, patient did well on day of discharge and all day yesterday but at nighttime, patient was unable to focus. She is reported to have had a fear of being confused in the middle of the night and requested that daughter rearrange her bedroom so that she does not bump into items and hurt herself in the middle of the night. Patient was also reported to have begun to feel weak. All day today, patient did not eat and has been becoming increasingly weaker and more confused. She has been spelling out her words instead of pronouncing them and was unable to go to the bathroom unaided. This is unusual for her as she lives alone and functions by herself. Patient does not complain of any abdominal pain, nausea, vomiting, fall, syncope or shortness of breath but daughter (who is a Marine scientist) states that patient has had significantly decreased urine output all  day and urine was foul-smelling and dark. She has had not eaten all day. Pt is currently alert to self and family and verbally conversive. She is able to answer questions but noted speech/conversation is slightly tangential and she often repeats information. She perseverates on certain topics as well. Pt feeding self breakfast meal upo entering room. Pt does endorse s/s of Esophageal dysmotility w/ po's "sometimes".  Type of Study: Bedside Swallow Evaluation Previous Swallow Assessment: no assessment Diet Prior to this Study: Regular;Thin liquids (meats cut small d/t lacking sufficient dentition) Temperature Spikes Noted: No (wbc initially elevated now 8.4) Respiratory Status: Room air History of Recent Intubation: No Behavior/Cognition: Alert;Cooperative;Pleasant mood;Confused;Distractible;Requires cueing Oral Cavity Assessment: Within Functional Limits (missing dentition) Oral Care Completed by SLP: Recent completion by staff Oral Cavity - Dentition: Missing dentition Vision: Functional for self-feeding Self-Feeding Abilities: Able to feed self;Needs assist;Needs set up Patient Positioning: Upright in bed Baseline Vocal Quality: Normal;Low vocal intensity Volitional Cough: Strong Volitional Swallow: Able to elicit    Oral/Motor/Sensory Function Overall Oral Motor/Sensory Function: Within functional limits   Ice Chips Ice chips: Not tested   Thin Liquid Thin Liquid: Within functional limits Presentation: Cup;Self Fed (7-8 trials w/ meal)    Nectar Thick Nectar Thick Liquid: Not tested   Honey Thick Honey Thick Liquid: Not tested   Puree Puree: Within functional limits Presentation: Self Fed;Spoon (~3 ozs )   Solid   GO   Solid:  (not seen d/t pt not wanting the po's)       Orinda Kenner, MS, CCC-SLP  Watson,Katherine 09/16/2015,4:05 PM

## 2015-09-16 NOTE — Progress Notes (Signed)
[facility] NAME: [name]    MR#:  [id number]  DATE OF BIRTH:  12-Nov-1920  SUBJECTIVE:  CHIEF COMPLAINT:   Chief Complaint  Patient presents with  . Altered Mental Status  didn't sleep very well last night and was very confused per daughter. REVIEW OF SYSTEMS:  Review of Systems  Constitutional: Negative for fever, weight loss, malaise/fatigue and diaphoresis.  HENT: Negative for ear discharge, ear pain, hearing loss, nosebleeds, sore throat and tinnitus.   Eyes: Negative for blurred vision and pain.  Respiratory: Negative for cough, hemoptysis, shortness of breath and wheezing.   Cardiovascular: Negative for chest pain, palpitations, orthopnea and leg swelling.  Gastrointestinal: Negative for heartburn, nausea, vomiting, abdominal pain, diarrhea, constipation and blood in stool.  Genitourinary: Negative for dysuria, urgency and frequency.       Bladder spasm  Musculoskeletal: Negative for myalgias and back pain.  Skin: Negative for itching and rash.  Neurological: Negative for dizziness, tingling, tremors, focal weakness, seizures, weakness and headaches.  Psychiatric/Behavioral: Positive for hallucinations. Negative for depression. The patient is not nervous/anxious.    DRUG ALLERGIES:   Allergies  Allergen Reactions  . Amlodipine Nausea Only  . Aspirin   . Bystolic [Nebivolol Hcl]     Vision problem  . Calan [Verapamil Hcl]     Weakness and staggering  . Cardura [Doxazosin Mesylate]   . Cephalosporins   . Clonidine Derivatives     Facial swelling   . Codeine   . Cozaar   . Doxazosin   . Elavil [Amitriptyline Hcl]   . Erythromycin   . Gantrisin [Sulfisoxazole]   . Hctz [Hydrochlorothiazide]   . Iodides   . Lopressor [Metoprolol Tartrate]   . Methyldopa     Facial swelling   . Micardis [Telmisartan]   . Morphine And Related   . Nizatidine   . Nsaids   . Pantoprazole Sodium   . Penicillins    . Prilosec [Omeprazole]   . Promethazine Hcl   . Streptomycin   . Sulfa Drugs Cross Reactors   . Tequin   . Tetracyclines & Related   . Toprol Xl [Metoprolol Succinate]   . Ultram [Tramadol]   . Valium   . Valturna [Aliskiren-Valsartan] Nausea And Vomiting   VITALS:  Blood pressure 164/69, pulse 78, temperature 98 F (36.7 C), temperature source Oral, resp. rate 18, height 5\' 2"  (1.575 m), weight 47.628 kg (105 lb), SpO2 97 %. PHYSICAL EXAMINATION:  Physical Exam  Constitutional: She is oriented to person, place, and time and well-developed, well-nourished, and in no distress.  HENT:  Head: Normocephalic and atraumatic.  Eyes: Conjunctivae and EOM are normal. Pupils are equal, round, and reactive to light.  Neck: Normal range of motion. Neck supple. No tracheal deviation present. No thyromegaly present.  Cardiovascular: Normal rate, regular rhythm and normal heart sounds.   Pulmonary/Chest: Effort normal and breath sounds normal. No respiratory distress. She has no wheezes. She exhibits no tenderness.  Abdominal: Soft. Bowel sounds are normal. She exhibits no distension. There is no tenderness.  Musculoskeletal: Normal range of motion.  Neurological: She is alert and oriented to person, place, and time. No cranial nerve deficit.  Skin: Skin is warm and dry. No rash noted.  Psychiatric: Mood and affect normal.   LABORATORY PANEL:   CBC  Recent Labs Lab 09/16/15 0710  WBC 8.4  HGB 11.8*  HCT 34.0*  PLT 193   ------------------------------------------------------------------------------------------------------------------ Chemistries   Recent  Labs Lab 09/14/15 2158 09/15/15 1021 09/16/15 0710  NA 131* 130* 130*  K 2.9* 3.8 3.3*  CL 102 104 107  CO2 21* 17* 19*  GLUCOSE 118* 109* 95  BUN 21* 20 20  CREATININE 0.62 0.71 0.68  CALCIUM 9.0 8.5* 8.1*  MG 1.9 1.8  --   AST 35  --   --   ALT 39  --   --   ALKPHOS 72  --   --   BILITOT 0.9  --   --    RADIOLOGY:   No results found. ASSESSMENT AND PLAN:  82 y.o. female with a known history of Rheumatic heart disease, spinal stenosis, vertigo, hypertension and a recent admission for altered mental status with neg stroke and chest pain workup. Patient is unable to provide history due to unwillingness to talk to me directly but will answer questions through son and they said bedside who is "reading her lips" to translate and remains mute to other people. History is mostly obtained from son and daughter at bedside. Patient was discharged from the hospital 3 days ago. After discharge, patient did well on day of discharge and all day yesterday but at nighttime, patient was unable to focus.  1). Early sepsis secondary to likely UTI with pyuria - Present on admission, now resolved - due to possible UTI as urinalysis showed positive leukocytes, nitrites and few bacteria. - Urine cultures growing GNR  2). Acute encephalopathy - Likely infection related and treat UTI with levofloxacin. During the last admission 3 days ago, neurology examined the patient and recommendation was to treat possible infection.  - Patient's encephalopathy is unusual as she is very functional per her daughter. She is normally alert and oriented 3.  - Appreciate psychiatry input - pt/family not interested in any new meds so held off mirapex - No imaging is indicated at this time due to recent extensive workup  3). Accelerated hypertension -  She takes hydralazine 25 mg QID at home, will increase dose as BP running high. - Monitor blood pressure closely  4). Hypokalemia/hyponatremia - Due to poor PO intake, may be contributing to the generalized weakness. - Replete and Recheck  5). Dysphagia - likely due to patient's screaming during the last admission (3 days ago). Stroke workup was negative.  - Could be due to motility disorder but pt not interested in any further w/up - doubt she would want any endoscopy - d/w family and they r in  agreement - started Ensure  6). Acute Delirium:  - Monitor, avoid benzo's   Family interested in placement - shared with CM/CSW, PT c/s placed,   All the records are reviewed and case discussed with Care Management/Social Worker. Management plans discussed with the patient, family and they are in agreement.  CODE STATUS: DNR  TOTAL TIME TAKING CARE OF THIS PATIENT: 35 minutes.   More than 50% of the time was spent in counseling/coordination of care: YES  POSSIBLE D/C IN 1-2 DAYS, DEPENDING ON CLINICAL CONDITION.   [facility], Tarvares Lant M.D on 09/16/2015 at 7:21 PM  Between 7am to 6pm - Pager - [phone]  After 6pm go to [email or web address] - password [email or web address] [facility]  Office  [phone]  CC: Primary care physician; Dicky Doe, MD  Note: This dictation was prepared with Dragon dictation along with smaller phrase technology. Any transcriptional errors that result from this process are unintentional.

## 2015-09-17 LAB — URINE CULTURE

## 2015-09-17 MED ORDER — POTASSIUM CHLORIDE CRYS ER 20 MEQ PO TBCR
40.0000 meq | EXTENDED_RELEASE_TABLET | Freq: Once | ORAL | Status: AC
  Administered 2015-09-17: 12:00:00 40 meq via ORAL
  Filled 2015-09-17: qty 2

## 2015-09-17 MED ORDER — POLYETHYLENE GLYCOL 3350 17 G PO PACK
17.0000 g | PACK | Freq: Every day | ORAL | Status: DC
  Administered 2015-09-17 – 2015-09-18 (×2): 17 g via ORAL
  Filled 2015-09-17 (×2): qty 1

## 2015-09-17 MED ORDER — POTASSIUM CHLORIDE CRYS ER 20 MEQ PO TBCR
40.0000 meq | EXTENDED_RELEASE_TABLET | Freq: Once | ORAL | Status: AC
  Administered 2015-09-17: 40 meq via ORAL
  Filled 2015-09-17: qty 2

## 2015-09-17 MED ORDER — SENNA 8.6 MG PO TABS
2.0000 | ORAL_TABLET | Freq: Two times a day (BID) | ORAL | Status: DC
  Administered 2015-09-17 – 2015-09-18 (×2): 17.2 mg via ORAL
  Filled 2015-09-17 (×3): qty 2

## 2015-09-17 MED ORDER — CIPROFLOXACIN HCL 500 MG PO TABS
250.0000 mg | ORAL_TABLET | Freq: Two times a day (BID) | ORAL | Status: DC
  Administered 2015-09-17 – 2015-09-18 (×2): 250 mg via ORAL
  Filled 2015-09-17: qty 0.5
  Filled 2015-09-17: qty 1
  Filled 2015-09-17: qty 0.5

## 2015-09-17 NOTE — NC FL2 (Signed)
[place] LEVEL OF CARE SCREENING TOOL     IDENTIFICATION  Patient Name: ANEITA ARBELO Birthdate: 1920-09-18 Sex: female Admission Date (Current Location): 09/14/2015  [facility] and [place] Number:  Engineering geologist and Address:  [facility], [address] [place], [place] [address]      Provider Number: [id number]  Attending Physician Name and Address:  Max Sane, MD  Relative Name and Phone Number:       Current Level of Care: Hospital Recommended Level of Care: [place] Prior Approval Number:    Date Approved/Denied:   PASRR Number:    Discharge Plan: SNF    Current Diagnoses: Patient Active Problem List   Diagnosis Date Noted  . Pyuria - likely UTI 09/15/2015  . Acute encephalopathy 09/15/2015  . Hypokalemia 09/15/2015  . Hyponatremia 09/15/2015  . Early Sepsis  09/15/2015  . Accelerated hypertension 09/15/2015  . Dysphagia 09/15/2015  . Depression 09/15/2015  . Altered mental status 09/11/2015  . Hypertensive urgency 09/11/2015  . Altered mental state 09/11/2015  . Delirium 09/11/2015  . Medication side effects   . Atypical chest pain   . Palpitations 09/25/2012  . Labile blood pressure 09/15/2012  . ? Orthostatic hypotension 09/13/2012  . Gait instability 09/12/2012  . Headache(784.0) 09/12/2012  . Hypertension, accelerated 09/12/2012  . Nausea alone 06/28/2011  . Benign hypertensive heart disease without heart failure 12/14/2010  . Spinal stenosis of lumbar region 12/14/2010  . History of vertigo 12/14/2010  . Hypercholesterolemia 12/14/2010    Orientation RESPIRATION BLADDER Height & Weight     Self, Time, Situation, Place  Normal Continent Weight: 104 lb 9.6 oz (47.446 kg) Height:  5\' 2"  (157.5 cm)  BEHAVIORAL SYMPTOMS/MOOD NEUROLOGICAL BOWEL NUTRITION STATUS      Continent Diet (Dys. 3(mech soft) w/ moistened foods; general aspiration precautions; Reflux precautions)  AMBULATORY  STATUS COMMUNICATION OF NEEDS Skin   Limited Assist Verbally Normal                       Personal Care Assistance Level of Assistance  Bathing, Feeding, Dressing Bathing Assistance: Limited assistance Feeding assistance: Independent Dressing Assistance: Limited assistance     Functional Limitations Info  Sight, Hearing, Speech Sight Info: Adequate Hearing Info: Adequate Speech Info: Adequate    SPECIAL CARE FACTORS FREQUENCY  PT (By licensed PT), Speech therapy     PT Frequency: 5       Speech Therapy Frequency: 5      Contractures      Additional Factors Info  Code Status, Allergies Code Status Info: DNR Allergies Info: Allergies: Amlodipine, Aspirin, Bystolic, Calan, Cardura, Cephalosporins, Clonidine Derivatives, Codeine, Cozaar, Doxazosin, Elavil, Erythromycin, Gantrisin, Hctz, Iodides, Lopressor, Methyldopa, Micardis, Morphine And Related, Nizatidine, Nsaids, Pantoprazole Sodium, Penicillins, Prilosec, Promethazine Hcl, Streptomycin, Sulfa Drugs Cross Reactors, Tequin, Tetracyclines & Related, Toprol Xl, Ultram, Valium, Valturna           Current Medications (09/17/2015):  This is the current hospital active medication list Current Facility-Administered Medications  Medication Dose Route Frequency Provider Last Rate Last Dose  . antiseptic oral rinse (CPC / CETYLPYRIDINIUM CHLORIDE 0.05%) solution 7 mL  7 mL Mouth Rinse BID Max Sane, MD   7 mL at 09/16/15 2247  . ciprofloxacin (CIPRO) tablet 250 mg  250 mg Oral BID Max Sane, MD      . digoxin (LANOXIN) tablet 0.0625 mg  0.0625 mg Oral Q M,W,F Sylvan Cheese, MD   0.0625 mg  at 09/17/15 0818  . digoxin (LANOXIN) tablet 0.125 mg  0.125 mg Oral Q T,Th,S,Su Abdullahi Oseni, MD   0.125 mg at 09/16/15 0945  . dimenhyDRINATE (DRAMAMINE) tablet 50 mg  50 mg Oral Q8H PRN Sylvan Cheese, MD   50 mg at 09/16/15 1840  . feeding supplement (ENSURE ENLIVE) (ENSURE ENLIVE) liquid 237 mL  237 mL Oral TID BM Vipul Shah, MD    237 mL at 09/17/15 1350  . heparin injection 5,000 Units  5,000 Units Subcutaneous 3 times per day Sylvan Cheese, MD   5,000 Units at 09/17/15 1350  . hydrALAZINE (APRESOLINE) injection 10 mg  10 mg Intravenous Q4H PRN Sylvan Cheese, MD      . hydrALAZINE (APRESOLINE) tablet 50 mg  50 mg Oral 4 times per day Max Sane, MD   50 mg at 09/17/15 1225  . nitroGLYCERIN (NITROSTAT) SL tablet 0.4 mg  0.4 mg Sublingual Q5 min PRN Sylvan Cheese, MD      . polyethylene glycol (MIRALAX / GLYCOLAX) packet 17 g  17 g Oral Daily Vipul Shah, MD      . polyvinyl alcohol (LIQUIFILM TEARS) 1.4 % ophthalmic solution 1 drop  1 drop Both Eyes BID Sylvan Cheese, MD   1 drop at 09/15/15 1000  . senna (SENOKOT) tablet 17.2 mg  2 tablet Oral BID Max Sane, MD      . simethicone (MYLICON) chewable tablet 120 mg  120 mg Oral PRN Sylvan Cheese, MD      . sodium chloride (OCEAN) 0.65 % nasal spray 1 spray  1 spray Nasal PRN Sylvan Cheese, MD      . triamcinolone (NASACORT) nasal inhaler 2 spray  2 spray Nasal Daily PRN Sylvan Cheese, MD         Discharge Medications: Please see discharge summary for a list of discharge medications.  Relevant Imaging Results:  Relevant Lab Results:   Additional Information SSN:  [id number]  Darden Dates, LCSW

## 2015-09-17 NOTE — Progress Notes (Signed)
Physical Therapy Evaluation Patient Details Name: [name] MRN: [id number] DOB: [birth year] Today's Date: 09/17/2015   History of Present Illness  82 y.o. female with a known history of Rheumatic heart disease, spinal stenosis, vertigo, hypertension and a recent admission for altered mental status with neg stroke and chest pain workup. Patient was discharged from the hospital 3 days ago. After discharge, patient did well on day of discharge and all day yesterday but at nighttime, patient was unable to focus.  Pt admitted for early sepsis due to UTI and acute encephalopathy.  Clinical Impression  Pt presents with decreased functional mobility, weakness, and decreased cognitive status which is limiting pt's ability to function independently.  Pt has a history of falls and does not always remember to use RW at home.  Pt able to get out of bed with CGA and transfers sit<>stand with CGA.  Pt has limited ambulation tolerance to 40' with RW and CGA.  Pt would benefit from skilled services at discharge in order to return pt safely to home and would benefit from acute PT services to address objective findings.    Follow Up Recommendations SNF    Equipment Recommendations  Rolling walker with 5" wheels    Recommendations for Other Services       Precautions / Restrictions Precautions Precautions: Fall Precaution Comments: Pt states she knows when she is going to fall and has spells where she feels like she is going to pass out. Restrictions Weight Bearing Restrictions: No      Mobility  Bed Mobility Overal bed mobility: Needs Assistance Bed Mobility: Supine to Sit     Supine to sit: Min guard     General bed mobility comments: Pt asking for help to sit up, encouraged independence.  Transfers Overall transfer level: Needs assistance Equipment used: Rolling walker (2 wheeled) Transfers: Sit to/from Stand Sit to Stand: Min guard         General transfer comment: Good body  mechanics with transfer, slow to rise, decreased confidence.  Ambulation/Gait Ambulation/Gait assistance: Min guard Ambulation Distance (Feet): 50 Feet Assistive device: Rolling walker (2 wheeled) Gait Pattern/deviations: Step-through pattern     General Gait Details: Steady with good use of RW, decreased cadence  Stairs            Wheelchair Mobility    Modified Rankin (Stroke Patients Only)       Balance Overall balance assessment: History of Falls;Needs assistance Sitting-balance support: Feet supported Sitting balance-Leahy Scale: Fair Sitting balance - Comments: posterior lean when feet not supported   Standing balance support: Bilateral upper extremity supported;During functional activity Standing balance-Leahy Scale: Fair Standing balance comment: decreased reaction times                             Pertinent Vitals/Pain Pain Assessment: No/denies pain    Home Living Family/patient expects to be discharged to:: Private residence Living Arrangements: Alone Available Help at Discharge: Family;Personal care attendant (2 hours per day) Type of Home: House Home Access: Ramped entrance     Home Layout: One level Home Equipment: Bedside commode;Walker - 4 wheels      Prior Function Level of Independence: Needs assistance   Gait / Transfers Assistance Needed: amb with RW in home when she remembers  ADL's / Homemaking Assistance Needed: Has aides daily M-F for 2 hours per day.  For misc ADL's.  Comments: Lives with dog.     Hand Dominance  Dominant Hand: Right    Extremity/Trunk Assessment   Upper Extremity Assessment: Generalized weakness           Lower Extremity Assessment: Generalized weakness         Communication   Communication: Other (comment) (Repetitive during session.)  Cognition Arousal/Alertness: Awake/alert Behavior During Therapy: WFL for tasks assessed/performed Overall Cognitive Status: Impaired/Different  from baseline Area of Impairment: Memory;Attention;Problem solving   Current Attention Level: Selective Memory: Decreased short-term memory       Problem Solving:  (difficutly attending to task, easily distracted) General Comments: Talking directly to therapist this date.    General Comments General comments (skin integrity, edema, etc.): Daughter at bedside.    Exercises        Assessment/Plan    PT Assessment Patient needs continued PT services  PT Diagnosis Difficulty walking;Generalized weakness   PT Problem List Decreased strength;Decreased activity tolerance;Decreased balance  PT Treatment Interventions DME instruction;Gait training;Functional mobility training;Therapeutic activities;Therapeutic exercise;Balance training;Patient/family education   PT Goals (Current goals can be found in the Care Plan section) Acute Rehab PT Goals Patient Stated Goal: to go home PT Goal Formulation: With patient Time For Goal Achievement: 10/01/15 Potential to Achieve Goals: Fair    Frequency Min 2X/week   Barriers to discharge        Co-evaluation               End of Session Equipment Utilized During Treatment: Gait belt Activity Tolerance: Patient limited by fatigue Patient left: in chair;with call bell/phone within reach;with chair alarm set;with family/visitor present Nurse Communication: Mobility status         Time: 0930-1008 PT Time Calculation (min) (ACUTE ONLY): 38 min   Charges:   PT Evaluation $PT Eval Low Complexity: 1 Procedure     PT G Codes:        Nancy Arvin A Abbigayle Toole, PT 09/17/2015, 11:33 AM

## 2015-09-17 NOTE — Care Management Important Message (Signed)
Important Message  Patient Details  Name: [name] MRN: [id number] Date of Birth: 1920/12/12   Medicare Important Message Given:  Yes    [name] 09/17/2015, 10:31 AM

## 2015-09-17 NOTE — Progress Notes (Signed)
[place] [facility] [place] NAME: [name]    MR#:  [id number]  DATE OF BIRTH:  22-Nov-1920  SUBJECTIVE:  CHIEF COMPLAINT:   Chief Complaint  Patient presents with  . Altered Mental Status  didn't sleep very well last night and was very confused per daughter. REVIEW OF SYSTEMS:  Review of Systems  Constitutional: Negative for fever, weight loss, malaise/fatigue and diaphoresis.  HENT: Negative for ear discharge, ear pain, hearing loss, nosebleeds, sore throat and tinnitus.   Eyes: Negative for blurred vision and pain.  Respiratory: Negative for cough, hemoptysis, shortness of breath and wheezing.   Cardiovascular: Negative for chest pain, palpitations, orthopnea and leg swelling.  Gastrointestinal: Negative for heartburn, nausea, vomiting, abdominal pain, diarrhea, constipation and blood in stool.  Genitourinary: Negative for dysuria, urgency and frequency.  Musculoskeletal: Negative for myalgias and back pain.  Skin: Negative for itching and rash.  Neurological: Negative for dizziness, tingling, tremors, focal weakness, seizures, weakness and headaches.  Psychiatric/Behavioral: Positive for hallucinations. Negative for depression. The patient is not nervous/anxious.    DRUG ALLERGIES:   Allergies  Allergen Reactions  . Amlodipine Nausea Only  . Aspirin   . Bystolic [Nebivolol Hcl]     Vision problem  . Calan [Verapamil Hcl]     Weakness and staggering  . Cardura [Doxazosin Mesylate]   . Cephalosporins   . Clonidine Derivatives     Facial swelling   . Codeine   . Cozaar   . Doxazosin   . Elavil [Amitriptyline Hcl]   . Erythromycin   . Gantrisin [Sulfisoxazole]   . Hctz [Hydrochlorothiazide]   . Iodides   . Lopressor [Metoprolol Tartrate]   . Methyldopa     Facial swelling   . Micardis [Telmisartan]   . Morphine And Related   . Nizatidine   . Nsaids   . Pantoprazole Sodium   . Penicillins   . Prilosec  [Omeprazole]   . Promethazine Hcl   . Streptomycin   . Sulfa Drugs Cross Reactors   . Tequin   . Tetracyclines & Related   . Toprol Xl [Metoprolol Succinate]   . Ultram [Tramadol]   . Valium   . Valturna [Aliskiren-Valsartan] Nausea And Vomiting   VITALS:  Blood pressure 131/52, pulse 80, temperature 98.4 F (36.9 C), temperature source Oral, resp. rate 18, height 5\' 2"  (1.575 m), weight 47.446 kg (104 lb 9.6 oz), SpO2 96 %. PHYSICAL EXAMINATION:  Physical Exam  Constitutional: She is oriented to person, place, and time and well-developed, well-nourished, and in no distress.  HENT:  Head: Normocephalic and atraumatic.  Eyes: Conjunctivae and EOM are normal. Pupils are equal, round, and reactive to light.  Neck: Normal range of motion. Neck supple. No tracheal deviation present. No thyromegaly present.  Cardiovascular: Normal rate, regular rhythm and normal heart sounds.   Pulmonary/Chest: Effort normal and breath sounds normal. No respiratory distress. She has no wheezes. She exhibits no tenderness.  Abdominal: Soft. Bowel sounds are normal. She exhibits no distension. There is no tenderness.  Musculoskeletal: Normal range of motion.  Neurological: She is alert and oriented to person, place, and time. No cranial nerve deficit.  Skin: Skin is warm and dry. No rash noted.  Psychiatric: Mood and affect normal.   LABORATORY PANEL:   CBC  Recent Labs Lab 09/16/15 0710  WBC 8.4  HGB 11.8*  HCT 34.0*  PLT 193   ------------------------------------------------------------------------------------------------------------------ Chemistries   Recent Labs Lab 09/14/15 2158 09/15/15 1021  09/16/15 0710  NA 131* 130* 130*  K 2.9* 3.8 3.3*  CL 102 104 107  CO2 21* 17* 19*  GLUCOSE 118* 109* 95  BUN 21* 20 20  CREATININE 0.62 0.71 0.68  CALCIUM 9.0 8.5* 8.1*  MG 1.9 1.8  --   AST 35  --   --   ALT 39  --   --   ALKPHOS 72  --   --   BILITOT 0.9  --   --    RADIOLOGY:  No  results found. ASSESSMENT AND PLAN:  82 y.o. female with a known history of Rheumatic heart disease, spinal stenosis, vertigo, hypertension and a recent admission for altered mental status with neg stroke and chest pain workup. Patient is unable to provide history due to unwillingness to talk to me directly but will answer questions through son and they said bedside who is "reading her lips" to translate and remains mute to other people. History is mostly obtained from son and daughter at bedside. Patient was discharged from the hospital 3 days ago. After discharge, patient did well on day of discharge and all day yesterday but at nighttime, patient was unable to focus.  1). Early sepsis secondary to likely UTI with pyuria - Present on admission, now resolved - due to possible UTI as urinalysis showed positive leukocytes, nitrites and few bacteria. - Urine cultures growing GNR - further ID pending - on Levaquin  2). Acute encephalopathy - Likely infection related and treat UTI with levofloxacin. During the last admission 3 days ago, neurology examined the patient and recommendation was to treat possible infection.  - Patient's encephalopathy is unusual as she is very functional per her daughter. She is normally alert and oriented 3.  - Appreciate psychiatry input - pt/family not interested in any new meds so held off mirapex - No imaging is indicated at this time due to recent extensive workup  3). Accelerated hypertension -  She takes hydralazine 25 mg QID at home, will increase dose as BP running high. - Monitor blood pressure closely  4). Hypokalemia/hyponatremia - Due to poor PO intake, may be contributing to the generalized weakness. - Replete and Recheck  5). Dysphagia - likely due to patient's screaming during the last admission (3 days ago). Stroke workup was negative.  - Could be due to motility disorder but pt not interested in any further w/up - doubt she would want any  endoscopy - d/w family and they r in agreement - started Ensure  6). Acute Delirium:  - Monitor, avoid benzo's   Family interested in placement - shared with CM/CSW, PT eval recommends STR/SNF  All the records are reviewed and case discussed with Care Management/Social Worker. Management plans discussed with the patient, family and they are in agreement.  CODE STATUS: DNR  TOTAL TIME TAKING CARE OF THIS PATIENT: 35 minutes.   More than 50% of the time was spent in counseling/coordination of care: YES  POSSIBLE D/C IN AM, DEPENDING ON CLINICAL CONDITION. And Placement   [facility], Esker Dever M.D on 09/17/2015 at 12:32 PM  Between 7am to 6pm - Pager - [phone]  After 6pm go to [email or web address] - password [email or web address] [facility]  Office  [phone]  CC: Primary care physician; Dicky Doe, MD  Note: This dictation was prepared with Dragon dictation along with smaller phrase technology. Any transcriptional errors that result from this process are unintentional.

## 2015-09-17 NOTE — Consult Note (Signed)
[facility] Face-to-Face Psychiatry Consult   Reason for Consult:  Consult for this 82 year old woman currently in the hospital with altered mental status for some assistance with diagnosis and treatment. Referring Physician:  Manuella Ghazi Patient Identification: [name] MRN:  [id number] Principal Diagnosis: Altered mental status Diagnosis:   Patient Active Problem List   Diagnosis Date Noted  . Pyuria - likely UTI [N39.0] 09/15/2015  . Acute encephalopathy [G93.40] 09/15/2015  . Hypokalemia [E87.6] 09/15/2015  . Hyponatremia [E87.1] 09/15/2015  . Early Sepsis  [A41.9] 09/15/2015  . Accelerated hypertension [I10] 09/15/2015  . Dysphagia [R13.10] 09/15/2015  . Depression [F32.9] 09/15/2015  . Altered mental status [R41.82] 09/11/2015  . Hypertensive urgency [I16.0] 09/11/2015  . Altered mental state [R41.82] 09/11/2015  . Delirium [R41.0] 09/11/2015  . Medication side effects [T88.7XXA]   . Atypical chest pain [R07.89]   . Palpitations [R00.2] 09/25/2012  . Labile blood pressure [R03.0] 09/15/2012  . ? Orthostatic hypotension [I95.1] 09/13/2012  . Gait instability [R26.81] 09/12/2012  . Headache(784.0) [R51] 09/12/2012  . Hypertension, accelerated [I10] 09/12/2012  . Nausea alone [R11.0] 06/28/2011  . Benign hypertensive heart disease without heart failure [I11.9] 12/14/2010  . Spinal stenosis of lumbar region [M48.06] 12/14/2010  . History of vertigo [Z86.69] 12/14/2010  . Hypercholesterolemia [E78.00] 12/14/2010    Total Time spent with patient: 30 minutes  Subjective:   [name] is a 82 y.o. female patient admitted with "I can't hear you".  Follow-up April 5. Patient interviewed. Spoke with the son as well. Chart reviewed. Patient was awake and alert and cooperative. Initially she told me that this was a wonderful day and that many wonderful things that happened to her but then turned right around and told me that she was extremely anxious. It sounds like she has been  fretting all day with anxious worried that somehow she is going to injure her neck. From what I could put together from speaking with her and her son she had neck surgery sometime in the 1970s and a comment was made at that time that if something or other happened to the bones that she could become paralyzed. Obviously she has not become paralyzed in the last 40 years but she seems to be fretting about it today. Otherwise however seems to be lucid and is not behaving bizarrely. Son tells me that she still slept poorly last night. Affect is actually overall appropriate some anxiety but appropriate reactivity.   HPI:  Patient interviewed as best I could. Her son was present in the room and gave a significant amount of useful history. Chart reviewed labs reviewed. This 82 year old woman was brought into the hospital because her adult children had noticed that her mental status had started declining again over the last couple days. She was responding strangely, not eating well, seemed more confused, seemed very anxious. Patient had just been in the hospital last week with a similar presentation. That apparently had improved over a couple days and she was sent home in the middle of last week and by her son's report had a couple of decent days before things started to go downhill again. The patient herself is either unable or unwilling to give me any history. She has a very strange presentation. She tells me multiple times that she can not hear me but she can respond appropriately to questions asked in a normal tone of voice by both myself and her son. Midway through the interview she started saying that she was not able to see  me or see her son either. Patient did acknowledge that she was feeling anxious and that I was making her upset by asking her so many questions. She was not able to identify any particular recent stress. Was not able really to go into much further detail and after a limited period of time started  to insist that I should leave her alone and leave the room. Patient is not currently on any psychiatric medicine. She had been seen by neurology who do not believe that there has been a new neurologic development or any sign of seizures. She was seen to have a likely urinary tract infection which is being treated and has been proposed as a possible etiology.  Social history: By the son's report the patient had been living independently and taking care of herself and her dog until just a couple weeks ago when things started to decline. She has 2 adult children one of whom is a nurse here at our hospital. Her son whom I spoke to lives in [place] but has been up here helping out for a couple weeks. Patient is widowed. Son describes her as being a long-standing perfectionist but always mentally very sharp.  Substance abuse history: No history of alcohol or drug abuse.  Medical history: Multiple medical problems including hypertension history of atypical chest pain history of dysphasia history of hyponatremia pyuria.  Past Psychiatric History: By the report of the sun and everything I can see in the chart there is no past psychiatric history. As I noted the son described his mother as having always been a bit of a perfectionist and perhaps a little on the anxious side but had never been identified as a treatable problem. No history of hospitalization. No history of suicidal behavior or psychotic behavior. No known history of treatment with psychiatric medicine.  Risk to Self: Is patient at risk for suicide?: No Risk to Others:   Prior Inpatient Therapy:   Prior Outpatient Therapy:    Past Medical History:  Past Medical History  Diagnosis Date  . Hypertension     a. labile HTN  . Atypical chest pain     a. nuclear stress test 04/2010: normal; b. echo 2009: EF 55-60%, mild LVH, impaired LV relaxation, mild aortic sclerosis, mild TR, nl PASP, since echo 2007 no significant change   . History of  spinal stenosis     underwent back surgery on 12/03/09 by Dr. Glenna Fellows  . Hyperlipidemia   . Vertigo     positional vertigo  . Dizziness   . Muscle spasm   . Numbness in feet   . Dyspepsia   . Fatigue   . Headaches, cluster   . Joint pain   . Worsening vision   . Shortness of breath   . Heart palpitations   . Nausea and/or vomiting     with walking  . Hypercholesterolemia     with inability to tolerate statin therapy  . Mitral valve prolapse   . History of liver disease     history of liver trouble, has not had any known esposures to jaundiced person  . History of rheumatic fever     as a child  . 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 Medication side effects     a. 30+ medication allergies and/or intolerances; b. she will not take daily antihypertensives  . Benign hypertensive heart disease without heart failure 12/14/2010    Past Surgical History  Procedure Laterality Date  .  Tonsillectomy    . Breast biopsy    . Bladder fulguration    . Cholecystectomy    . Total abdominal hysterectomy    . Hemorrhoid surgery    . Cervical fusion    . Carpal tunnel release      bilaterally  . Lumbar laminectomy    . Trigger finger release      x2  . Tooth extraction     Family History:  Family History  Problem Relation Age of Onset  . Hypertension Father   . Heart attack Father   . Heart failure Mother    Family Psychiatric  History: There was a limited amount I can learn about this without the patient being cooperative. Was not able to identify from interview with the son and the chart whether there is any family history of mental illness Social History:  History  Alcohol Use No     History  Drug Use No    Social History   Social History  . Marital Status: Widowed    Spouse Name: N/A  . Number of Children: N/A  . Years of Education: N/A   Social History Main Topics  . Smoking status: Never Smoker   . Smokeless tobacco: Never Used  . Alcohol  Use: No  . Drug Use: No  . Sexual Activity: Not Asked   Other Topics Concern  . None   Social History Narrative   Additional Social History:    Allergies:   Allergies  Allergen Reactions  . Amlodipine Nausea Only  . Aspirin   . Bystolic [Nebivolol Hcl]     Vision problem  . Calan [Verapamil Hcl]     Weakness and staggering  . Cardura [Doxazosin Mesylate]   . Cephalosporins   . Clonidine Derivatives     Facial swelling   . Codeine   . Cozaar   . Doxazosin   . Elavil [Amitriptyline Hcl]   . Erythromycin   . Gantrisin [Sulfisoxazole]   . Hctz [Hydrochlorothiazide]   . Iodides   . Lopressor [Metoprolol Tartrate]   . Methyldopa     Facial swelling   . Micardis [Telmisartan]   . Morphine And Related   . Nizatidine   . Nsaids   . Pantoprazole Sodium   . Penicillins   . Prilosec [Omeprazole]   . Promethazine Hcl   . Streptomycin   . Sulfa Drugs Cross Reactors   . Tequin   . Tetracyclines & Related   . Toprol Xl [Metoprolol Succinate]   . Ultram [Tramadol]   . Valium   . Valturna [Aliskiren-Valsartan] Nausea And Vomiting    Labs:  Results for orders placed or performed during the hospital encounter of 09/14/15 (from the past 48 hour(s))  CBC     Status: Abnormal   Collection Time: 09/16/15  7:10 AM  Result Value Ref Range   WBC 8.4 3.6 - 11.0 K/uL   RBC 3.59 (L) 3.80 - 5.20 MIL/uL   Hemoglobin 11.8 (L) 12.0 - 16.0 g/dL   HCT 34.0 (L) 35.0 - 47.0 %   MCV 94.9 80.0 - 100.0 fL   MCH 32.9 26.0 - 34.0 pg   MCHC 34.6 32.0 - 36.0 g/dL   RDW 13.1 11.5 - 14.5 %   Platelets 193 150 - 440 K/uL  Basic metabolic panel     Status: Abnormal   Collection Time: 09/16/15  7:10 AM  Result Value Ref Range   Sodium 130 (L) 135 - 145 mmol/L   Potassium 3.3 (  L) 3.5 - 5.1 mmol/L   Chloride 107 101 - 111 mmol/L   CO2 19 (L) 22 - 32 mmol/L   Glucose, Bld 95 65 - 99 mg/dL   BUN 20 6 - 20 mg/dL   Creatinine, Ser 0.68 0.44 - 1.00 mg/dL   Calcium 8.1 (L) 8.9 - 10.3 mg/dL    GFR calc non Af Amer >60 >60 mL/min   GFR calc Af Amer >60 >60 mL/min    Comment: (NOTE) The eGFR has been calculated using the CKD EPI equation. This calculation has not been validated in all clinical situations. eGFR's persistently <60 mL/min signify possible Chronic Kidney Disease.    Anion gap 4 (L) 5 - 15    Current Facility-Administered Medications  Medication Dose Route Frequency Provider Last Rate Last Dose  . antiseptic oral rinse (CPC / CETYLPYRIDINIUM CHLORIDE 0.05%) solution 7 mL  7 mL Mouth Rinse BID Max Sane, MD   7 mL at 09/16/15 2247  . ciprofloxacin (CIPRO) tablet 250 mg  250 mg Oral BID Max Sane, MD      . digoxin (LANOXIN) tablet 0.0625 mg  0.0625 mg Oral Q M,W,F Sylvan Cheese, MD   0.0625 mg at 09/17/15 0818  . digoxin (LANOXIN) tablet 0.125 mg  0.125 mg Oral Q T,Th,S,Su Abdullahi Oseni, MD   0.125 mg at 09/16/15 0945  . dimenhyDRINATE (DRAMAMINE) tablet 50 mg  50 mg Oral Q8H PRN Sylvan Cheese, MD   50 mg at 09/16/15 1840  . feeding supplement (ENSURE ENLIVE) (ENSURE ENLIVE) liquid 237 mL  237 mL Oral TID BM Vipul Shah, MD   237 mL at 09/17/15 1350  . heparin injection 5,000 Units  5,000 Units Subcutaneous 3 times per day Sylvan Cheese, MD   5,000 Units at 09/17/15 1350  . hydrALAZINE (APRESOLINE) injection 10 mg  10 mg Intravenous Q4H PRN Sylvan Cheese, MD      . hydrALAZINE (APRESOLINE) tablet 50 mg  50 mg Oral 4 times per day Max Sane, MD   50 mg at 09/17/15 1225  . nitroGLYCERIN (NITROSTAT) SL tablet 0.4 mg  0.4 mg Sublingual Q5 min PRN Sylvan Cheese, MD      . polyethylene glycol (MIRALAX / GLYCOLAX) packet 17 g  17 g Oral Daily Vipul Shah, MD      . polyvinyl alcohol (LIQUIFILM TEARS) 1.4 % ophthalmic solution 1 drop  1 drop Both Eyes BID Sylvan Cheese, MD   1 drop at 09/15/15 1000  . senna (SENOKOT) tablet 17.2 mg  2 tablet Oral BID Max Sane, MD      . simethicone (MYLICON) chewable tablet 120 mg  120 mg Oral PRN Sylvan Cheese, MD      .  sodium chloride (OCEAN) 0.65 % nasal spray 1 spray  1 spray Nasal PRN Sylvan Cheese, MD      . triamcinolone (NASACORT) nasal inhaler 2 spray  2 spray Nasal Daily PRN Sylvan Cheese, MD        Musculoskeletal: Strength & Muscle Tone: within normal limits Gait & Station: unsteady Patient leans: N/A  Psychiatric Specialty Exam: Review of Systems  HENT: Negative.   Eyes: Negative.   Respiratory: Negative.   Cardiovascular: Negative.   Gastrointestinal: Negative.   Musculoskeletal: Negative.   Skin: Negative.   Neurological: Positive for weakness.  Psychiatric/Behavioral: Positive for memory loss. Negative for depression, suicidal ideas, hallucinations and substance abuse. The patient is nervous/anxious and has insomnia.     Blood pressure 160/69, pulse 72, temperature 97.9 F (36.6 C),  temperature source Oral, resp. rate 16, height _0  (1.575 m), weight 47.446 kg (104 lb 9.6 oz), SpO2 97 %.Body mass index is 19.13 kg/(m^2).  General Appearance: Casual  Eye Contact::  Good  Speech:  Slow  Volume:  Decreased  Mood:  Anxious  Affect:  Appropriate  Thought Process:  Circumstantial  Orientation:  Negative  Thought Content:  Negative  Suicidal Thoughts:  No  Homicidal Thoughts:  No  Memory:  Immediate;   Negative Recent;   Negative Remote;   Negative  Judgement:  Fair  Insight:  Fair  Psychomotor Activity:  Decreased  Concentration:  Fair  Recall:  Poor  Fund of Knowledge:Fair  Language: Fair  Akathisia:  No  Handed:  Right  AIMS (if indicated):     Assets:  Catering manager Housing Physical Health Resilience Social Support  ADL's:  Impaired  Cognition: Impaired,  Mild  Sleep:      Treatment Plan Summary: Daily contact with patient to assess and evaluate symptoms and progress in treatment, Medication management and Plan Patient was a bit fretful today but was easily redirectable and responded appropriately to reassurance. Did not seem to be responding  to internal stimuli or psychotic. Didn't appear to be overly labile in her mood. I understand that she is probably going to go to rehabilitation tomorrow in under the circumstances I'm sure she is likely to have anxiety. Supportive counseling done. Reassurance completed. No change to medicine I'm not going to order anything at this point. I will check by tomorrow if she is still here.  Disposition: Patient does not meet criteria for psychiatric inpatient admission. Supportive therapy provided about ongoing stressors.  Alethia Berthold, MD 09/17/2015 5:25 PM

## 2015-09-17 NOTE — Progress Notes (Signed)
Patient given warm prune juice to promote bowel movement, per daughter request. Madlyn Frankel, RN

## 2015-09-17 NOTE — Plan of Care (Signed)
Problem: Pain Managment: Goal: General experience of comfort will improve Outcome: Progressing No complaints of pain, pt not able to sleep well this shift, reposition several times during shift.

## 2015-09-18 DIAGNOSIS — R2689 Other abnormalities of gait and mobility: Secondary | ICD-10-CM | POA: Diagnosis not present

## 2015-09-18 DIAGNOSIS — A419 Sepsis, unspecified organism: Secondary | ICD-10-CM | POA: Diagnosis not present

## 2015-09-18 DIAGNOSIS — R4182 Altered mental status, unspecified: Secondary | ICD-10-CM | POA: Diagnosis not present

## 2015-09-18 DIAGNOSIS — N390 Urinary tract infection, site not specified: Secondary | ICD-10-CM | POA: Diagnosis not present

## 2015-09-18 DIAGNOSIS — R531 Weakness: Secondary | ICD-10-CM | POA: Diagnosis not present

## 2015-09-18 DIAGNOSIS — G9340 Encephalopathy, unspecified: Secondary | ICD-10-CM | POA: Diagnosis not present

## 2015-09-18 DIAGNOSIS — Z7401 Bed confinement status: Secondary | ICD-10-CM | POA: Diagnosis not present

## 2015-09-18 DIAGNOSIS — I10 Essential (primary) hypertension: Secondary | ICD-10-CM | POA: Diagnosis not present

## 2015-09-18 DIAGNOSIS — R451 Restlessness and agitation: Secondary | ICD-10-CM | POA: Diagnosis not present

## 2015-09-18 DIAGNOSIS — I340 Nonrheumatic mitral (valve) insufficiency: Secondary | ICD-10-CM | POA: Diagnosis not present

## 2015-09-18 DIAGNOSIS — M6281 Muscle weakness (generalized): Secondary | ICD-10-CM | POA: Diagnosis not present

## 2015-09-18 DIAGNOSIS — R339 Retention of urine, unspecified: Secondary | ICD-10-CM | POA: Diagnosis not present

## 2015-09-18 LAB — CBC
HEMATOCRIT: 38.6 % (ref 35.0–47.0)
Hemoglobin: 13.4 g/dL (ref 12.0–16.0)
MCH: 32.9 pg (ref 26.0–34.0)
MCHC: 34.8 g/dL (ref 32.0–36.0)
MCV: 94.5 fL (ref 80.0–100.0)
Platelets: 227 10*3/uL (ref 150–440)
RBC: 4.09 MIL/uL (ref 3.80–5.20)
RDW: 13 % (ref 11.5–14.5)
WBC: 9.9 10*3/uL (ref 3.6–11.0)

## 2015-09-18 LAB — BASIC METABOLIC PANEL
ANION GAP: 5 (ref 5–15)
BUN: 16 mg/dL (ref 6–20)
CO2: 20 mmol/L — AB (ref 22–32)
Calcium: 8.7 mg/dL — ABNORMAL LOW (ref 8.9–10.3)
Chloride: 104 mmol/L (ref 101–111)
Creatinine, Ser: 0.68 mg/dL (ref 0.44–1.00)
GFR calc Af Amer: >60 mL/min (ref >60)
GFR calc non Af Amer: >60 mL/min (ref >60)
GLUCOSE: 111 mg/dL — AB (ref 65–99)
POTASSIUM: 4.5 mmol/L (ref 3.5–5.1)
Sodium: 129 mmol/L — ABNORMAL LOW (ref 135–145)

## 2015-09-18 MED ORDER — CIPROFLOXACIN HCL 250 MG PO TABS: 250.0000 mg | ORAL_TABLET | Freq: Two times a day (BID) | ORAL | Status: DC

## 2015-09-18 NOTE — Progress Notes (Signed)
Pt is being discharged to [facility]. Report given to [place], Therapist, sports. Discharge packet in pt's slot. AVS given and explained to pt's son. Pt's son verbalized understanding. Awaiting EMS.

## 2015-09-18 NOTE — Clinical Social Work Note (Signed)
CSW spoke with pt, pt's daughter, and pt's son to address discharge plan. PT is recommending SNF. Pt is agreeable to discharge plan to SNF. CSW initiated SNF search and will follow up with bed offers. Pt's family preference is [facility]. Pt's family will visit facilities and then make a choice. Pt's family is aware that pt will likely be ready for discharge tomorrow. CSW will continue to follow.   Darden Dates, MSW, [place] Social Worker  [phone]

## 2015-09-18 NOTE — Discharge Summary (Signed)
[facility] NAME: [name]    MR#:  [id number]  DATE OF BIRTH:  Jan 26, 1921  DATE OF ADMISSION:  09/14/2015 ADMITTING PHYSICIAN: Saundra Shelling, MD  DATE OF DISCHARGE: 09/18/2015  PRIMARY CARE PHYSICIAN: Dicky Doe, MD    ADMISSION DIAGNOSIS:  Acute cystitis without hematuria [N30.00] Altered mental status, unspecified altered mental status type [R41.82]  DISCHARGE DIAGNOSIS:  Principal Problem:   Altered mental status Active Problems:   Pyuria - likely UTI   Acute encephalopathy   Hypokalemia   Hyponatremia   Early Sepsis    Accelerated hypertension   Dysphagia   Depression  SECONDARY DIAGNOSIS:   Past Medical History  Diagnosis Date  . Hypertension     a. labile HTN  . Atypical chest pain     a. nuclear stress test 04/2010: normal; b. echo 2009: EF 55-60%, mild LVH, impaired LV relaxation, mild aortic sclerosis, mild TR, nl PASP, since echo 2007 no significant change   . History of spinal stenosis     underwent back surgery on 12/03/09 by Dr. Glenna Fellows  . Hyperlipidemia   . Vertigo     positional vertigo  . Dizziness   . Muscle spasm   . Numbness in feet   . Dyspepsia   . Fatigue   . Headaches, cluster   . Joint pain   . Worsening vision   . Shortness of breath   . Heart palpitations   . Nausea and/or vomiting     with walking  . Hypercholesterolemia     with inability to tolerate statin therapy  . Mitral valve prolapse   . History of liver disease     history of liver trouble, has not had any known esposures to jaundiced person  . History of rheumatic fever     as a child  . 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 Medication side effects     a. 30+ medication allergies and/or intolerances; b. she will not take daily antihypertensives  . Benign hypertensive heart disease without heart failure 12/14/2010    HOSPITAL COURSE:  82 y.o. female with a known history of  Rheumatic heart disease, spinal stenosis, vertigo, hypertension and a recent admission for altered mental status with neg stroke and chest pain workup, now readmitted for UTI and mental status change likely from same.  1). Early sepsis secondary to likely UTI with pyuria - Present on admission, now resolved - due to E.coli UTI. - improving on abx  2). Acute encephalopathy - Likely due to E.coli UTI  - Close to her baseline  3). Accelerated hypertension - BP much better controlled on higher dose of hydralazine  4). Hypokalemia/hyponatremia - Due to poor PO intake, may be contributing to the generalized weakness. - Repleted and Resolved  5). Dysphagia - Stroke workup was negative.  - Could be due to motility disorder but pt not interested in any further w/up - doubt she would want any endoscopy - d/w family and they r in agreement - started Ensure  6). Acute Delirium or side effect of Levaquin causing some hallucination  - Monitor, avoid benzo's   Family interested in placement - STR/SNF and has bed at [facility] where she is being D/C in stable condition.  DISCHARGE CONDITIONS:   stable  CONSULTS OBTAINED:  Treatment Team:  Gonzella Lex, MD  DRUG ALLERGIES:   Allergies  Allergen Reactions  . Amlodipine Nausea Only  .  Aspirin   . Bystolic [Nebivolol Hcl]     Vision problem  . Calan [Verapamil Hcl]     Weakness and staggering  . Cardura [Doxazosin Mesylate]   . Cephalosporins   . Clonidine Derivatives     Facial swelling   . Codeine   . Cozaar   . Doxazosin   . Elavil [Amitriptyline Hcl]   . Erythromycin   . Gantrisin [Sulfisoxazole]   . Hctz [Hydrochlorothiazide]   . Iodides   . Lopressor [Metoprolol Tartrate]   . Methyldopa     Facial swelling   . Micardis [Telmisartan]   . Morphine And Related   . Nizatidine   . Nsaids   . Pantoprazole Sodium   . Penicillins   . Prilosec [Omeprazole]   . Promethazine Hcl   . Streptomycin   . Sulfa Drugs  Cross Reactors   . Tequin   . Tetracyclines & Related   . Toprol Xl [Metoprolol Succinate]   . Ultram [Tramadol]   . Valium   . Valturna [Aliskiren-Valsartan] Nausea And Vomiting    DISCHARGE MEDICATIONS:   Current Discharge Medication List    START taking these medications   Details  ciprofloxacin (CIPRO) 250 MG tablet Take 1 tablet (250 mg total) by mouth 2 (two) times daily. Qty: 4 tablet, Refills: 0      CONTINUE these medications which have NOT CHANGED   Details  !! digoxin (LANOXIN) 0.125 MG tablet Take 0.0625 mg by mouth See admin instructions. On Monday, Wednesday, and Friday    !! digoxin (LANOXIN) 0.125 MG tablet Take 0.125 mg by mouth See admin instructions. On Tuesday, Thursday, Saturday, and Sunday    dimenhyDRINATE (DRAMAMINE) 50 MG tablet Take 50 mg by mouth every 8 (eight) hours as needed (for nausea).    nitroGLYCERIN (NITROSTAT) 0.4 MG SL tablet Place 0.4 mg under the tongue every 5 (five) minutes as needed for chest pain.    Polyethyl Glycol-Propyl Glycol (SYSTANE ULTRA PF) 0.4-0.3 % SOLN Place 1 drop into both eyes 2 (two) times daily.    Simethicone (GAS-X EXTRA STRENGTH) 125 MG CAPS Take 125 mg by mouth as needed (for gas relief).    sodium chloride (OCEAN) 0.65 % nasal spray Place 1 spray into the nose as needed for congestion.    triamcinolone (NASACORT ALLERGY 24HR) 55 MCG/ACT AERO nasal inhaler Place 2 sprays into the nose daily as needed (for allergies).      !! - Potential duplicate medications found. Please discuss with provider.       DISCHARGE INSTRUCTIONS:    DIET:  Cardiac diet  DISCHARGE CONDITION:  Good  ACTIVITY:  Activity as tolerated  OXYGEN:  Home Oxygen: No.   Oxygen Delivery: room air  DISCHARGE LOCATION:  nursing home   If you experience worsening of your admission symptoms, develop shortness of breath, life threatening emergency, suicidal or homicidal thoughts you must seek medical attention immediately by  calling 911 or calling your MD immediately  if symptoms less severe.  You Must read complete instructions/literature along with all the possible adverse reactions/side effects for all the Medicines you take and that have been prescribed to you. Take any new Medicines after you have completely understood and accpet all the possible adverse reactions/side effects.   Please note  You were cared for by a hospitalist during your hospital stay. If you have any questions about your discharge medications or the care you received while you were in the hospital after you are discharged, you can call  the unit and asked to speak with the hospitalist on call if the hospitalist that took care of you is not available. Once you are discharged, your primary care physician will handle any further medical issues. Please note that NO REFILLS for any discharge medications will be authorized once you are discharged, as it is imperative that you return to your primary care physician (or establish a relationship with a primary care physician if you do not have one) for your aftercare needs so that they can reassess your need for medications and monitor your lab values.    On the day of Discharge:  VITAL SIGNS:  Blood pressure 141/59, pulse 67, temperature 97.8 F (36.6 C), temperature source Oral, resp. rate 18, height 5\' 2"  (1.575 m), weight 47.446 kg (104 lb 9.6 oz), SpO2 98 %. PHYSICAL EXAMINATION:  GENERAL:  83 y.o.-year-old patient lying in the bed with no acute distress.  EYES: Pupils equal, round, reactive to light and accommodation. No scleral icterus. Extraocular muscles intact.  HEENT: Head atraumatic, normocephalic. Oropharynx and nasopharynx clear.  NECK:  Supple, no jugular venous distention. No thyroid enlargement, no tenderness.  LUNGS: Normal breath sounds bilaterally, no wheezing, rales,rhonchi or crepitation. No use of accessory muscles of respiration.  CARDIOVASCULAR: S1, S2 normal. No murmurs, rubs,  or gallops.  ABDOMEN: Soft, non-tender, non-distended. Bowel sounds present. No organomegaly or mass.  EXTREMITIES: No pedal edema, cyanosis, or clubbing.  NEUROLOGIC: Cranial nerves II through XII are intact. Muscle strength 5/5 in all extremities. Sensation intact. Gait not checked.  PSYCHIATRIC: The patient is alert and oriented x 3.  SKIN: No obvious rash, lesion, or ulcer.  DATA REVIEW:   CBC  Recent Labs Lab 09/18/15 0541  WBC 9.9  HGB 13.4  HCT 38.6  PLT 227    Chemistries   Recent Labs Lab 09/14/15 2158 09/15/15 1021  09/18/15 0541  NA 131* 130*  < > 129*  K 2.9* 3.8  < > 4.5  CL 102 104  < > 104  CO2 21* 17*  < > 20*  GLUCOSE 118* 109*  < > 111*  BUN 21* 20  < > 16  CREATININE 0.62 0.71  < > 0.68  CALCIUM 9.0 8.5*  < > 8.7*  MG 1.9 1.8  --   --   AST 35  --   --   --   ALT 39  --   --   --   ALKPHOS 72  --   --   --   BILITOT 0.9  --   --   --   < > = values in this interval not displayed.  Cardiac Enzymes  Recent Labs Lab 09/11/15 2057  TROPONINI 0.09*    Microbiology Results  Results for orders placed or performed during the hospital encounter of 09/14/15  Urine culture     Status: Abnormal   Collection Time: 09/14/15  9:58 PM  Result Value Ref Range Status   Specimen Description URINE, RANDOM  Final   Special Requests NONE  Final   Culture >=100,000 COLONIES/mL ESCHERICHIA COLI (A)  Final   Report Status 09/17/2015 FINAL  Final   Organism ID, Bacteria ESCHERICHIA COLI (A)  Final      Susceptibility   Escherichia coli - MIC*    AMPICILLIN 8 SENSITIVE Sensitive     CEFAZOLIN <=4 SENSITIVE Sensitive     CEFTRIAXONE <=1 SENSITIVE Sensitive     CIPROFLOXACIN <=0.25 SENSITIVE Sensitive     GENTAMICIN <=1 SENSITIVE Sensitive  IMIPENEM <=0.25 SENSITIVE Sensitive     NITROFURANTOIN <=16 SENSITIVE Sensitive     TRIMETH/SULFA <=20 SENSITIVE Sensitive     AMPICILLIN/SULBACTAM <=2 SENSITIVE Sensitive     PIP/TAZO <=4 SENSITIVE Sensitive      Extended ESBL NEGATIVE Sensitive     * >=100,000 COLONIES/mL ESCHERICHIA COLI     Management plans discussed with the patient, family and they are in agreement.  CODE STATUS: DNR  TOTAL TIME TAKING CARE OF THIS PATIENT: 45 minutes.    [facility], Mylez Venable M.D on 09/18/2015 at 7:23 AM  Between 7am to 6pm - Pager - [phone]  After 6pm go to [email or web address] - password [email or web address] [facility]  Office  [phone]  CC: Primary care physician; Dicky Doe, MD   Note: This dictation was prepared with Dragon dictation along with smaller phrase technology. Any transcriptional errors that result from this process are unintentional.

## 2015-09-18 NOTE — Clinical Social Work Note (Addendum)
CSW presented bed offers. Pt's family chose [facility]. Facility is able to accept pt as they have received discharge information. RN will call report and EMS will provide transportation. CSW is signing off as no further needs identified.   Darden Dates, MSW, LCSW  Clinical Social Worker  [phone]  Addendum: Pt and family are agreeable and aware to discharge plan.  Darden Dates, MSW, LCSW

## 2015-09-18 NOTE — Consult Note (Signed)
[facility] Face-to-Face Psychiatry Consult   Reason for Consult:  Consult for this 82 year old woman currently in the hospital with altered mental status for some assistance with diagnosis and treatment. Referring Physician:  Manuella Ghazi Patient Identification: [name] MRN:  [id number] Principal Diagnosis: Altered mental status Diagnosis:   Patient Active Problem List   Diagnosis Date Noted  . Pyuria - likely UTI [N39.0] 09/15/2015  . Acute encephalopathy [G93.40] 09/15/2015  . Hypokalemia [E87.6] 09/15/2015  . Hyponatremia [E87.1] 09/15/2015  . Early Sepsis  [A41.9] 09/15/2015  . Accelerated hypertension [I10] 09/15/2015  . Dysphagia [R13.10] 09/15/2015  . Depression [F32.9] 09/15/2015  . Altered mental status [R41.82] 09/11/2015  . Hypertensive urgency [I16.0] 09/11/2015  . Altered mental state [R41.82] 09/11/2015  . Delirium [R41.0] 09/11/2015  . Medication side effects [T88.7XXA]   . Atypical chest pain [R07.89]   . Palpitations [R00.2] 09/25/2012  . Labile blood pressure [R03.0] 09/15/2012  . ? Orthostatic hypotension [I95.1] 09/13/2012  . Gait instability [R26.81] 09/12/2012  . Headache(784.0) [R51] 09/12/2012  . Hypertension, accelerated [I10] 09/12/2012  . Nausea alone [R11.0] 06/28/2011  . Benign hypertensive heart disease without heart failure [I11.9] 12/14/2010  . Spinal stenosis of lumbar region [M48.06] 12/14/2010  . History of vertigo [Z86.69] 12/14/2010  . Hypercholesterolemia [E78.00] 12/14/2010    Total Time spent with patient: 30 minutes  Subjective:   [name] is a 82 y.o. female patient admitted with "I can't hear you".  Follow-up April 6. Patient seen. Son was also present. Patient was awake and cooperative. Good eye contact. Appropriate interaction. States that she is feeling fine today. She is looking forward to going to rehabilitation. Denies any new symptoms of depression. Affect was upbeat and appropriate. No change in overall mental state. Seems  to be doing well.   HPI:  Patient interviewed as best I could. Her son was present in the room and gave a significant amount of useful history. Chart reviewed labs reviewed. This 82 year old woman was brought into the hospital because her adult children had noticed that her mental status had started declining again over the last couple days. She was responding strangely, not eating well, seemed more confused, seemed very anxious. Patient had just been in the hospital last week with a similar presentation. That apparently had improved over a couple days and she was sent home in the middle of last week and by her son's report had a couple of decent days before things started to go downhill again. The patient herself is either unable or unwilling to give me any history. She has a very strange presentation. She tells me multiple times that she can not hear me but she can respond appropriately to questions asked in a normal tone of voice by both myself and her son. Midway through the interview she started saying that she was not able to see me or see her son either. Patient did acknowledge that she was feeling anxious and that I was making her upset by asking her so many questions. She was not able to identify any particular recent stress. Was not able really to go into much further detail and after a limited period of time started to insist that I should leave her alone and leave the room. Patient is not currently on any psychiatric medicine. She had been seen by neurology who do not believe that there has been a new neurologic development or any sign of seizures. She was seen to have a likely urinary tract infection which is being  treated and has been proposed as a possible etiology.  Social history: By the son's report the patient had been living independently and taking care of herself and her dog until just a couple weeks ago when things started to decline. She has 2 adult children one of whom is a nurse here  at our hospital. Her son whom I spoke to lives in [place] but has been up here helping out for a couple weeks. Patient is widowed. Son describes her as being a long-standing perfectionist but always mentally very sharp.  Substance abuse history: No history of alcohol or drug abuse.  Medical history: Multiple medical problems including hypertension history of atypical chest pain history of dysphasia history of hyponatremia pyuria.  Past Psychiatric History: By the report of the sun and everything I can see in the chart there is no past psychiatric history. As I noted the son described his mother as having always been a bit of a perfectionist and perhaps a little on the anxious side but had never been identified as a treatable problem. No history of hospitalization. No history of suicidal behavior or psychotic behavior. No known history of treatment with psychiatric medicine.  Risk to Self: Is patient at risk for suicide?: No Risk to Others:   Prior Inpatient Therapy:   Prior Outpatient Therapy:    Past Medical History:  Past Medical History  Diagnosis Date  . Hypertension     a. labile HTN  . Atypical chest pain     a. nuclear stress test 04/2010: normal; b. echo 2009: EF 55-60%, mild LVH, impaired LV relaxation, mild aortic sclerosis, mild TR, nl PASP, since echo 2007 no significant change   . History of spinal stenosis     underwent back surgery on 12/03/09 by Dr. Glenna Fellows  . Hyperlipidemia   . Vertigo     positional vertigo  . Dizziness   . Muscle spasm   . Numbness in feet   . Dyspepsia   . Fatigue   . Headaches, cluster   . Joint pain   . Worsening vision   . Shortness of breath   . Heart palpitations   . Nausea and/or vomiting     with walking  . Hypercholesterolemia     with inability to tolerate statin therapy  . Mitral valve prolapse   . History of liver disease     history of liver trouble, has not had any known esposures to jaundiced person  . History of  rheumatic fever     as a child  . 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 Medication side effects     a. 30+ medication allergies and/or intolerances; b. she will not take daily antihypertensives  . Benign hypertensive heart disease without heart failure 12/14/2010    Past Surgical History  Procedure Laterality Date  . Tonsillectomy    . Breast biopsy    . Bladder fulguration    . Cholecystectomy    . Total abdominal hysterectomy    . Hemorrhoid surgery    . Cervical fusion    . Carpal tunnel release      bilaterally  . Lumbar laminectomy    . Trigger finger release      x2  . Tooth extraction     Family History:  Family History  Problem Relation Age of Onset  . Hypertension Father   . Heart attack Father   . Heart failure Mother    Family Psychiatric  History: There  was a limited amount I can learn about this without the patient being cooperative. Was not able to identify from interview with the son and the chart whether there is any family history of mental illness Social History:  History  Alcohol Use No     History  Drug Use No    Social History   Social History  . Marital Status: Widowed    Spouse Name: N/A  . Number of Children: N/A  . Years of Education: N/A   Social History Main Topics  . Smoking status: Never Smoker   . Smokeless tobacco: Never Used  . Alcohol Use: No  . Drug Use: No  . Sexual Activity: Not Asked   Other Topics Concern  . None   Social History Narrative   Additional Social History: Specify valuables returned: [employer] stream[employer], watch.  Allergies:   Allergies  Allergen Reactions  . Amlodipine Nausea Only  . Aspirin   . Bystolic [Nebivolol Hcl]     Vision problem  . Calan [Verapamil Hcl]     Weakness and staggering  . Cardura [Doxazosin Mesylate]   . Cephalosporins   . Clonidine Derivatives     Facial swelling   . Codeine   . Cozaar   . Doxazosin   . Elavil [Amitriptyline Hcl]   . Erythromycin   . Gantrisin  [Sulfisoxazole]   . Hctz [Hydrochlorothiazide]   . Iodides   . Lopressor [Metoprolol Tartrate]   . Methyldopa     Facial swelling   . Micardis [Telmisartan]   . Morphine And Related   . Nizatidine   . Nsaids   . Pantoprazole Sodium   . Penicillins   . Prilosec [Omeprazole]   . Promethazine Hcl   . Streptomycin   . Sulfa Drugs Cross Reactors   . Tequin   . Tetracyclines & Related   . Toprol Xl [Metoprolol Succinate]   . Ultram [Tramadol]   . Valium   . Valturna [Aliskiren-Valsartan] Nausea And Vomiting    Labs:  Results for orders placed or performed during the hospital encounter of 09/14/15 (from the past 48 hour(s))  CBC     Status: None   Collection Time: 09/18/15  5:41 AM  Result Value Ref Range   WBC 9.9 3.6 - 11.0 K/uL   RBC 4.09 3.80 - 5.20 MIL/uL   Hemoglobin 13.4 12.0 - 16.0 g/dL   HCT 45.1 21.9 - 21.7 %   MCV 94.5 80.0 - 100.0 fL   MCH 32.9 26.0 - 34.0 pg   MCHC 34.8 32.0 - 36.0 g/dL   RDW 42.0 66.1 - 51.1 %   Platelets 227 150 - 440 K/uL  Basic metabolic panel     Status: Abnormal   Collection Time: 09/18/15  5:41 AM  Result Value Ref Range   Sodium 129 (L) 135 - 145 mmol/L   Potassium 4.5 3.5 - 5.1 mmol/L   Chloride 104 101 - 111 mmol/L   CO2 20 (L) 22 - 32 mmol/L   Glucose, Bld 111 (H) 65 - 99 mg/dL   BUN 16 6 - 20 mg/dL   Creatinine, Ser 1.04 0.44 - 1.00 mg/dL   Calcium 8.7 (L) 8.9 - 10.3 mg/dL   GFR calc non Af Amer >60 >60 mL/min   GFR calc Af Amer >60 >60 mL/min    Comment: (NOTE) The eGFR has been calculated using the CKD EPI equation. This calculation has not been validated in all clinical situations. eGFR's persistently <60 mL/min signify possible Chronic Kidney Disease.  Anion gap 5 5 - 15    Current Facility-Administered Medications  Medication Dose Route Frequency Provider Last Rate Last Dose  . antiseptic oral rinse (CPC / CETYLPYRIDINIUM CHLORIDE 0.05%) solution 7 mL  7 mL Mouth Rinse BID Max Sane, MD   7 mL at 09/18/15 1245   . ciprofloxacin (CIPRO) tablet 250 mg  250 mg Oral BID Max Sane, MD   250 mg at 09/18/15 1022  . digoxin (LANOXIN) tablet 0.0625 mg  0.0625 mg Oral Q M,W,F Sylvan Cheese, MD   0.0625 mg at 09/17/15 0818  . digoxin (LANOXIN) tablet 0.125 mg  0.125 mg Oral Q T,Th,S,Su Abdullahi Oseni, MD   0.125 mg at 09/18/15 1019  . dimenhyDRINATE (DRAMAMINE) tablet 50 mg  50 mg Oral Q8H PRN Sylvan Cheese, MD   50 mg at 09/18/15 1249  . feeding supplement (ENSURE ENLIVE) (ENSURE ENLIVE) liquid 237 mL  237 mL Oral TID BM Vipul Shah, MD   237 mL at 09/18/15 1554  . heparin injection 5,000 Units  5,000 Units Subcutaneous 3 times per day Sylvan Cheese, MD   5,000 Units at 09/18/15 1554  . hydrALAZINE (APRESOLINE) injection 10 mg  10 mg Intravenous Q4H PRN Sylvan Cheese, MD      . hydrALAZINE (APRESOLINE) tablet 50 mg  50 mg Oral 4 times per day Max Sane, MD   50 mg at 09/18/15 1241  . nitroGLYCERIN (NITROSTAT) SL tablet 0.4 mg  0.4 mg Sublingual Q5 min PRN Sylvan Cheese, MD      . polyethylene glycol (MIRALAX / GLYCOLAX) packet 17 g  17 g Oral Daily Vipul Shah, MD   17 g at 09/18/15 1020  . polyvinyl alcohol (LIQUIFILM TEARS) 1.4 % ophthalmic solution 1 drop  1 drop Both Eyes BID Sylvan Cheese, MD   1 drop at 09/15/15 1000  . senna (SENOKOT) tablet 17.2 mg  2 tablet Oral BID Max Sane, MD   17.2 mg at 09/18/15 1020  . simethicone (MYLICON) chewable tablet 120 mg  120 mg Oral PRN Sylvan Cheese, MD      . sodium chloride (OCEAN) 0.65 % nasal spray 1 spray  1 spray Nasal PRN Sylvan Cheese, MD      . triamcinolone (NASACORT) nasal inhaler 2 spray  2 spray Nasal Daily PRN Sylvan Cheese, MD        Musculoskeletal: Strength & Muscle Tone: within normal limits Gait & Station: unsteady Patient leans: N/A  Psychiatric Specialty Exam: Review of Systems  HENT: Negative.   Eyes: Negative.   Respiratory: Negative.   Cardiovascular: Negative.   Gastrointestinal: Negative.   Musculoskeletal:  Negative.   Skin: Negative.   Neurological: Positive for weakness.  Psychiatric/Behavioral: Positive for memory loss. Negative for depression, suicidal ideas, hallucinations and substance abuse. The patient is nervous/anxious and has insomnia.     Blood pressure 130/57, pulse 79, temperature 97.9 F (36.6 C), temperature source Oral, resp. rate 20, height '5\' 2"'$  (1.575 m), weight 47.446 kg (104 lb 9.6 oz), SpO2 97 %.Body mass index is 19.13 kg/(m^2).  General Appearance: Casual  Eye Contact::  Good  Speech:  Slow  Volume:  Decreased  Mood:  Anxious  Affect:  Appropriate  Thought Process:  Circumstantial  Orientation:  Negative  Thought Content:  Negative  Suicidal Thoughts:  No  Homicidal Thoughts:  No  Memory:  Immediate;   Negative Recent;   Negative Remote;   Negative  Judgement:  Fair  Insight:  Fair  Psychomotor Activity:  Decreased  Concentration:  Fair  Recall:  Poor  Fund of Knowledge:Fair  Language: Fair  Akathisia:  No  Handed:  Right  AIMS (if indicated):     Assets:  Financial Resources/Insurance Housing Physical Health Resilience Social Support  ADL's:  Impaired  Cognition: Impaired,  Mild  Sleep:      Treatment Plan Summary: Daily contact with patient to assess and evaluate symptoms and progress in treatment, Medication management and Plan Patient appears to have stabilized and be doing well. The delirium appears to have cleared with the urinary tract infection. No current sign of depression. Not delirious not agitated or confused. No need for further psychiatric intervention at this point. Wish the patient well with good luck and encouraged her to work hard in rehabilitation.  Disposition: Patient does not meet criteria for psychiatric inpatient admission. Supportive therapy provided about ongoing stressors.  Alethia Berthold, MD 09/18/2015 4:26 PM

## 2015-09-18 NOTE — Discharge Instructions (Signed)

## 2015-09-18 NOTE — Progress Notes (Signed)
Contacted by nursing supervisor that patient had been discharged to [facility], but had not been given prescription for hydralazine.  Per chart review, she had been on a higher dose of hydralazine inpatient prior to discharge with good result in controlling BP.  Handwritten script given to Nurse Supervisor to be faxed to SNF for Hydralazine HCl 50 mg Q6H PO quantity #120.  [facility] 09/18/2015, 9:41 PM

## 2015-09-19 ENCOUNTER — Telehealth: Payer: Self-pay | Admitting: Cardiovascular Disease

## 2015-09-19 ENCOUNTER — Telehealth: Payer: Self-pay | Admitting: Family Medicine

## 2015-09-19 DIAGNOSIS — R4182 Altered mental status, unspecified: Secondary | ICD-10-CM | POA: Diagnosis not present

## 2015-09-19 DIAGNOSIS — R339 Retention of urine, unspecified: Secondary | ICD-10-CM | POA: Diagnosis not present

## 2015-09-19 DIAGNOSIS — I340 Nonrheumatic mitral (valve) insufficiency: Secondary | ICD-10-CM | POA: Diagnosis not present

## 2015-09-19 DIAGNOSIS — I10 Essential (primary) hypertension: Secondary | ICD-10-CM | POA: Diagnosis not present

## 2015-09-19 NOTE — Telephone Encounter (Signed)
Pt's daughter Judson Roch cancelled hospital follow up appt on 13th.  Pt is at [facility] and while she is there Dr. Silvio Pate is in charge of her care.  She scheduled a follow up May 11th.  She is hoping she will be discharged by then.  Her call back number is [phone]

## 2015-09-19 NOTE — Telephone Encounter (Signed)
  Spoke to [employer] at [facility] regarding the patient's BP medications.  She was recently hospitalized from 09/14/2015 - 09/18/2015 for AMS thought to be secondary to a UTI. During that admission, her Hydralazine was increased from 25mg  QID to 50mg  QID for better BP control.  Patty reports the PO Hydralazine was held overnight and this AM due to the patient feeling dizzy and her BP being "low" at 126/58. After not receiving her evening or morning doses her current BP is 170/76.  Instructed her to change the Hydralazine from 50mg  QID to 50mg  TID with holding parameters of SBP <110. This may need to be further titrated pending her BP response.  She voiced appreciation of the call and will inform us if her BP remains significantly elevated or she has persistent hypotension.   Signed, Erma Heritage, PA-C 09/19/2015, 2:20 PM Pager: [phone]

## 2015-09-19 NOTE — Telephone Encounter (Signed)
Has a question regarding pt hydralazine. Please call call. States pt BP this am was 126/58. Please call

## 2015-09-19 NOTE — Telephone Encounter (Signed)
Spoke w/ Patty @ [facility]. Pt's hydralazine 25 mg was d/c'd while in the hospital. Pt's daughter called back after pt discharge and was given written rx for hydralazine 50 mg (not added to pt med list). Pt's BP is now 126/58, pt is c/o dizziness. Patty would like to know if Dr. Rockey Situ recommends pt be on this med and if so, what dose. She is also requesting parameters for pt's BP for when to hold meds.  Please advise.  Thank you.

## 2015-09-22 NOTE — Telephone Encounter (Signed)
Ok-jh 

## 2015-09-25 ENCOUNTER — Inpatient Hospital Stay: Payer: Medicare Other | Admitting: Family Medicine

## 2015-09-30 ENCOUNTER — Ambulatory Visit: Payer: Medicare Other | Admitting: Family Medicine

## 2015-09-30 DIAGNOSIS — R451 Restlessness and agitation: Secondary | ICD-10-CM | POA: Diagnosis not present

## 2015-09-30 DIAGNOSIS — R339 Retention of urine, unspecified: Secondary | ICD-10-CM | POA: Diagnosis not present

## 2015-10-02 ENCOUNTER — Telehealth: Payer: Self-pay | Admitting: Family Medicine

## 2015-10-02 DIAGNOSIS — Z7689 Persons encountering health services in other specified circumstances: Secondary | ICD-10-CM

## 2015-10-02 NOTE — Telephone Encounter (Signed)
[facility] faxed fmla paperwork for pt daughter sara wall i spoke with Clarise Cruz, she stated ms Niebuhr is a patient at [facility] and you saw her the day ms Posten was admitted She stated ms bowels was just admitted to [facility] this month and will be going to long term care tomorrow 4/21 Clarise Cruz stated she would need intermittent time to take ms bowels back and forth to dr appointments fmla paperwork IN Dr Alla German IN BOX

## 2015-10-02 NOTE — Telephone Encounter (Signed)
Please let daughter know the forms are done--I was able to do them here (didn't have to bring them to [facility]) $20 charge

## 2015-10-02 NOTE — Telephone Encounter (Signed)
Left message letting sara know paperwork has been faxed Copy for pt  Copy for scan Copy for billing Copy for file

## 2015-10-17 ENCOUNTER — Ambulatory Visit (INDEPENDENT_AMBULATORY_CARE_PROVIDER_SITE_OTHER): Payer: Medicare Other | Admitting: Urology

## 2015-10-17 ENCOUNTER — Ambulatory Visit
Admission: RE | Admit: 2015-10-17 | Discharge: 2015-10-17 | Disposition: A | Discharge status: Home / Self Care | Payer: Medicare Other | Source: Ambulatory Visit | Attending: Urology | Admitting: Urology

## 2015-10-17 ENCOUNTER — Telehealth: Payer: Self-pay | Admitting: Urology

## 2015-10-17 ENCOUNTER — Encounter: Payer: Self-pay | Admitting: Urology

## 2015-10-17 VITALS — BP 93/55 | HR 93 | Ht 62.0 in | Wt 105.0 lb

## 2015-10-17 DIAGNOSIS — R339 Retention of urine, unspecified: Secondary | ICD-10-CM

## 2015-10-17 DIAGNOSIS — T83511A Infection and inflammatory reaction due to indwelling urethral catheter, initial encounter: Secondary | ICD-10-CM

## 2015-10-17 DIAGNOSIS — R195 Other fecal abnormalities: Secondary | ICD-10-CM | POA: Insufficient documentation

## 2015-10-17 DIAGNOSIS — N390 Urinary tract infection, site not specified: Secondary | ICD-10-CM

## 2015-10-17 DIAGNOSIS — R410 Disorientation, unspecified: Secondary | ICD-10-CM | POA: Diagnosis not present

## 2015-10-17 DIAGNOSIS — T83518A Infection and inflammatory reaction due to other urinary catheter, initial encounter: Secondary | ICD-10-CM | POA: Diagnosis not present

## 2015-10-17 LAB — BLADDER SCAN AMB NON-IMAGING

## 2015-10-17 NOTE — Progress Notes (Signed)
10/17/2015 10:38 AM   [name] 22-Mar-1921 IC:[id number]  Referring provider: Arlis Porta., MD [address] [place], [place] [address]  Chief Complaint  Patient presents with  . Urinary Retention    New Patient    HPI: 82 yo F who was recently admitted to [facility] with significant acute alteration in mental status.  She was admitted on 09/10/15 found to have Klebsiella UTI which was not treated.  Her mental status did improve and she was discharged 2 days later.  She was readmitted a few days later, this time with E. Coli in her urine and treated with appropriate abx.  Her mental status failed to improve on this occation and she remains delerious with ongoing mental status change. Work up for her mental status change was negative including brain MRI, neurology/ cardiology/ psych work up, and ID.  Prior to these hospitalizations, she was living at home performing many of her own ADLs.  Her daughter does not believe she had any obvious baseline voiding issues.  Since her discharge, she has been living at [facility].  She is able to be catheterized at this facility.    She may have a remote history of kidney stones (daughter unsure) and may also have a remove history of hydronephrosis but her daughter unable to provide addition history.    Today, she has no complaints.  She is not able to answer questions appropriately.     PMH: Past Medical History  Diagnosis Date  . Hypertension     a. labile HTN  . Atypical chest pain     a. nuclear stress test 04/2010: normal; b. echo 2009: EF 55-60%, mild LVH, impaired LV relaxation, mild aortic sclerosis, mild TR, nl PASP, since echo 2007 no significant change   . History of spinal stenosis     underwent back surgery on 12/03/09 by Dr. Glenna Fellows  . Hyperlipidemia   . Vertigo     positional vertigo  . Dizziness   . Muscle spasm   . Numbness in feet   . Dyspepsia   . Fatigue   . Headaches, cluster   . Joint pain   . Worsening vision     . Shortness of breath   . Heart palpitations   . Nausea and/or vomiting     with walking  . Hypercholesterolemia     with inability to tolerate statin therapy  . Mitral valve prolapse   . History of liver disease     history of liver trouble, has not had any known esposures to jaundiced person  . History of rheumatic fever     as a child  . 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 Medication side effects     a. 30+ medication allergies and/or intolerances; b. she will not take daily antihypertensives  . Benign hypertensive heart disease without heart failure 12/14/2010    Surgical History: Past Surgical History  Procedure Laterality Date  . Tonsillectomy    . Breast biopsy      multiple  . Bladder fulguration    . Cholecystectomy    . Total abdominal hysterectomy    . Hemorrhoid surgery    . Cervical fusion    . Carpal tunnel release      bilaterally  . Lumbar laminectomy    . Trigger finger release      x2  . Tooth extraction      Home Medications:    Medication List  This list is accurate as of: 10/17/15 11:59 PM.  Always use your most recent med list.               digoxin 0.125 MG tablet  Commonly known as:  LANOXIN  Take 0.0625 mg by mouth See admin instructions. On Monday, Wednesday, and Friday     digoxin 0.125 MG tablet  Commonly known as:  LANOXIN  Take 0.125 mg by mouth See admin instructions. On Tuesday, Thursday, Saturday, and Sunday     dimenhyDRINATE 50 MG tablet  Commonly known as:  DRAMAMINE  Take 50 mg by mouth every 8 (eight) hours as needed (for nausea).     GAS-X EXTRA STRENGTH 125 MG Caps  Generic drug:  Simethicone  Take 125 mg by mouth as needed (for gas relief).     hydrALAZINE 25 MG tablet  Commonly known as:  APRESOLINE     NASACORT ALLERGY 24HR 55 MCG/ACT Aero nasal inhaler  Generic drug:  triamcinolone  Place 2 sprays into the nose daily as needed (for allergies).     nitroGLYCERIN 0.4 MG SL tablet   Commonly known as:  NITROSTAT  Place 0.4 mg under the tongue every 5 (five) minutes as needed for chest pain.     sodium chloride 0.65 % nasal spray  Commonly known as:  OCEAN  Place 1 spray into the nose as needed for congestion.     SYSTANE ULTRA PF 0.4-0.3 % Soln  Generic drug:  Polyethyl Glycol-Propyl Glycol  Place 1 drop into both eyes 2 (two) times daily.        Allergies:  Allergies  Allergen Reactions  . Amlodipine Nausea Only  . Aspirin   . Bystolic [Nebivolol Hcl]     Vision problem  . Calan [Verapamil Hcl]     Weakness and staggering  . Cardura [Doxazosin Mesylate]   . Cephalosporins   . Clonidine Derivatives     Facial swelling   . Codeine   . Cozaar   . Doxazosin   . Elavil [Amitriptyline Hcl]   . Erythromycin   . Gantrisin [Sulfisoxazole]   . Hctz [Hydrochlorothiazide]   . Iodides   . Lopressor [Metoprolol Tartrate]   . Methyldopa     Facial swelling   . Micardis [Telmisartan]   . Morphine And Related   . Nizatidine   . Nsaids   . Pantoprazole Sodium   . Penicillins   . Prilosec [Omeprazole]   . Promethazine Hcl   . Streptomycin   . Sulfa Drugs Cross Reactors   . Tequin   . Tetracyclines & Related   . Toprol Xl [Metoprolol Succinate]   . Ultram [Tramadol]   . Valium   . Valturna [Aliskiren-Valsartan] Nausea And Vomiting    Family History: Family History  Problem Relation Age of Onset  . Hypertension Father   . Heart attack Father   . Heart failure Mother     Social History:  reports that she has never smoked. She has never used smokeless tobacco. She reports that she does not drink alcohol or use illicit drugs.  ROS: UROLOGY Frequent Urination?: Yes Hard to postpone urination?: Yes Burning/pain with urination?: No Get up at night to urinate?: Yes Leakage of urine?: Yes Urine stream starts and stops?: Yes Trouble starting stream?: Yes Do you have to strain to urinate?: Yes Blood in urine?: No Urinary tract infection?:  Yes Sexually transmitted disease?: No Injury to kidneys or bladder?: No Painful intercourse?: No Weak stream?: No Currently pregnant?: No  Vaginal bleeding?: No Last menstrual period?: n  Gastrointestinal Nausea?: Yes Vomiting?: No Indigestion/heartburn?: Yes Diarrhea?: Yes Constipation?: Yes  Constitutional Fever: Yes Night sweats?: No Weight loss?: Yes Fatigue?: Yes  Skin Skin rash/lesions?: Yes Itching?: No  Eyes Blurred vision?: Yes Double vision?: No  Ears/Nose/Throat Sore throat?: No Sinus problems?: No  Hematologic/Lymphatic Swollen glands?: No Easy bruising?: Yes  Cardiovascular Leg swelling?: No Chest pain?: No  Respiratory Cough?: No Shortness of breath?: Yes  Endocrine Excessive thirst?: No  Musculoskeletal Back pain?: Yes Joint pain?: Yes  Neurological Headaches?: No Dizziness?: Yes  Psychologic Depression?: No Anxiety?: Yes  Physical Exam: BP 93/55 mmHg  Pulse 93  Ht 5\' 2"  (1.575 m)  Wt 105 lb (47.628 kg)  BMI 19.20 kg/m2  Constitutional:  In wheel chair.  Accompanied by daughter.  Alert but not oriented.  Does not answers questions appropriately.   HEENT: [place] AT, moist mucus membranes.  Trachea midline, no masses. Cardiovascular: No clubbing, cyanosis, or edema. Respiratory: Normal respiratory effort, no increased work of breathing. GI: Abdomen is soft, nontender, nondistended, no abdominal masses GU: No CVA tenderness. Skin: No rashes, bruises or suspicious lesions. Neurologic: Grossly intact, no focal deficits, moving all 4 extremities. Psychiatric: Normal mood and affect.  Laboratory Data: Lab Results  Component Value Date   WBC 9.9 09/18/2015   HGB 13.4 09/18/2015   HCT 38.6 09/18/2015   MCV 94.5 09/18/2015   PLT 227 09/18/2015    Lab Results  Component Value Date   CREATININE 0.68 09/18/2015    Lab Results  Component Value Date   HGBA1C 5.8* 09/13/2012    Urinalysis Results for orders placed or  performed in visit on 10/17/15  CULTURE, URINE COMPREHENSIVE  Result Value Ref Range   Urine Culture, Comprehensive Final report    Result 1 Comment   Microscopic Examination  Result Value Ref Range   WBC, UA 6-10 (A) 0 -  5 /hpf   RBC, UA 0-2 0 -  2 /hpf   Epithelial Cells (non renal) >10 (A) 0 - 10 /hpf   Bacteria, UA Moderate (A) None seen/Few  Urinalysis, Complete  Result Value Ref Range   Specific Gravity, UA 1.020 1.005 - 1.030   pH, UA 5.0 5.0 - 7.5   Color, UA Yellow Yellow   Appearance Ur Cloudy (A) Clear   Leukocytes, UA 1+ (A) Negative   Protein, UA Negative Negative/Trace   Glucose, UA Negative Negative   Ketones, UA 1+ (A) Negative   RBC, UA Negative Negative   Bilirubin, UA Negative Negative   Urobilinogen, Ur 0.2 0.2 - 1.0 mg/dL   Nitrite, UA Positive (A) Negative   Microscopic Examination See below:   BLADDER SCAN AMB NON-IMAGING  Result Value Ref Range   Scan Result 162ml     Pertinent Imaging: PVR as above- 145 mL  Assessment & Plan:    1. Urinary retention Etiology unclear, ? Secondary to UTI underlying medical/ mental illness. No obvious offending medications.  PVR today remains slightly elevated.  Recommend continuation of CIC bid until PVRs low.  Will discuss this with nursing, advised to call if catheterizing for less than 200 cc.     Alteratives including indwelling Foley and SPT discussed, not ideal given higher risk of infection and duration of retention unclear.    Repeat UCx today- UA collection today difficult, not catheted sample with likely component of contamination  - Urinalysis, Complete - BLADDER SCAN AMB NON-IMAGING - DG Abd 1 View; Future - US Renal;  Future  2. Urinary tract infection associated with catheterization of urinary tract, initial encounter Agree with treatment of UTI Repeat culture today Given limited history today, recommend KUB to r/o stones and RUS to r/o anatomic renal issues including hydronephrosis as  contributing factors  - US Renal; Future - CULTURE, URINE COMPREHENSIVE  3. Delirium Etiology unclear- undergoing further work up with PCP  Return for will call iwth KUB/ RUS results.  Hollice Espy, MD  [facility] [address] [place], [place] [address] [phone]

## 2015-10-17 NOTE — Telephone Encounter (Signed)
Patient's caregiver Roberts Gaudy called and wants a call back, she has a question about your instructions for how long she needed to cath herself. She would like a call back at [phone].   Thanks,  Sharyn Lull

## 2015-10-18 LAB — URINALYSIS, COMPLETE
Bilirubin, UA: NEGATIVE
Glucose, UA: NEGATIVE
NITRITE UA: POSITIVE — AB
PH UA: 5 (ref 5.0–7.5)
Protein, UA: NEGATIVE
RBC, UA: NEGATIVE
Specific Gravity, UA: 1.02 (ref 1.005–1.030)
Urobilinogen, Ur: 0.2 mg/dL (ref 0.2–1.0)

## 2015-10-18 LAB — MICROSCOPIC EXAMINATION

## 2015-10-19 LAB — CULTURE, URINE COMPREHENSIVE

## 2015-10-20 ENCOUNTER — Telehealth: Payer: Self-pay

## 2015-10-20 NOTE — Telephone Encounter (Signed)
Spoke with pt daughter in reference to ucx. Daughter voiced understanding.

## 2015-10-20 NOTE — Telephone Encounter (Signed)
-----   Message from Hollice Espy, MD sent at 10/20/2015  8:26 AM EDT ----- Please call this patient's daughter and let her know that the urine culture was negative. There was just some mixed urogenital flora skin contaminants, low colony count. No indication for treatment at this time.  Hollice Espy, MD

## 2015-10-21 DIAGNOSIS — F0151 Vascular dementia with behavioral disturbance: Secondary | ICD-10-CM | POA: Diagnosis not present

## 2015-10-21 DIAGNOSIS — R339 Retention of urine, unspecified: Secondary | ICD-10-CM | POA: Diagnosis not present

## 2015-10-21 DIAGNOSIS — I10 Essential (primary) hypertension: Secondary | ICD-10-CM | POA: Diagnosis not present

## 2015-10-21 DIAGNOSIS — I340 Nonrheumatic mitral (valve) insufficiency: Secondary | ICD-10-CM | POA: Diagnosis not present

## 2015-10-22 ENCOUNTER — Ambulatory Visit: Payer: Medicare Other | Admitting: Podiatry

## 2015-10-23 ENCOUNTER — Ambulatory Visit
Admission: RE | Admit: 2015-10-23 | Discharge: 2015-10-23 | Disposition: A | Discharge status: Home / Self Care | Payer: Medicare Other | Source: Ambulatory Visit | Attending: Urology | Admitting: Urology

## 2015-10-23 ENCOUNTER — Ambulatory Visit: Payer: Medicare Other | Admitting: Family Medicine

## 2015-10-23 DIAGNOSIS — T83511A Infection and inflammatory reaction due to indwelling urethral catheter, initial encounter: Secondary | ICD-10-CM

## 2015-10-23 DIAGNOSIS — R339 Retention of urine, unspecified: Secondary | ICD-10-CM | POA: Diagnosis not present

## 2015-10-23 DIAGNOSIS — N390 Urinary tract infection, site not specified: Secondary | ICD-10-CM | POA: Diagnosis not present

## 2015-10-24 ENCOUNTER — Telehealth: Payer: Self-pay

## 2015-10-24 NOTE — Telephone Encounter (Signed)
-----   Message from Hollice Espy, MD sent at 10/23/2015  5:41 PM EDT ----- Please let this patient's daughter know that her kidneys looked excellent on renal ultrasound. There is no evidence of stones or kidney abnormalities. She was able to empty her bladder for the most part with only 100 cc left in her bladder on this study. Hopefully, she will continue to have lower residuals and ultimately void independently.    Hollice Espy, MD

## 2015-10-24 NOTE — Telephone Encounter (Signed)
Spoke with pt daughter in reference to RUS results. Daughter voiced understanding.

## 2015-10-28 ENCOUNTER — Encounter: Payer: Self-pay | Admitting: Urology

## 2015-11-03 ENCOUNTER — Telehealth: Payer: Self-pay | Admitting: Cardiovascular Disease

## 2015-11-03 NOTE — Telephone Encounter (Signed)
Signed, on my desk

## 2015-11-03 NOTE — Telephone Encounter (Signed)
Daughter, Hubert Azure dropped off handicapped placard renewal form to be completed and signed by Dr. Rockey Situ.  Please call when ready for pick up.

## 2015-11-03 NOTE — Telephone Encounter (Signed)
Placed on Dr. Donivan Scull desk for review.

## 2015-11-04 NOTE — Telephone Encounter (Signed)
Left message for [name] that form is at the front desk for her p/u at her convenience.

## 2015-11-12 ENCOUNTER — Ambulatory Visit: Payer: Medicare Other | Admitting: Podiatry

## 2015-11-12 DIAGNOSIS — K1329 Other disturbances of oral epithelium, including tongue: Secondary | ICD-10-CM | POA: Diagnosis not present

## 2015-11-14 ENCOUNTER — Telehealth: Payer: Self-pay | Admitting: Radiology

## 2015-11-14 NOTE — Telephone Encounter (Signed)
Caron Presume from [facility] states pt has in & out cath ordered bid. Pt has had less than 100cc output with each cath x 2 weeks. Patty asks for order to do in & out cath on a prn basis. Fax order to [phone].

## 2015-11-14 NOTE — Telephone Encounter (Signed)
That's great news.  Yes, the can stop and do prn.  Hollice Espy, MD

## 2015-11-17 ENCOUNTER — Encounter: Payer: Self-pay | Admitting: Podiatry

## 2015-11-17 ENCOUNTER — Ambulatory Visit (INDEPENDENT_AMBULATORY_CARE_PROVIDER_SITE_OTHER): Payer: Medicare Other | Admitting: Podiatry

## 2015-11-17 DIAGNOSIS — B351 Tinea unguium: Secondary | ICD-10-CM

## 2015-11-17 DIAGNOSIS — M79676 Pain in unspecified toe(s): Secondary | ICD-10-CM

## 2015-11-18 NOTE — Telephone Encounter (Signed)
Spoke with Caron Presume at [facility] and gave verbal rder per Dr. Erlene Quan ok to discontinue CIC 2x daily and only PRN. Patty verbalized understanding/SW

## 2015-11-18 NOTE — Progress Notes (Signed)
She presents today with a family member for routine nail debridement. She has had an cervical mental status changes without history of a CVA since her last visit.  Objective: Vital signs are stable she is alert she has no idea who I am where she is. She has no idea while she is there. Her toenails are thick yellow dystrophic onychomycotic as they always have been her pulses remain strongly palpable bilateral. No open lesions or wounds.  Assessment: Onychomycosis with mental status changes bilateral. Pain in limb secondary to onychomycosis.  Plan: Debridement of toenails 1 through 5 bilateral. Follow up with her in 3 months

## 2015-11-19 DIAGNOSIS — R339 Retention of urine, unspecified: Secondary | ICD-10-CM | POA: Diagnosis not present

## 2015-11-19 DIAGNOSIS — I10 Essential (primary) hypertension: Secondary | ICD-10-CM | POA: Diagnosis not present

## 2015-11-19 DIAGNOSIS — F0150 Vascular dementia without behavioral disturbance: Secondary | ICD-10-CM | POA: Diagnosis not present

## 2015-11-19 DIAGNOSIS — I340 Nonrheumatic mitral (valve) insufficiency: Secondary | ICD-10-CM | POA: Diagnosis not present

## 2015-12-18 DIAGNOSIS — I10 Essential (primary) hypertension: Secondary | ICD-10-CM | POA: Diagnosis not present

## 2015-12-18 DIAGNOSIS — F39 Unspecified mood [affective] disorder: Secondary | ICD-10-CM | POA: Diagnosis not present

## 2015-12-18 DIAGNOSIS — E441 Mild protein-calorie malnutrition: Secondary | ICD-10-CM | POA: Diagnosis not present

## 2015-12-18 DIAGNOSIS — F0150 Vascular dementia without behavioral disturbance: Secondary | ICD-10-CM | POA: Diagnosis not present

## 2015-12-22 DIAGNOSIS — H53462 Homonymous bilateral field defects, left side: Secondary | ICD-10-CM | POA: Diagnosis not present

## 2015-12-25 DIAGNOSIS — S80811A Abrasion, right lower leg, initial encounter: Secondary | ICD-10-CM | POA: Diagnosis not present

## 2015-12-26 DIAGNOSIS — R413 Other amnesia: Secondary | ICD-10-CM | POA: Diagnosis not present

## 2015-12-26 DIAGNOSIS — I69319 Unspecified symptoms and signs involving cognitive functions following cerebral infarction: Secondary | ICD-10-CM | POA: Diagnosis not present

## 2016-01-15 DIAGNOSIS — N390 Urinary tract infection, site not specified: Secondary | ICD-10-CM | POA: Diagnosis not present

## 2016-01-16 DIAGNOSIS — I69319 Unspecified symptoms and signs involving cognitive functions following cerebral infarction: Secondary | ICD-10-CM | POA: Diagnosis not present

## 2016-01-16 DIAGNOSIS — F99 Mental disorder, not otherwise specified: Secondary | ICD-10-CM | POA: Diagnosis not present

## 2016-02-11 DIAGNOSIS — B309 Viral conjunctivitis, unspecified: Secondary | ICD-10-CM | POA: Diagnosis not present

## 2016-02-13 DIAGNOSIS — F0150 Vascular dementia without behavioral disturbance: Secondary | ICD-10-CM | POA: Diagnosis not present

## 2016-02-13 DIAGNOSIS — I10 Essential (primary) hypertension: Secondary | ICD-10-CM | POA: Diagnosis not present

## 2016-02-13 DIAGNOSIS — E43 Unspecified severe protein-calorie malnutrition: Secondary | ICD-10-CM | POA: Diagnosis not present

## 2016-02-13 DIAGNOSIS — F39 Unspecified mood [affective] disorder: Secondary | ICD-10-CM | POA: Diagnosis not present

## 2016-02-18 ENCOUNTER — Ambulatory Visit: Payer: Medicare Other | Admitting: Podiatry

## 2016-02-24 ENCOUNTER — Ambulatory Visit (INDEPENDENT_AMBULATORY_CARE_PROVIDER_SITE_OTHER): Payer: Medicare Other | Admitting: Podiatry

## 2016-02-24 ENCOUNTER — Encounter: Payer: Self-pay | Admitting: Podiatry

## 2016-02-24 DIAGNOSIS — B351 Tinea unguium: Secondary | ICD-10-CM

## 2016-02-24 DIAGNOSIS — M79676 Pain in unspecified toe(s): Secondary | ICD-10-CM | POA: Diagnosis not present

## 2016-02-24 NOTE — Progress Notes (Signed)
SUBJECTIVE Patient with a history of heart conditions presents to office today complaining of elongated, thickened nails. Pain while ambulating in shoes. Patient is unable to trim their own nails.   OBJECTIVE General Patient is awake, alert, and oriented x 3 and in no acute distress. Derm Skin is dry and supple bilateral. Negative open lesions or macerations. Remaining integument unremarkable. Nails are tender, long, thickened and dystrophic with subungual debris, consistent with onychomycosis, 1-5 bilateral. No signs of infection noted. Vasc  DP and PT pedal pulses palpable bilaterally. Temperature gradient within normal limits.  Neuro Epicritic and protective threshold sensation diminished bilaterally.  Musculoskeletal Exam No symptomatic pedal deformities noted bilateral. Muscular strength within normal limits.  ASSESSMENT 1. Heart conditions w/o evidence of peripheral vascular disease 2. Onychomycosis of nail due to dermatophyte bilateral 3. Pain in foot bilateral  PLAN OF CARE Patient evaluated today. Instructed to maintain good pedal hygiene and foot care.  Mechanical debridement of nails 1-5 bilaterally performed using a nail nipper. Filed with dremel without incident.  All patient questions were answered. Return to clinic in 3 mos.    Edrick Kins, DPM

## 2016-03-12 DIAGNOSIS — F0280 Dementia in other diseases classified elsewhere without behavioral disturbance: Secondary | ICD-10-CM | POA: Diagnosis not present

## 2016-03-12 DIAGNOSIS — G301 Alzheimer's disease with late onset: Secondary | ICD-10-CM | POA: Diagnosis not present

## 2016-03-12 DIAGNOSIS — R634 Abnormal weight loss: Secondary | ICD-10-CM | POA: Diagnosis not present

## 2016-03-15 DIAGNOSIS — Z23 Encounter for immunization: Secondary | ICD-10-CM | POA: Diagnosis not present

## 2016-04-14 DIAGNOSIS — K0881 Primary occlusal trauma: Secondary | ICD-10-CM | POA: Diagnosis not present

## 2016-04-15 DIAGNOSIS — S0083XA Contusion of other part of head, initial encounter: Secondary | ICD-10-CM | POA: Diagnosis not present

## 2016-04-16 DIAGNOSIS — R109 Unspecified abdominal pain: Secondary | ICD-10-CM | POA: Diagnosis not present

## 2016-05-25 ENCOUNTER — Ambulatory Visit (INDEPENDENT_AMBULATORY_CARE_PROVIDER_SITE_OTHER): Payer: Medicare Other | Admitting: Podiatry

## 2016-05-25 DIAGNOSIS — M79609 Pain in unspecified limb: Secondary | ICD-10-CM | POA: Diagnosis not present

## 2016-05-25 DIAGNOSIS — L608 Other nail disorders: Secondary | ICD-10-CM

## 2016-05-25 DIAGNOSIS — B351 Tinea unguium: Secondary | ICD-10-CM | POA: Diagnosis not present

## 2016-05-25 DIAGNOSIS — L603 Nail dystrophy: Secondary | ICD-10-CM

## 2016-05-25 NOTE — Progress Notes (Signed)

## 2016-06-10 DIAGNOSIS — I10 Essential (primary) hypertension: Secondary | ICD-10-CM | POA: Diagnosis not present

## 2016-06-11 ENCOUNTER — Ambulatory Visit (INDEPENDENT_AMBULATORY_CARE_PROVIDER_SITE_OTHER): Payer: Medicare Other | Admitting: Cardiovascular Disease

## 2016-06-11 ENCOUNTER — Encounter: Payer: Self-pay | Admitting: Cardiovascular Disease

## 2016-06-11 VITALS — BP 130/60 | HR 69 | Ht 62.0 in | Wt 106.0 lb

## 2016-06-11 DIAGNOSIS — R0989 Other specified symptoms and signs involving the circulatory and respiratory systems: Secondary | ICD-10-CM | POA: Diagnosis not present

## 2016-06-11 DIAGNOSIS — I10 Essential (primary) hypertension: Secondary | ICD-10-CM

## 2016-06-11 DIAGNOSIS — R002 Palpitations: Secondary | ICD-10-CM | POA: Diagnosis not present

## 2016-06-11 DIAGNOSIS — R0602 Shortness of breath: Secondary | ICD-10-CM | POA: Diagnosis not present

## 2016-06-11 NOTE — Patient Instructions (Signed)
Medication Instructions:   No medication changes made  Labwork:  No new labs needed  Testing/Procedures:  We will schedule a carotid ultrasound for bruit   I recommend watching educational videos on topics of interest to you at:       [email or web address]  Enter code: [email or web address]    Follow-Up: It was a pleasure seeing you in the office today. Please call us if you have new issues that need to be addressed before your next appt.  [phone]  Your physician wants you to follow-up in: 12 months.  You will receive a reminder letter in the mail two months in advance. If you don't receive a letter, please call our office to schedule the follow-up appointment.  If you need a refill on your cardiac medications before your next appointment, please call your pharmacy.

## 2016-06-11 NOTE — Progress Notes (Signed)
Cardiology Office Note  Date:  06/11/2016   ID:  Rocsi, Jessel [birth year], MRN [id number]  PCP:  Viviana Simpler, MD   Chief Complaint  Patient presents with  . other    6 month follow up. Meds reviewed by the pt's med list from [facility]. Pt. c/o shortness of breath.     HPI:  82 year old woman with a past history of labile hypertension and hypercholesterolemia, significant medication allergies, history of severe back pain secondary to spinal stenosis, chronic issues with dizziness nausea and profound weakness previously seen for management of her blood pressure. History of falls Recent echocardiogram showing normal ejection fraction, stress test showing no ischemia She presents today for follow-up of her hypertension  In follow-up today, daughter presents with her we reviewed the events of the past year with her in detail She was in the hospital at the end of March 2017 and again beginning of April 2017 Mental status changes, possibly on a tract infection  Currently living at [facility] [place], reports she is doing well, denies any new symptoms Legs week, presents in a walker 1 recent fall, hurt her right shoulder Reports her blood pressure has been relatively well-controlled medications reviewed with her in detail, numerous medication allergies  EKG on today's visit shows normal sinus rhythm with rate 69 bpm, left axis deviation, no significant ST or T-wave changes  Other past medical history Previously admitted to [facility] as above after undergoing oral surgery. She was found to have a BP of 218/113 at the oral surgeon's office at sent to [facility] for further evaluation. found to have a BP of 188/82 (previously 218/113 at her oral surgeon).  she had stopped taking her prn hydralazine all together.   She did complain of chest pain during her admission with negative TnI - felt to be atypical in nature.   On a prior clinic visit she was taking methyldopa and felt that she was  having side effects including nausea and vomiting. Also with malaise and fatigue. She requested to go back on clonidine. Clonidine was restarted at a low dose as she was taking previously. She states having symptoms on a low dose clonidine despite having taken this before. Again symptoms as above. We changed to Cardura 2 mg in the evening. She didn't have side effects on the medication.   normal nuclear stress test in March 2011.   echocardiogram in 2009 showing impaired relaxation and mild aortic sclerosis.   hospitalized at [facility] from 09/12/12 until 09/15/12.  She was hospitalized because of difficulty walking, found to have orthostatic hypotension as well as severe hypertension at times.  She had an MRI which was negative.     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  PSH:    Past Surgical History:  Procedure Laterality Date  . BLADDER FULGURATION    . BREAST BIOPSY     multiple  . CARPAL TUNNEL RELEASE     bilaterally  . CERVICAL FUSION    . CHOLECYSTECTOMY    . HEMORRHOID SURGERY    . LUMBAR LAMINECTOMY    . TONSILLECTOMY    .  TOOTH EXTRACTION    . TOTAL ABDOMINAL HYSTERECTOMY    . TRIGGER FINGER RELEASE     x2    Current Outpatient Prescriptions  Medication Sig Dispense Refill  . citalopram (CELEXA) 20 MG tablet Take 20 mg by mouth daily.     . digoxin (LANOXIN) 0.125 MG tablet Take 0.0625 mg by mouth See admin instructions. On Monday, Wednesday, and Friday    .  digoxin (LANOXIN) 0.125 MG tablet Take 0.125 mg by mouth See admin instructions. On Tuesday, Thursday, Saturday, and Sunday    . hydrALAZINE (APRESOLINE) 50 MG tablet Take 50 mg by mouth 3 (three) times daily.     . mirtazapine (REMERON) 30 MG tablet     . nitroGLYCERIN (NITROSTAT) 0.4 MG SL tablet Place 0.4 mg under the tongue every 5 (five) minutes as needed for chest pain.    Vladimir Faster Glycol-Propyl Glycol (SYSTANE ULTRA PF) 0.4-0.3 % SOLN Place 1 drop into both eyes 2 (two) times daily.    . Simethicone (GAS-X EXTRA STRENGTH) 125 MG CAPS Take 125 mg by mouth as needed (for gas relief).    . sodium chloride (OCEAN) 0.65 % nasal spray Place 1 spray into the nose as needed for congestion.    . triamcinolone (NASACORT ALLERGY 24HR) 55 MCG/ACT AERO nasal inhaler Place 2 sprays into the nose daily as needed (for allergies).      No current facility-administered medications for this visit.      Allergies:   Amlodipine; Aspirin; Bystolic [nebivolol hcl]; Calan [verapamil hcl]; Cardura [doxazosin mesylate]; Cephalosporins; Clonidine derivatives; Codeine; Cozaar; Doxazosin; Elavil [amitriptyline hcl]; Erythromycin; Gantrisin [sulfisoxazole]; Hctz [hydrochlorothiazide]; Iodides; Lopressor [metoprolol tartrate]; Methyldopa; Micardis [telmisartan]; Morphine and related; Nizatidine; Nsaids; Pantoprazole sodium; Penicillins; Prilosec [omeprazole]; Promethazine hcl; Streptomycin; Sulfa drugs cross reactors; Tequin; Tetracyclines & related; Toprol xl [metoprolol succinate]; Ultram [tramadol]; Valium; and Valturna [aliskiren-valsartan]   Social History:  The patient  reports that she has never smoked. She has never used smokeless tobacco. She reports that she does not drink alcohol or use drugs.   Family History:   family history includes Heart attack in her father; Heart failure in her mother; Hypertension in her father.    Review of Systems: Review of Systems  Constitutional: Negative.   Respiratory:  Negative.   Cardiovascular: Negative.   Gastrointestinal: Negative.   Musculoskeletal: Positive for falls.       Leg weakness  Neurological: Negative.   Psychiatric/Behavioral: Negative.   All other systems reviewed and are negative.    PHYSICAL EXAM: VS:  BP 130/60 (BP Location: Right Arm, Patient Position: Sitting, Cuff Size: Normal)   Pulse 69   Ht 5\' 2"  (1.575 m)   Wt 106 lb (48.1 kg)   BMI 19.39 kg/m  , BMI Body mass index is 19.39 kg/m. GEN:  in no acute distress , Thin, frail  HEENT: normal  Neck: no JVD, + right greater than left carotid  bruits, no masses Cardiac: RRR; 1 to 2 SEM RSB,  no rubs, or gallops,no edema  Respiratory:  clear to auscultation bilaterally, normal work of breathing GI: soft, nontender, nondistended, + BS MS: no deformity or atrophy  Skin: warm and dry, no rash Neuro:  Strength and sensation are intact Psych: euthymic mood, full affect    Recent Labs: 09/14/2015: ALT 39; TSH 2.619 09/15/2015: Magnesium 1.8 09/18/2015: BUN 16; Creatinine, Ser 0.68; Hemoglobin 13.4; Platelets 227; Potassium 4.5; Sodium 129    Lipid Panel Lab Results  Component Value Date   CHOL 229 (  H) 09/13/2012   HDL 38 (L) 09/13/2012   LDLCALC 157 (H) 09/13/2012   TRIG 168 (H) 09/13/2012      Wt Readings from Last 3 Encounters:  06/11/16 106 lb (48.1 kg)  10/17/15 105 lb (47.6 kg)  09/17/15 104 lb 9.6 oz (47.4 kg)       ASSESSMENT AND PLAN:  Hypertension, accelerated - Plan: EKG 12-Lead, VAS US CAROTID Blood pressure is well controlled on today's visit. No changes made to the medications.  Palpitations - Plan: EKG 12-Lead, VAS US CAROTID Patient denies any palpitations We did question why she is on digoxin, patient and daughter prefer to leave the digoxin on the medication list. As she is doing well, they do not want anything changed Normal ejection fraction when echocardiogram reviewed  SOB (shortness of breath) - Plan: EKG 12-Lead, VAS US CAROTID Patient  denies any significant shortness of breath on exertion Previous echocardiogram showing normal LV function  Bruit - Plan: VAS US CAROTID Right greater than left carotid bruits on exam. Discussed various treatment options with patient and patient's daughter. They're willing to proceed with carotid ultrasound for further review. Daughter does not think that patient would agree to intervention if there is severe stenosis  Hyperlipidemia Currently not on a statin   Total encounter time more than 25 minutes  Greater than 50% was spent in counseling and coordination of care with the patient   Disposition:   F/U  12 months   Orders Placed This Encounter  Procedures  . EKG 12-Lead     Signed, Esmond Plants, M.D., Ph.D. 06/11/2016  [place], [place]

## 2016-06-16 DIAGNOSIS — F0150 Vascular dementia without behavioral disturbance: Secondary | ICD-10-CM | POA: Diagnosis not present

## 2016-06-16 DIAGNOSIS — I340 Nonrheumatic mitral (valve) insufficiency: Secondary | ICD-10-CM

## 2016-06-16 DIAGNOSIS — E43 Unspecified severe protein-calorie malnutrition: Secondary | ICD-10-CM

## 2016-06-16 DIAGNOSIS — F39 Unspecified mood [affective] disorder: Secondary | ICD-10-CM

## 2016-06-16 DIAGNOSIS — I10 Essential (primary) hypertension: Secondary | ICD-10-CM

## 2016-06-18 DIAGNOSIS — F0280 Dementia in other diseases classified elsewhere without behavioral disturbance: Secondary | ICD-10-CM | POA: Diagnosis not present

## 2016-06-18 DIAGNOSIS — G301 Alzheimer's disease with late onset: Secondary | ICD-10-CM | POA: Diagnosis not present

## 2016-06-18 DIAGNOSIS — R634 Abnormal weight loss: Secondary | ICD-10-CM | POA: Diagnosis not present

## 2016-06-18 DIAGNOSIS — M25511 Pain in right shoulder: Secondary | ICD-10-CM | POA: Diagnosis not present

## 2016-07-05 DIAGNOSIS — I10 Essential (primary) hypertension: Secondary | ICD-10-CM | POA: Diagnosis not present

## 2016-07-15 ENCOUNTER — Ambulatory Visit: Payer: Medicare Other

## 2016-07-15 DIAGNOSIS — R002 Palpitations: Secondary | ICD-10-CM

## 2016-07-15 DIAGNOSIS — R0989 Other specified symptoms and signs involving the circulatory and respiratory systems: Secondary | ICD-10-CM | POA: Diagnosis not present

## 2016-07-15 DIAGNOSIS — I10 Essential (primary) hypertension: Secondary | ICD-10-CM

## 2016-07-15 DIAGNOSIS — R0602 Shortness of breath: Secondary | ICD-10-CM

## 2016-08-30 ENCOUNTER — Encounter: Payer: Self-pay | Admitting: Podiatry

## 2016-08-30 ENCOUNTER — Ambulatory Visit (INDEPENDENT_AMBULATORY_CARE_PROVIDER_SITE_OTHER): Payer: Medicare Other | Admitting: Podiatry

## 2016-08-30 DIAGNOSIS — B351 Tinea unguium: Secondary | ICD-10-CM | POA: Diagnosis not present

## 2016-08-30 DIAGNOSIS — M79609 Pain in unspecified limb: Secondary | ICD-10-CM

## 2016-08-30 NOTE — Progress Notes (Signed)
Complaint:  Visit Type: Patient returns to my office for continued preventative foot care services. Complaint: Patient states" my nails have grown long and thick and become painful to walk and wear shoes" Patient has been diagnosed with CVA.. The patient presents for preventative foot care services. No changes to ROS  Podiatric Exam: Vascular: dorsalis pedis and posterior tibial pulses are barely  palpable bilateral. Capillary return is immediate. Cold feet.. Skin turgor WNL  Sensorium: Normal Semmes Weinstein monofilament test. Normal tactile sensation bilaterally. Nail Exam: Pt has thick disfigured discolored nails with subungual debris noted bilateral entire nail hallux through fifth toenails Ulcer Exam: There is no evidence of ulcer or pre-ulcerative changes or infection. Orthopedic Exam: Muscle tone and strength are WNL. No limitations in general ROM. No crepitus or effusions noted. Foot type and digits show no abnormalities. Bony prominences are unremarkable. Skin: No Porokeratosis. No infection or ulcers  Diagnosis:  Onychomycosis, , Pain in right toe, pain in left toes  Treatment & Plan Procedures and Treatment: Consent by patient was obtained for treatment procedures. The patient understood the discussion of treatment and procedures well. All questions were answered thoroughly reviewed. Debridement of mycotic and hypertrophic toenails, 1 through 5 bilateral and clearing of subungual debris. No ulceration, no infection noted.   Return Visit-Office Procedure: Patient instructed to return to the office for a follow up visit 3 months for continued evaluation and treatment.    Ilynn Stauffer DPM 

## 2016-09-01 ENCOUNTER — Telehealth: Payer: Self-pay | Admitting: Internal Medicine

## 2016-09-01 DIAGNOSIS — Z7689 Persons encountering health services in other specified circumstances: Secondary | ICD-10-CM

## 2016-09-01 NOTE — Telephone Encounter (Signed)
Daughter dropped off fmla paperwork In dr [name] IN BOX  I attached last years FMLA paperwork in folder also

## 2016-09-01 NOTE — Telephone Encounter (Signed)
Form done $20 charge 

## 2016-09-02 NOTE — Telephone Encounter (Signed)
Daughter aware paperwork is ready for pick up

## 2016-09-07 DIAGNOSIS — F0150 Vascular dementia without behavioral disturbance: Secondary | ICD-10-CM | POA: Diagnosis not present

## 2016-09-07 DIAGNOSIS — I10 Essential (primary) hypertension: Secondary | ICD-10-CM | POA: Diagnosis not present

## 2016-09-07 DIAGNOSIS — F39 Unspecified mood [affective] disorder: Secondary | ICD-10-CM

## 2016-09-07 DIAGNOSIS — R000 Tachycardia, unspecified: Secondary | ICD-10-CM | POA: Diagnosis not present

## 2016-09-07 DIAGNOSIS — E441 Mild protein-calorie malnutrition: Secondary | ICD-10-CM | POA: Diagnosis not present

## 2016-09-08 NOTE — Telephone Encounter (Signed)
Copy for pt Copy for file Copy for scan Copy for billing

## 2016-09-11 ENCOUNTER — Encounter: Payer: Self-pay | Admitting: Emergency Medicine

## 2016-09-11 ENCOUNTER — Emergency Department
Admission: EM | Admit: 2016-09-11 | Discharge: 2016-09-11 | Disposition: A | Discharge status: Home / Self Care | Payer: Medicare Other | Attending: Emergency Medicine | Admitting: Emergency Medicine

## 2016-09-11 ENCOUNTER — Emergency Department: Payer: Medicare Other

## 2016-09-11 DIAGNOSIS — S4991XA Unspecified injury of right shoulder and upper arm, initial encounter: Secondary | ICD-10-CM | POA: Diagnosis present

## 2016-09-11 DIAGNOSIS — W19XXXA Unspecified fall, initial encounter: Secondary | ICD-10-CM

## 2016-09-11 DIAGNOSIS — R51 Headache: Secondary | ICD-10-CM | POA: Diagnosis not present

## 2016-09-11 DIAGNOSIS — Y929 Unspecified place or not applicable: Secondary | ICD-10-CM | POA: Diagnosis not present

## 2016-09-11 DIAGNOSIS — Y999 Unspecified external cause status: Secondary | ICD-10-CM | POA: Insufficient documentation

## 2016-09-11 DIAGNOSIS — M542 Cervicalgia: Secondary | ICD-10-CM | POA: Diagnosis not present

## 2016-09-11 DIAGNOSIS — W1839XA Other fall on same level, initial encounter: Secondary | ICD-10-CM | POA: Diagnosis not present

## 2016-09-11 DIAGNOSIS — S42201A Unspecified fracture of upper end of right humerus, initial encounter for closed fracture: Secondary | ICD-10-CM | POA: Diagnosis not present

## 2016-09-11 DIAGNOSIS — R9431 Abnormal electrocardiogram [ECG] [EKG]: Secondary | ICD-10-CM | POA: Diagnosis not present

## 2016-09-11 DIAGNOSIS — S12100A Unspecified displaced fracture of second cervical vertebra, initial encounter for closed fracture: Secondary | ICD-10-CM | POA: Insufficient documentation

## 2016-09-11 DIAGNOSIS — M25511 Pain in right shoulder: Secondary | ICD-10-CM | POA: Diagnosis not present

## 2016-09-11 DIAGNOSIS — I10 Essential (primary) hypertension: Secondary | ICD-10-CM | POA: Insufficient documentation

## 2016-09-11 DIAGNOSIS — Z79899 Other long term (current) drug therapy: Secondary | ICD-10-CM | POA: Diagnosis not present

## 2016-09-11 DIAGNOSIS — Y939 Activity, unspecified: Secondary | ICD-10-CM | POA: Insufficient documentation

## 2016-09-11 LAB — BASIC METABOLIC PANEL
Anion gap: 7 (ref 5–15)
BUN: 23 mg/dL — AB (ref 6–20)
CALCIUM: 8.9 mg/dL (ref 8.9–10.3)
CO2: 20 mmol/L — AB (ref 22–32)
Chloride: 107 mmol/L (ref 101–111)
Creatinine, Ser: 0.99 mg/dL (ref 0.44–1.00)
GFR calc Af Amer: 55 mL/min — ABNORMAL LOW (ref >60)
GFR, EST NON AFRICAN AMERICAN: 48 mL/min — AB (ref >60)
GLUCOSE: 114 mg/dL — AB (ref 65–99)
POTASSIUM: 4.2 mmol/L (ref 3.5–5.1)
Sodium: 134 mmol/L — ABNORMAL LOW (ref 135–145)

## 2016-09-11 LAB — CBC
HCT: 33.8 % — ABNORMAL LOW (ref 35.0–47.0)
HEMOGLOBIN: 11.5 g/dL — AB (ref 12.0–16.0)
MCH: 31.7 pg (ref 26.0–34.0)
MCHC: 33.9 g/dL (ref 32.0–36.0)
MCV: 93.5 fL (ref 80.0–100.0)
PLATELETS: 210 10*3/uL (ref 150–440)
RBC: 3.62 MIL/uL — AB (ref 3.80–5.20)
RDW: 14.2 % (ref 11.5–14.5)
WBC: 12.5 10*3/uL — ABNORMAL HIGH (ref 3.6–11.0)

## 2016-09-11 NOTE — ED Triage Notes (Signed)
Patient is a resident at [facility].  Patient fell yesterday morning and was found sitting on the ground.  Patient does not remember fall and states she just remembers being on the floor.   C/O neck pain.  Bruising seen to upper posterior neck and to right upper arm with a skin tear.  Patient is AAOx2.  Skin warm and dry.  MAE

## 2016-09-11 NOTE — ED Notes (Signed)
Patient transported to CT at this time. 

## 2016-09-11 NOTE — ED Notes (Signed)
C collar applied to patient per verbal order from Dr. Cinda Quest.

## 2016-09-11 NOTE — ED Provider Notes (Signed)
[facility] Emergency Department Provider Note   ____________________________________________   First MD Initiated Contact with Patient 09/11/16 0740     (approximate)  I have reviewed the triage vital signs and the nursing notes.   HISTORY  Chief Complaint Fall    HPI [name] is a 83 y.o. female drum 20 legs. Patient was found sitting on the ground. She does not remember falling. She does not remember hitting her head. She does have a skin tear on the right upper arm and some pain in the back of her neck. Patient doesn't remember hitting her neck either. Patient's family comes in with her reports she's had a stroke and has trouble with her recent memory but can't remember the past for a well. They said she had a cervical fusion many many years ago. Patient confirms a says nothing else hurts her except her neck.   Past Medical History:  Diagnosis Date  . Atypical chest pain    a. nuclear stress test 04/2010: normal; b. echo 2009: EF 55-60%, mild LVH, impaired LV relaxation, mild aortic sclerosis, mild TR, nl PASP, since echo 2007 no significant change   . Benign hypertensive heart disease without heart failure 12/14/2010  . Dizziness   . Dyspepsia   . Fatigue   . Headaches, cluster   . Heart palpitations   . History of liver disease    history of liver trouble, has not had any known esposures to jaundiced person  . History of rheumatic fever    as a child  . History of spinal stenosis    underwent back surgery on 12/03/09 by Dr. Glenna Fellows  . Hypercholesterolemia    with inability to tolerate statin therapy  . Hyperlipidemia   . Hypertension    a. labile HTN  . Joint pain   . Medication side effects    a. 30+ medication allergies and/or intolerances; b. she will not take daily antihypertensives  . Mitral valve prolapse   . Muscle spasm   . Nausea and/or vomiting    with walking  . Numbness in feet   . Other specified congenital anomaly  of heart(746.89)   . Shortness of breath   . Vertigo    positional vertigo  . Worsening vision     Patient Active Problem List   Diagnosis Date Noted  . Pyuria - likely UTI 09/15/2015  . Acute encephalopathy 09/15/2015  . Hypokalemia 09/15/2015  . Hyponatremia 09/15/2015  . Early Sepsis  09/15/2015  . Accelerated hypertension 09/15/2015  . Dysphagia 09/15/2015  . Depression 09/15/2015  . Altered mental status 09/11/2015  . Hypertensive urgency 09/11/2015  . Altered mental state 09/11/2015  . Delirium 09/11/2015  . Medication side effects   . Atypical chest pain   . Palpitations 09/25/2012  . Labile blood pressure 09/15/2012  . ? Orthostatic hypotension 09/13/2012  . Gait instability 09/12/2012  . Headache(784.0) 09/12/2012  . Hypertension, accelerated 09/12/2012  . Nausea alone 06/28/2011  . Benign hypertensive heart disease without heart failure 12/14/2010  . Spinal stenosis of lumbar region 12/14/2010  . History of vertigo 12/14/2010  . Hypercholesterolemia 12/14/2010    Past Surgical History:  Procedure Laterality Date  . BLADDER FULGURATION    . BREAST BIOPSY     multiple  . CARPAL TUNNEL RELEASE     bilaterally  . CERVICAL FUSION    . CHOLECYSTECTOMY    . HEMORRHOID SURGERY    . LUMBAR LAMINECTOMY    . TONSILLECTOMY    .  TOOTH EXTRACTION    . TOTAL ABDOMINAL HYSTERECTOMY    . TRIGGER FINGER RELEASE     x2    Prior to Admission medications   Medication Sig Start Date End Date Taking? Authorizing Provider  citalopram (CELEXA) 20 MG tablet Take 20 mg by mouth daily.     Historical Provider, MD  digoxin (LANOXIN) 0.125 MG tablet Take 0.0625 mg by mouth See admin instructions. On Monday, Wednesday, and Friday    Historical Provider, MD  digoxin (LANOXIN) 0.125 MG tablet Take 0.125 mg by mouth See admin instructions. On Tuesday, Thursday, Saturday, and Sunday    Historical Provider, MD  hydrALAZINE (APRESOLINE) 50 MG tablet Take 50 mg by mouth 3 (three) times  daily.  06/08/16   Historical Provider, MD  mirtazapine (REMERON) 30 MG tablet  06/05/16   Historical Provider, MD  nitroGLYCERIN (NITROSTAT) 0.4 MG SL tablet Place 0.4 mg under the tongue every 5 (five) minutes as needed for chest pain.    Historical Provider, MD  Polyethyl Glycol-Propyl Glycol (SYSTANE ULTRA PF) 0.4-0.3 % SOLN Place 1 drop into both eyes 2 (two) times daily.    Historical Provider, MD  Simethicone (GAS-X EXTRA STRENGTH) 125 MG CAPS Take 125 mg by mouth as needed (for gas relief).    Historical Provider, MD  sodium chloride (OCEAN) 0.65 % nasal spray Place 1 spray into the nose as needed for congestion.    Historical Provider, MD  triamcinolone (NASACORT ALLERGY 24HR) 55 MCG/ACT AERO nasal inhaler Place 2 sprays into the nose daily as needed (for allergies).     Historical Provider, MD    Allergies Amlodipine; Aspirin; Bystolic [nebivolol hcl]; Calan [verapamil hcl]; Cardura [doxazosin mesylate]; Cephalosporins; Clonidine derivatives; Codeine; Cozaar; Doxazosin; Elavil [amitriptyline hcl]; Erythromycin; Gantrisin [sulfisoxazole]; Hctz [hydrochlorothiazide]; Iodides; Lopressor [metoprolol tartrate]; Methyldopa; Micardis [telmisartan]; Morphine and related; Nizatidine; Nsaids; Pantoprazole sodium; Penicillins; Prilosec [omeprazole]; Promethazine hcl; Streptomycin; Sulfa drugs cross reactors; Tequin; Tetracyclines & related; Toprol xl [metoprolol succinate]; Ultram [tramadol]; Valium; and Valturna [aliskiren-valsartan]  Family History  Problem Relation Age of Onset  . Hypertension Father   . Heart attack Father   . Heart failure Mother     Social History Social History  Substance Use Topics  . Smoking status: Never Smoker  . Smokeless tobacco: Never Used  . Alcohol use No    Review of Systems Constitutional: No fever/chills Eyes: No visual changes. ENT: No sore throat. Cardiovascular: Denies chest pain. Respiratory: Denies shortness of breath. Gastrointestinal: No  abdominal pain.  No nausea, no vomiting.  No diarrhea.  No constipation. Genitourinary: Negative for dysuria. Musculoskeletal: Negative for back pain. Skin: Negative for rash.  10-point ROS otherwise negative.  ____________________________________________   PHYSICAL EXAM:  VITAL SIGNS: ED Triage Vitals  Enc Vitals Group     BP 09/11/16 0739 (!) 156/72     Pulse Rate 09/11/16 0739 68     Resp 09/11/16 0739 16     Temp 09/11/16 0739 98.3 F (36.8 C)     Temp Source 09/11/16 0739 Oral     SpO2 09/11/16 0739 97 %     Weight 09/11/16 0739 101 lb (45.8 kg)     Height 09/11/16 0739 5' (1.524 m)     Head Circumference --      Peak Flow --      Pain Score 09/11/16 0738 10     Pain Loc --      Pain Edu? --      Excl. in [place]? --  Constitutional: Alert and oriented. Well appearing and in no acute distress. Eyes: Conjunctivae are normal. PERRL. EOMI. Head: Atraumatic. Nose: No congestion/rhinnorhea. Mouth/Throat: Mucous membranes are moist.  Oropharynx non-erythematous. Neck: No stridor.No cervical spine tenderness to palpation.Patient does complain of some pain with movement however Cardiovascular: Normal rate, regular rhythm. Grossly normal heart sounds.  Good peripheral circulation. Respiratory: Normal respiratory effort.  No retractions. Lungs CTAB. Nontender Gastrointestinal: Soft and nontender. No distention. No abdominal bruits. No CVA tenderness. Musculoskeletal: No lower extremity tenderness nor edema.  No joint effusions. No upper extremity tenderness either there is a skin tear that has been Steri-Stripped by the nursing home on the right lateral upper arm Neurologic:  Normal speech and language. No gross focal neurologic deficits are appreciated. Skin:  Skin is warm, dry and intact. No rash noted. Psychiatric: Mood and affect are normal. Speech and behavior are normal.  ____________________________________________   LABS (all labs ordered are listed, but only  abnormal results are displayed)  Labs Reviewed  CBC - Abnormal; Notable for the following:       Result Value   WBC 12.5 (*)    RBC 3.62 (*)    Hemoglobin 11.5 (*)    HCT 33.8 (*)    All other components within normal limits  BASIC METABOLIC PANEL - Abnormal; Notable for the following:    Sodium 134 (*)    CO2 20 (*)    Glucose, Bld 114 (*)    BUN 23 (*)    GFR calc non Af Amer 48 (*)    GFR calc Af Amer 55 (*)    All other components within normal limits   ____________________________________________  EKG  EKG read and interpreted by me shows sinus rhythm at a rate of 67 left axis is reading left anterior hemiblock otherwise do not see any changes. ____________________________________________  RADIOLOGY   ____________________________________________   PROCEDURES  Procedure(s) performed:   Procedures  Critical Care performed:   ____________________________________________   INITIAL IMPRESSION / ASSESSMENT AND PLAN / ED COURSE  Pertinent labs & imaging results that were available during my care of the patient were reviewed by me and considered in my medical decision making (see chart for details).  I discussed the patient in detail with Dr. Vertell Limber neurosurgery at [facility]. He reviewed the x-rays. He feels that it age 82 she is not a good surgical candidate. He feels she should do well in a c-collar. He wants to put the [place] collar on and follow her up in the office. I also discussed the patient with Dr. Marry Guan. He will follow-up the shoulder fracture. We will put her in a shoulder immobilizer.      ____________________________________________   FINAL CLINICAL IMPRESSION(S) / ED DIAGNOSES  Final diagnoses:  Fall, initial encounter  Closed fracture of proximal end of right humerus, unspecified fracture morphology, initial encounter  Closed displaced fracture of second cervical vertebra, unspecified fracture morphology, initial encounter ([place])       NEW MEDICATIONS STARTED DURING THIS VISIT:  New Prescriptions   No medications on file     Note:  This document was prepared using Dragon voice recognition software and may include unintentional dictation errors.    Nena Polio, MD 09/11/16 [phone]

## 2016-09-11 NOTE — Discharge Instructions (Signed)
I discussed her case with Dr. Vertell Limber who reviewed the CT scan. He feels she should do well in a c-collar. Wear this all the time until you see Dr. Vertell Limber. Also I discussed her with Dr. Marry Guan. He wants she can wear the shoulder immobilizer until you see him in the office. I would use Tylenol for pain. Return immediately if he gets numbness or tingling or weakness in your arms or legs or anywhere below the neck. Careful do not fall again

## 2016-09-14 DIAGNOSIS — S42291A Other displaced fracture of upper end of right humerus, initial encounter for closed fracture: Secondary | ICD-10-CM | POA: Diagnosis not present

## 2016-09-14 DIAGNOSIS — S12112B Nondisplaced Type II dens fracture, initial encounter for open fracture: Secondary | ICD-10-CM

## 2016-09-20 DIAGNOSIS — R293 Abnormal posture: Secondary | ICD-10-CM | POA: Diagnosis not present

## 2016-09-21 DIAGNOSIS — R293 Abnormal posture: Secondary | ICD-10-CM | POA: Diagnosis not present

## 2016-09-24 DIAGNOSIS — R293 Abnormal posture: Secondary | ICD-10-CM | POA: Diagnosis not present

## 2016-09-29 DIAGNOSIS — R293 Abnormal posture: Secondary | ICD-10-CM | POA: Diagnosis not present

## 2016-10-04 DIAGNOSIS — S129XXA Fracture of neck, unspecified, initial encounter: Secondary | ICD-10-CM | POA: Diagnosis not present

## 2016-10-04 DIAGNOSIS — F0390 Unspecified dementia without behavioral disturbance: Secondary | ICD-10-CM | POA: Diagnosis not present

## 2016-10-04 DIAGNOSIS — S12110A Anterior displaced Type II dens fracture, initial encounter for closed fracture: Secondary | ICD-10-CM | POA: Diagnosis not present

## 2016-10-04 DIAGNOSIS — M542 Cervicalgia: Secondary | ICD-10-CM | POA: Diagnosis not present

## 2016-10-13 DIAGNOSIS — S42291A Other displaced fracture of upper end of right humerus, initial encounter for closed fracture: Secondary | ICD-10-CM | POA: Diagnosis not present

## 2016-10-13 DIAGNOSIS — S42291D Other displaced fracture of upper end of right humerus, subsequent encounter for fracture with routine healing: Secondary | ICD-10-CM | POA: Diagnosis not present

## 2016-10-15 IMAGING — CR DG ABDOMEN 1V
1 series · 1 of 1 positions shown · non-contrast
Comparison: Lumbar spine series of April 09, 2010 and abdominal
and pelvic CT scan September 30, 2009.

CLINICAL DATA: Urinary retention, history of kidney stones.

EXAM:
ABDOMEN - 1 VIEW

[dg abd 1 view]
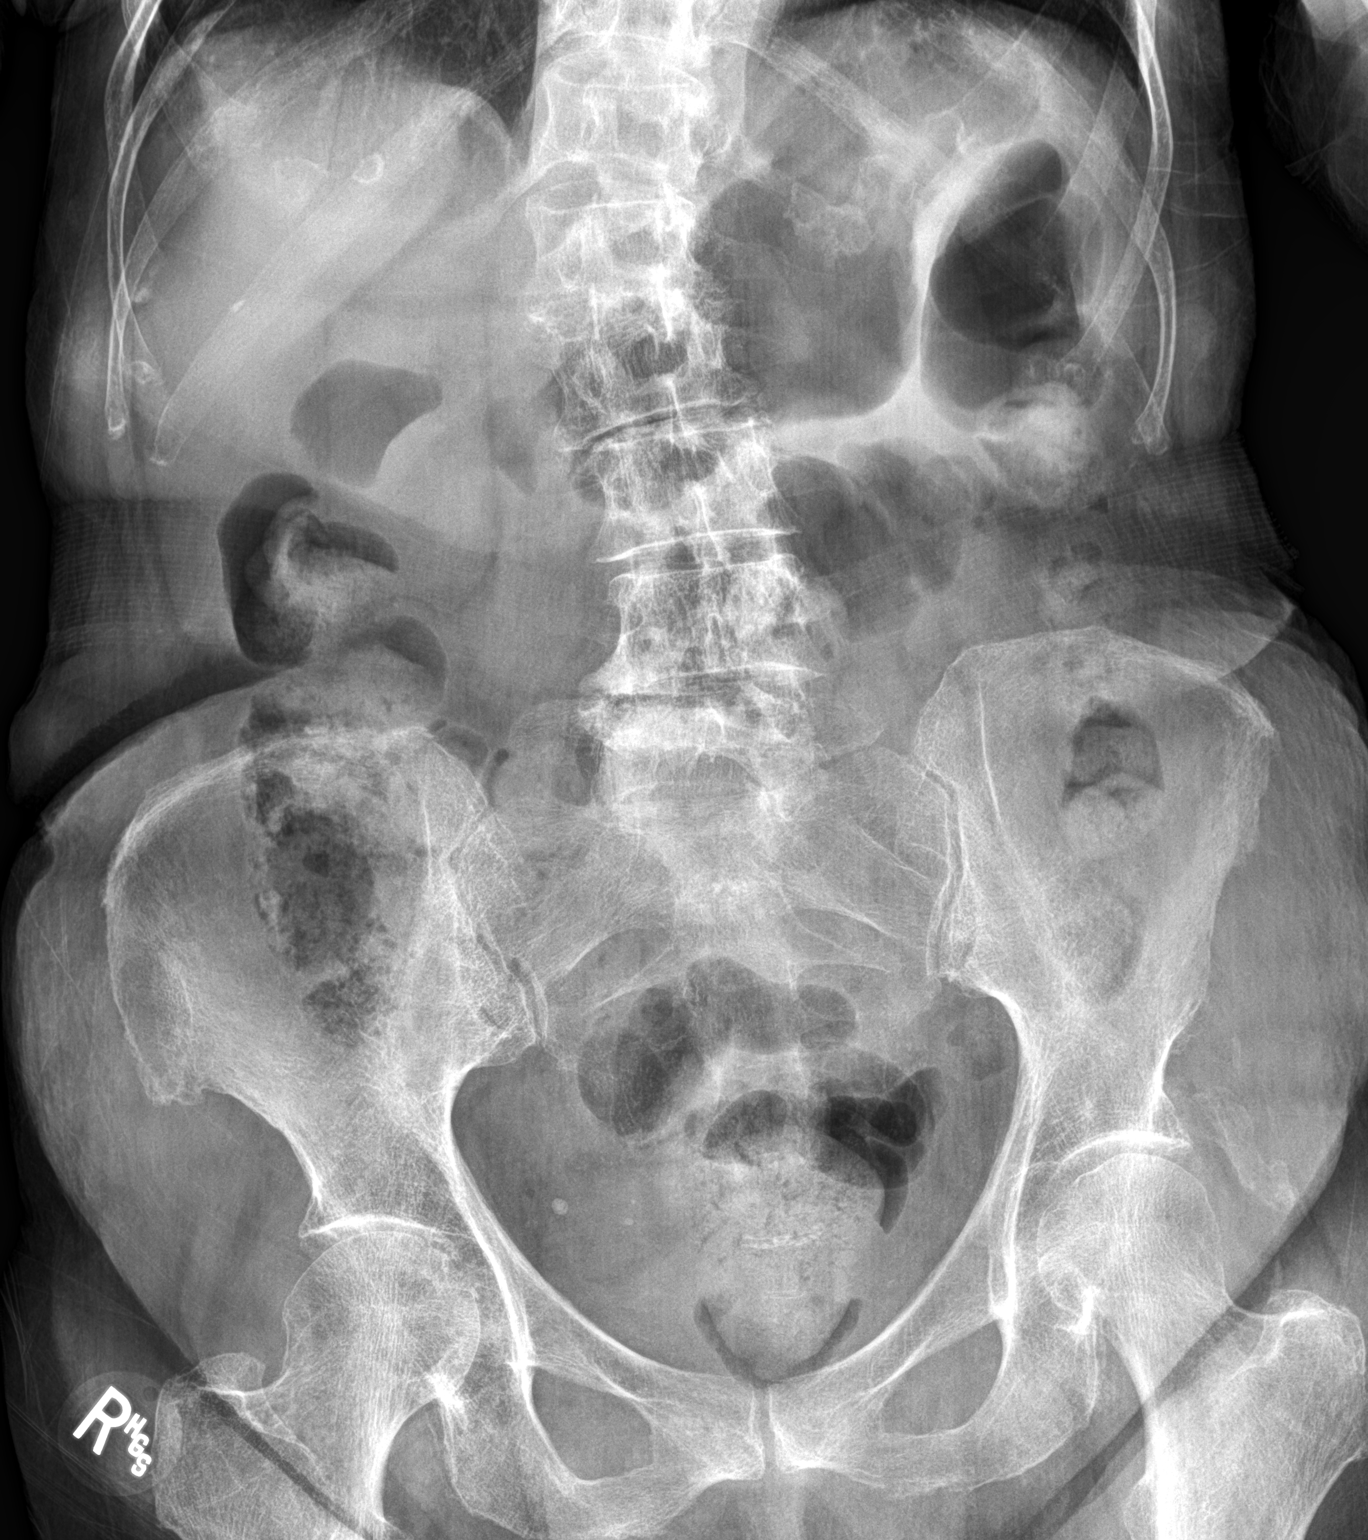

[1 of 1 positions shown; findings below may reference images not displayed]

FINDINGS: There is a moderate colonic stool burden. There is no small or large
bowel obstructive pattern. No abnormal calcifications are observed
overlying either kidney. There are phleboliths in the right aspect
of the true bony pelvis. There are degenerative changes of the
lumbar spine and right hip.
IMPRESSION: No definite urinary tract stones are observed. Mildly increased
colonic stool burden may reflect constipation in the appropriate
clinical setting.

## 2016-10-20 DIAGNOSIS — S42201A Unspecified fracture of upper end of right humerus, initial encounter for closed fracture: Secondary | ICD-10-CM | POA: Diagnosis not present

## 2016-10-20 DIAGNOSIS — F39 Unspecified mood [affective] disorder: Secondary | ICD-10-CM | POA: Diagnosis not present

## 2016-10-20 DIAGNOSIS — I10 Essential (primary) hypertension: Secondary | ICD-10-CM | POA: Diagnosis not present

## 2016-10-20 DIAGNOSIS — E43 Unspecified severe protein-calorie malnutrition: Secondary | ICD-10-CM | POA: Diagnosis not present

## 2016-10-20 DIAGNOSIS — S12110A Anterior displaced Type II dens fracture, initial encounter for closed fracture: Secondary | ICD-10-CM | POA: Diagnosis not present

## 2016-10-20 DIAGNOSIS — F0150 Vascular dementia without behavioral disturbance: Secondary | ICD-10-CM | POA: Diagnosis not present

## 2016-10-20 DIAGNOSIS — R000 Tachycardia, unspecified: Secondary | ICD-10-CM | POA: Diagnosis not present

## 2016-10-29 DIAGNOSIS — R278 Other lack of coordination: Secondary | ICD-10-CM | POA: Diagnosis not present

## 2016-10-29 DIAGNOSIS — R2681 Unsteadiness on feet: Secondary | ICD-10-CM | POA: Diagnosis not present

## 2016-11-01 DIAGNOSIS — R278 Other lack of coordination: Secondary | ICD-10-CM | POA: Diagnosis not present

## 2016-11-01 DIAGNOSIS — R2681 Unsteadiness on feet: Secondary | ICD-10-CM | POA: Diagnosis not present

## 2016-11-02 DIAGNOSIS — R2681 Unsteadiness on feet: Secondary | ICD-10-CM | POA: Diagnosis not present

## 2016-11-02 DIAGNOSIS — R278 Other lack of coordination: Secondary | ICD-10-CM | POA: Diagnosis not present

## 2016-11-03 DIAGNOSIS — S129XXA Fracture of neck, unspecified, initial encounter: Secondary | ICD-10-CM | POA: Diagnosis not present

## 2016-11-03 DIAGNOSIS — M542 Cervicalgia: Secondary | ICD-10-CM | POA: Diagnosis not present

## 2016-11-03 DIAGNOSIS — S12110A Anterior displaced Type II dens fracture, initial encounter for closed fracture: Secondary | ICD-10-CM | POA: Diagnosis not present

## 2016-11-03 DIAGNOSIS — R278 Other lack of coordination: Secondary | ICD-10-CM | POA: Diagnosis not present

## 2016-11-03 DIAGNOSIS — R2681 Unsteadiness on feet: Secondary | ICD-10-CM | POA: Diagnosis not present

## 2016-11-03 DIAGNOSIS — F0390 Unspecified dementia without behavioral disturbance: Secondary | ICD-10-CM | POA: Diagnosis not present

## 2016-11-04 DIAGNOSIS — R2681 Unsteadiness on feet: Secondary | ICD-10-CM | POA: Diagnosis not present

## 2016-11-04 DIAGNOSIS — R278 Other lack of coordination: Secondary | ICD-10-CM | POA: Diagnosis not present

## 2016-11-05 DIAGNOSIS — R278 Other lack of coordination: Secondary | ICD-10-CM | POA: Diagnosis not present

## 2016-11-05 DIAGNOSIS — R2681 Unsteadiness on feet: Secondary | ICD-10-CM | POA: Diagnosis not present

## 2016-11-08 DIAGNOSIS — R2681 Unsteadiness on feet: Secondary | ICD-10-CM | POA: Diagnosis not present

## 2016-11-08 DIAGNOSIS — R278 Other lack of coordination: Secondary | ICD-10-CM | POA: Diagnosis not present

## 2016-11-09 DIAGNOSIS — R278 Other lack of coordination: Secondary | ICD-10-CM | POA: Diagnosis not present

## 2016-11-09 DIAGNOSIS — R2681 Unsteadiness on feet: Secondary | ICD-10-CM | POA: Diagnosis not present

## 2016-11-10 DIAGNOSIS — R278 Other lack of coordination: Secondary | ICD-10-CM | POA: Diagnosis not present

## 2016-11-10 DIAGNOSIS — R2681 Unsteadiness on feet: Secondary | ICD-10-CM | POA: Diagnosis not present

## 2016-11-11 DIAGNOSIS — R2681 Unsteadiness on feet: Secondary | ICD-10-CM | POA: Diagnosis not present

## 2016-11-11 DIAGNOSIS — R278 Other lack of coordination: Secondary | ICD-10-CM | POA: Diagnosis not present

## 2016-11-12 DIAGNOSIS — R278 Other lack of coordination: Secondary | ICD-10-CM | POA: Diagnosis not present

## 2016-11-12 DIAGNOSIS — R2681 Unsteadiness on feet: Secondary | ICD-10-CM | POA: Diagnosis not present

## 2016-11-15 DIAGNOSIS — R278 Other lack of coordination: Secondary | ICD-10-CM | POA: Diagnosis not present

## 2016-11-15 DIAGNOSIS — R2681 Unsteadiness on feet: Secondary | ICD-10-CM | POA: Diagnosis not present

## 2016-11-16 DIAGNOSIS — R278 Other lack of coordination: Secondary | ICD-10-CM | POA: Diagnosis not present

## 2016-11-16 DIAGNOSIS — R2681 Unsteadiness on feet: Secondary | ICD-10-CM | POA: Diagnosis not present

## 2016-11-17 DIAGNOSIS — R2681 Unsteadiness on feet: Secondary | ICD-10-CM | POA: Diagnosis not present

## 2016-11-17 DIAGNOSIS — R278 Other lack of coordination: Secondary | ICD-10-CM | POA: Diagnosis not present

## 2016-11-18 DIAGNOSIS — R278 Other lack of coordination: Secondary | ICD-10-CM | POA: Diagnosis not present

## 2016-11-18 DIAGNOSIS — R2681 Unsteadiness on feet: Secondary | ICD-10-CM | POA: Diagnosis not present

## 2016-11-19 DIAGNOSIS — R2681 Unsteadiness on feet: Secondary | ICD-10-CM | POA: Diagnosis not present

## 2016-11-19 DIAGNOSIS — R278 Other lack of coordination: Secondary | ICD-10-CM | POA: Diagnosis not present

## 2016-11-22 DIAGNOSIS — R278 Other lack of coordination: Secondary | ICD-10-CM | POA: Diagnosis not present

## 2016-11-22 DIAGNOSIS — R2681 Unsteadiness on feet: Secondary | ICD-10-CM | POA: Diagnosis not present

## 2016-11-23 DIAGNOSIS — R2681 Unsteadiness on feet: Secondary | ICD-10-CM | POA: Diagnosis not present

## 2016-11-23 DIAGNOSIS — R278 Other lack of coordination: Secondary | ICD-10-CM | POA: Diagnosis not present

## 2016-12-06 ENCOUNTER — Ambulatory Visit (INDEPENDENT_AMBULATORY_CARE_PROVIDER_SITE_OTHER): Payer: Medicare Other | Admitting: Podiatry

## 2016-12-06 ENCOUNTER — Ambulatory Visit: Payer: Medicare Other | Admitting: Podiatry

## 2016-12-06 ENCOUNTER — Encounter: Payer: Self-pay | Admitting: Podiatry

## 2016-12-06 DIAGNOSIS — I10 Essential (primary) hypertension: Secondary | ICD-10-CM | POA: Diagnosis not present

## 2016-12-06 DIAGNOSIS — S12110A Anterior displaced Type II dens fracture, initial encounter for closed fracture: Secondary | ICD-10-CM | POA: Diagnosis not present

## 2016-12-06 DIAGNOSIS — B351 Tinea unguium: Secondary | ICD-10-CM

## 2016-12-06 DIAGNOSIS — M79609 Pain in unspecified limb: Secondary | ICD-10-CM | POA: Diagnosis not present

## 2016-12-06 DIAGNOSIS — M542 Cervicalgia: Secondary | ICD-10-CM | POA: Diagnosis not present

## 2016-12-06 DIAGNOSIS — F0390 Unspecified dementia without behavioral disturbance: Secondary | ICD-10-CM | POA: Diagnosis not present

## 2016-12-06 NOTE — Progress Notes (Signed)
Complaint:  Visit Type: Patient returns to my office for continued preventative foot care services. Complaint: Patient states" my nails have grown long and thick and become painful to walk and wear shoes" Patient has been diagnosed with CVA.. The patient presents for preventative foot care services. No changes to ROS  Podiatric Exam: Vascular: dorsalis pedis and posterior tibial pulses are barely  palpable bilateral. Capillary return is immediate. Cold feet.. Skin turgor WNL  Sensorium: Normal Semmes Weinstein monofilament test. Normal tactile sensation bilaterally. Nail Exam: Pt has thick disfigured discolored nails with subungual debris noted bilateral entire nail hallux through fifth toenails Ulcer Exam: There is no evidence of ulcer or pre-ulcerative changes or infection. Orthopedic Exam: Muscle tone and strength are WNL. No limitations in general ROM. No crepitus or effusions noted. Foot type and digits show no abnormalities. Bony prominences are unremarkable. Skin: No Porokeratosis. No infection or ulcers  Diagnosis:  Onychomycosis, , Pain in right toe, pain in left toes  Treatment & Plan Procedures and Treatment: Consent by patient was obtained for treatment procedures. The patient understood the discussion of treatment and procedures well. All questions were answered thoroughly reviewed. Debridement of mycotic and hypertrophic toenails, 1 through 5 bilateral and clearing of subungual debris. No ulceration, no infection noted.  She kept requesting shorter nails and they were already short.  Her daughter was embarassed. Return Visit-Office Procedure: Patient instructed to return to the office for a follow up visit 3 months for continued evaluation and treatment.    Gardiner Barefoot DPM

## 2016-12-09 ENCOUNTER — Ambulatory Visit: Payer: Medicare Other | Admitting: Podiatry

## 2016-12-09 ENCOUNTER — Encounter: Payer: Self-pay | Admitting: Emergency Medicine

## 2016-12-09 ENCOUNTER — Emergency Department
Admission: EM | Admit: 2016-12-09 | Discharge: 2016-12-10 | Disposition: A | Discharge status: Home / Self Care | Payer: Medicare Other | Attending: Emergency Medicine | Admitting: Emergency Medicine

## 2016-12-09 ENCOUNTER — Emergency Department: Payer: Medicare Other

## 2016-12-09 DIAGNOSIS — M542 Cervicalgia: Secondary | ICD-10-CM | POA: Diagnosis not present

## 2016-12-09 DIAGNOSIS — S12000K Unspecified displaced fracture of first cervical vertebra, subsequent encounter for fracture with nonunion: Secondary | ICD-10-CM | POA: Diagnosis not present

## 2016-12-09 DIAGNOSIS — Z79899 Other long term (current) drug therapy: Secondary | ICD-10-CM | POA: Insufficient documentation

## 2016-12-09 DIAGNOSIS — S12090K Other displaced fracture of first cervical vertebra, subsequent encounter for fracture with nonunion: Secondary | ICD-10-CM | POA: Insufficient documentation

## 2016-12-09 DIAGNOSIS — I10 Essential (primary) hypertension: Secondary | ICD-10-CM | POA: Insufficient documentation

## 2016-12-09 DIAGNOSIS — W19XXXD Unspecified fall, subsequent encounter: Secondary | ICD-10-CM | POA: Diagnosis not present

## 2016-12-09 DIAGNOSIS — S199XXD Unspecified injury of neck, subsequent encounter: Secondary | ICD-10-CM | POA: Diagnosis present

## 2016-12-09 HISTORY — DX: Cerebral infarction, unspecified: I63.9

## 2016-12-09 MED ORDER — HYDRALAZINE HCL 25 MG PO TABS
50.0000 mg | ORAL_TABLET | Freq: Once | ORAL | Status: AC
  Administered 2016-12-09: 50 mg via ORAL
  Filled 2016-12-09: qty 2

## 2016-12-09 MED ORDER — MIRTAZAPINE 15 MG PO TABS
30.0000 mg | ORAL_TABLET | Freq: Once | ORAL | Status: AC
  Administered 2016-12-09: 30 mg via ORAL
  Filled 2016-12-09: qty 2

## 2016-12-09 MED ORDER — HYDROCODONE-ACETAMINOPHEN 5-325 MG PO TABS
0.5000 | ORAL_TABLET | Freq: Once | ORAL | Status: AC
Start: 2016-12-09 — End: 2016-12-09
  Administered 2016-12-09: 0.5 via ORAL
  Filled 2016-12-09: qty 1

## 2016-12-09 MED ORDER — CITALOPRAM HYDROBROMIDE 20 MG PO TABS
20.0000 mg | ORAL_TABLET | Freq: Once | ORAL | Status: AC
  Administered 2016-12-09: 20 mg via ORAL
  Filled 2016-12-09: qty 1

## 2016-12-09 NOTE — ED Triage Notes (Signed)
Patient presents to the ED with severe neck pain suddenly this evening.  Patient had a C-1 fracture post fall around Easter and was in a hard neck collar for 3 months.  Patient was released from her neck collar on Monday and had been doing well without it until this evening.  Patient's daughter reports that patient was complaining of slight head and neck pain around 5:30 but patient reported to staff at [facility] her pain was "excrutiating" around pm.  Patient was placed back in her neck collar but patient states it is not helping her pain.  Patient is alert and oriented to self and place but not time.  Patient's daughter reports pain has slight confusion at baseline.  Patient is very talkative and moving her legs.  Patient denies pain in limbs, denies any vision changes.  Patient is complaining of a headache along with her neck pain.

## 2016-12-09 NOTE — ED Provider Notes (Signed)
[facility] Emergency Department Provider Note ____________________________________________  Time seen: 1937  I have reviewed the triage vital signs and the nursing notes.  HISTORY  Chief Complaint  Neck Pain  HPI [name] is a 83 y.o. female present to the ED accompanied by her daughter, from [facility] assisted living facility. Patient was visited by daughter about 5:00 pm this afternoon without any complaints. By 6:00 pm, [facility] had call to notify the daughter that they were transporting the patient to the ED for evaluation of acute neck pain. The patient has a history which is consistent with a closed C1 fracture from March 2018. Patient had an unwitnessed fall, that resulted in a C1 fracture. She was placed in a [place] cervical collar, and has had routine follow-ups with [facility]'s Dr. Vertell Limber, since the injury. Her most recent visit was on Monday, when Dr. Vertell Limber repeated plain films, to confirm stability and healing about the C1 fracture. The patient was cleared to remove her c-collar at that time. She was notified that she might have intermittent need for the c-collar due to some neck muscle weakness and pain. The daughter denies any interim injuries, accidents, falls, or trauma. She reports that the patient had some apprehension about not wearing the c-collar after being in the collar for so long. She otherwise denies any new injuries at this time. The patient is mildly demented according to the daughter and  has multiple medical allergies. She has history of cervical spine stenosis and fusion previously. Her medical history includes Intermittent headaches, paresthesias, hypertension.  Past Medical History:  Diagnosis Date  . Atypical chest pain    a. nuclear stress test 04/2010: normal; b. echo 2009: EF 55-60%, mild LVH, impaired LV relaxation, mild aortic sclerosis, mild TR, nl PASP, since echo 2007 no significant change   . Benign  hypertensive heart disease without heart failure 12/14/2010  . Dizziness   . Dyspepsia   . Fatigue   . Headaches, cluster   . Heart palpitations   . History of liver disease    history of liver trouble, has not had any known esposures to jaundiced person  . History of rheumatic fever    as a child  . History of spinal stenosis    underwent back surgery on 12/03/09 by Dr. Glenna Fellows  . Hypercholesterolemia    with inability to tolerate statin therapy  . Hyperlipidemia   . Hypertension    a. labile HTN  . Joint pain   . Medication side effects    a. 30+ medication allergies and/or intolerances; b. she will not take daily antihypertensives  . Mitral valve prolapse   . Muscle spasm   . Nausea and/or vomiting    with walking  . Numbness in feet   . 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 Shortness of breath   . Stroke ([place])   . Vertigo    positional vertigo  . Worsening vision     Patient Active Problem List   Diagnosis Date Noted  . Pyuria - likely UTI 09/15/2015  . Acute encephalopathy 09/15/2015  . Hypokalemia 09/15/2015  . Hyponatremia 09/15/2015  . Early Sepsis  09/15/2015  . Accelerated hypertension 09/15/2015  . Dysphagia 09/15/2015  . Depression 09/15/2015  . Altered mental status 09/11/2015  . Hypertensive urgency 09/11/2015  . Altered mental state 09/11/2015  . Delirium 09/11/2015  . Medication side effects   . Atypical chest pain   . Palpitations 09/25/2012  .  Labile blood pressure 09/15/2012  . ? Orthostatic hypotension 09/13/2012  . Gait instability 09/12/2012  . Headache(784.0) 09/12/2012  . Hypertension, accelerated 09/12/2012  . Nausea alone 06/28/2011  . Benign hypertensive heart disease without heart failure 12/14/2010  . Spinal stenosis of lumbar region 12/14/2010  . History of vertigo 12/14/2010  . Hypercholesterolemia 12/14/2010    Past Surgical History:  Procedure Laterality Date  . BLADDER FULGURATION    . BREAST BIOPSY      multiple  . CARPAL TUNNEL RELEASE     bilaterally  . CERVICAL FUSION    . CHOLECYSTECTOMY    . HEMORRHOID SURGERY    . LUMBAR LAMINECTOMY    . TONSILLECTOMY    . TOOTH EXTRACTION    . TOTAL ABDOMINAL HYSTERECTOMY    . TRIGGER FINGER RELEASE     x2    Prior to Admission medications   Medication Sig Start Date End Date Taking? Authorizing Provider  citalopram (CELEXA) 20 MG tablet Take 20 mg by mouth daily.     [provider]  digoxin (LANOXIN) 0.125 MG tablet Take 0.0625 mg by mouth See admin instructions. On Monday, Wednesday, and Friday    [provider]  digoxin (LANOXIN) 0.125 MG tablet Take 0.125 mg by mouth See admin instructions. On Tuesday, Thursday, Saturday, and Sunday    [provider]  hydrALAZINE (APRESOLINE) 50 MG tablet Take 50 mg by mouth 3 (three) times daily.  06/08/16   [provider]  mirtazapine (REMERON) 30 MG tablet  06/05/16   [provider]  nitroGLYCERIN (NITROSTAT) 0.4 MG SL tablet Place 0.4 mg under the tongue every 5 (five) minutes as needed for chest pain.    [provider]  Polyethyl Glycol-Propyl Glycol (SYSTANE ULTRA PF) 0.4-0.3 % SOLN Place 1 drop into both eyes 2 (two) times daily.    [provider]  Simethicone (GAS-X EXTRA STRENGTH) 125 MG CAPS Take 125 mg by mouth as needed (for gas relief).    [provider]  sodium chloride (OCEAN) 0.65 % nasal spray Place 1 spray into the nose as needed for congestion.    [provider]  triamcinolone (NASACORT ALLERGY 24HR) 55 MCG/ACT AERO nasal inhaler Place 2 sprays into the nose daily as needed (for allergies).     [provider]    Allergies Amlodipine; Aspirin; Bystolic [nebivolol hcl]; Calan [verapamil hcl]; Cardura [doxazosin mesylate]; Cephalosporins; Clonidine derivatives; Codeine; Cozaar; Doxazosin; Elavil [amitriptyline hcl]; Erythromycin; Gantrisin [sulfisoxazole]; Hctz [hydrochlorothiazide]; Iodides;  Lopressor [metoprolol tartrate]; Methyldopa; Micardis [telmisartan]; Morphine and related; Nizatidine; Nsaids; Pantoprazole sodium; Penicillins; Prilosec [omeprazole]; Promethazine hcl; Streptomycin; Sulfa drugs cross reactors; Tequin; Tetracyclines & related; Toprol xl [metoprolol succinate]; Ultram [tramadol]; Valium; and Valturna [aliskiren-valsartan]  Family History  Problem Relation Age of Onset  . Hypertension Father   . Heart attack Father   . Heart failure Mother     Social History Social History  Substance Use Topics  . Smoking status: Never Smoker  . Smokeless tobacco: Never Used  . Alcohol use No    Review of Systems  Constitutional: Negative for fever. Cardiovascular: Negative for chest pain. Respiratory: Negative for shortness of breath. Musculoskeletal: Negative for back pain. Neck pain as above. Skin: Negative for rash. Neurological: Negative for headaches, focal weakness or numbness. LUE paresthesias.  ____________________________________________  PHYSICAL EXAM:  VITAL SIGNS: ED Triage Vitals [12/09/16 1846]  Enc Vitals Group     BP (!) 163/85     Pulse Rate 83     Resp  18     Temp 98.4 F (36.9 C)     Temp Source Oral     SpO2 97 %     Weight 110 lb (49.9 kg)     Height 5\' 2"  (1.575 m)     Head Circumference      Peak Flow      Pain Score 10     Pain Loc      Pain Edu?      Excl. in [place]?     Constitutional: Alert and oriented. Well appearing and in no distress. Patient is active, engaged, talkative, and pleasant.  Head: Normocephalic and atraumatic. Eyes: Conjunctivae are normal. Normal extraocular movements Neck: Supple. No thyromegaly. C-collar in place Cardiovascular: Normal rate, regular rhythm. Normal distal pulses. Respiratory: Normal respiratory effort. No wheezes/rales/rhonchi. Gastrointestinal: Soft and nontender. No distention. Musculoskeletal: Normal composite fists bilaterally.  Nontender with normal range of motion in all  extremities.  Neurologic:  CN II-XII grossly intact. Normal UE DTRs bilaterally. Normal speech and language. No gross focal neurologic deficits are appreciated. Skin:  Skin is warm, dry and intact. No rash noted. ____________________________________________   RADIOLOGY  CT Cervical Spine  IMPRESSION: 1. Re- demonstrated type 2 dens fracture of C2. There is nonunion of fracture. 3 mm anterolisthesis of C2 on C3 may be minimally increased compared to the prior CT. Alignment is otherwise stable. 2. Prominent slightly dense tissues around the odontoid could relate to inflammatory pannus or posttraumatic changes, the appearance does not appear significantly changed 3. Bony fusion from C3 through C6 with marked narrowing/partial fusion C6-C7. Multilevel degenerative changes. ____________________________________________  PROCEDURES  Cervical Collar replaced Norco 5-325 mg 0.5 tab PO Citalopram 20 mg PO Mirtazapine 30 mg PO Hydralazine 50 mg PO ____________________________________________  INITIAL IMPRESSION / ASSESSMENT AND PLAN / ED COURSE  ----------------------------------------- 10:55 PM on 12/09/2016 ----------------------------------------- ----------------------------------------- 11:46 PM on 12/10/2016 -----------------------------------------  Call to [facility] Call Service Dr. Saintclair Halsted: No Call back  Patient with nonunion of a C1 fracture 3 months status post injury. She is placed back in a rigid, foam cervical collar for support. She is otherwise stable and without any acute neuromuscular deficits. She will follow-up with Dr. Vertell Limber or Dr. Saintclair Halsted at [facility] for further fracture care, tomorrow. She is discharged with a prescription for Norco 5-325 mg PO for pain relief. Her daughter agrees with the plan of care.  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

## 2016-12-09 NOTE — ED Triage Notes (Signed)
Pt here from [facility] with daughter with c/o extreme neck pain. Pt had C1 fracture dx in march, was able to come out of hard collar on Monday. Pt's daughter reports she was visiting with patient at 1700 today and pt had no complains. [facility] called her around 1800 and said they were going to transport pt to hospital due to neck pain. Pt did receive 650mg  of Tylenol.

## 2016-12-10 DIAGNOSIS — S12100A Unspecified displaced fracture of second cervical vertebra, initial encounter for closed fracture: Secondary | ICD-10-CM | POA: Diagnosis not present

## 2016-12-10 MED ORDER — HYDROCODONE-ACETAMINOPHEN 5-325 MG PO TABS: 1.0000 | ORAL_TABLET | Freq: Three times a day (TID) | ORAL | 0 refills | Status: DC | PRN

## 2016-12-10 NOTE — Discharge Instructions (Signed)
Your CT scan reveals non-union of your C1 Fracture. This will require you to wear the cervical collar until your are evaluated and cleared again by Drs. Vertell Limber or Saintclair Halsted. Take the pain medicine as needed for moderate pain.

## 2016-12-17 DIAGNOSIS — J069 Acute upper respiratory infection, unspecified: Secondary | ICD-10-CM | POA: Diagnosis not present

## 2016-12-20 DIAGNOSIS — R05 Cough: Secondary | ICD-10-CM | POA: Diagnosis not present

## 2016-12-20 DIAGNOSIS — J181 Lobar pneumonia, unspecified organism: Secondary | ICD-10-CM | POA: Diagnosis not present

## 2016-12-20 DIAGNOSIS — R0602 Shortness of breath: Secondary | ICD-10-CM | POA: Diagnosis not present

## 2017-01-17 DIAGNOSIS — S12110A Anterior displaced Type II dens fracture, initial encounter for closed fracture: Secondary | ICD-10-CM | POA: Diagnosis not present

## 2017-01-17 DIAGNOSIS — M542 Cervicalgia: Secondary | ICD-10-CM | POA: Diagnosis not present

## 2017-01-17 DIAGNOSIS — F0390 Unspecified dementia without behavioral disturbance: Secondary | ICD-10-CM | POA: Diagnosis not present

## 2017-01-17 DIAGNOSIS — I10 Essential (primary) hypertension: Secondary | ICD-10-CM | POA: Diagnosis not present

## 2017-01-17 DIAGNOSIS — S129XXA Fracture of neck, unspecified, initial encounter: Secondary | ICD-10-CM | POA: Diagnosis not present

## 2017-02-17 DIAGNOSIS — R51 Headache: Secondary | ICD-10-CM | POA: Diagnosis not present

## 2017-02-24 DIAGNOSIS — F0280 Dementia in other diseases classified elsewhere without behavioral disturbance: Secondary | ICD-10-CM | POA: Diagnosis not present

## 2017-02-24 DIAGNOSIS — G301 Alzheimer's disease with late onset: Secondary | ICD-10-CM | POA: Diagnosis not present

## 2017-02-24 DIAGNOSIS — M5481 Occipital neuralgia: Secondary | ICD-10-CM | POA: Diagnosis not present

## 2017-02-25 DIAGNOSIS — M5481 Occipital neuralgia: Secondary | ICD-10-CM | POA: Diagnosis not present

## 2017-03-03 ENCOUNTER — Ambulatory Visit (INDEPENDENT_AMBULATORY_CARE_PROVIDER_SITE_OTHER): Payer: Medicare Other | Admitting: Podiatry

## 2017-03-03 ENCOUNTER — Encounter: Payer: Self-pay | Admitting: Podiatry

## 2017-03-03 DIAGNOSIS — B351 Tinea unguium: Secondary | ICD-10-CM | POA: Diagnosis not present

## 2017-03-03 DIAGNOSIS — M79609 Pain in unspecified limb: Principal | ICD-10-CM

## 2017-03-03 DIAGNOSIS — M79676 Pain in unspecified toe(s): Secondary | ICD-10-CM | POA: Diagnosis not present

## 2017-03-03 NOTE — Progress Notes (Signed)
Complaint:  Visit Type: Patient returns to my office for continued preventative foot care services. Complaint: Patient states" my nails have grown long and thick and become painful to walk and wear shoes" Patient has been diagnosed with CVA.. The patient presents for preventative foot care services. No changes to ROS  Podiatric Exam: Vascular: dorsalis pedis and posterior tibial pulses are barely  palpable bilateral. Capillary return is immediate. Cold feet.. Skin turgor WNL  Sensorium: Normal Semmes Weinstein monofilament test. Normal tactile sensation bilaterally. Nail Exam: Pt has thick disfigured discolored nails with subungual debris noted bilateral entire nail hallux through fifth toenails Ulcer Exam: There is no evidence of ulcer or pre-ulcerative changes or infection. Orthopedic Exam: Muscle tone and strength are WNL. No limitations in general ROM. No crepitus or effusions noted. Foot type and digits show no abnormalities. Bony prominences are unremarkable. Skin: No Porokeratosis. No infection or ulcers  Diagnosis:  Onychomycosis, , Pain in right toe, pain in left toes  Treatment & Plan Procedures and Treatment: Consent by patient was obtained for treatment procedures. The patient understood the discussion of treatment and procedures well. All questions were answered thoroughly reviewed. Debridement of mycotic and hypertrophic toenails, 1 through 5 bilateral and clearing of subungual debris. No ulceration, no infection noted.   Return Visit-Office Procedure: Patient instructed to return to the office for a follow up visit 3 months for continued evaluation and treatment.    Gardiner Barefoot DPM

## 2017-03-07 ENCOUNTER — Ambulatory Visit: Payer: Medicare Other | Admitting: Podiatry

## 2017-04-15 DIAGNOSIS — R630 Anorexia: Secondary | ICD-10-CM

## 2017-04-15 DIAGNOSIS — F0150 Vascular dementia without behavioral disturbance: Secondary | ICD-10-CM

## 2017-04-15 DIAGNOSIS — F39 Unspecified mood [affective] disorder: Secondary | ICD-10-CM

## 2017-04-15 DIAGNOSIS — I10 Essential (primary) hypertension: Secondary | ICD-10-CM

## 2017-04-29 DIAGNOSIS — M5481 Occipital neuralgia: Secondary | ICD-10-CM | POA: Diagnosis not present

## 2017-04-29 DIAGNOSIS — G301 Alzheimer's disease with late onset: Secondary | ICD-10-CM | POA: Diagnosis not present

## 2017-04-29 DIAGNOSIS — F0280 Dementia in other diseases classified elsewhere without behavioral disturbance: Secondary | ICD-10-CM | POA: Diagnosis not present

## 2017-05-11 DIAGNOSIS — B351 Tinea unguium: Secondary | ICD-10-CM | POA: Diagnosis not present

## 2017-05-23 DIAGNOSIS — R05 Cough: Secondary | ICD-10-CM | POA: Diagnosis not present

## 2017-06-02 ENCOUNTER — Ambulatory Visit: Payer: Medicare Other | Admitting: Podiatry

## 2017-06-15 ENCOUNTER — Telehealth: Payer: Self-pay | Admitting: Internal Medicine

## 2017-06-15 NOTE — Telephone Encounter (Signed)
Not appropriate for AWV. Pt lives at [facility]

## 2017-06-27 DIAGNOSIS — M159 Polyosteoarthritis, unspecified: Secondary | ICD-10-CM

## 2017-06-27 DIAGNOSIS — I10 Essential (primary) hypertension: Secondary | ICD-10-CM | POA: Diagnosis not present

## 2017-06-27 DIAGNOSIS — F0150 Vascular dementia without behavioral disturbance: Secondary | ICD-10-CM | POA: Diagnosis not present

## 2017-06-27 DIAGNOSIS — E441 Mild protein-calorie malnutrition: Secondary | ICD-10-CM | POA: Diagnosis not present

## 2017-06-27 DIAGNOSIS — F39 Unspecified mood [affective] disorder: Secondary | ICD-10-CM | POA: Diagnosis not present

## 2017-06-28 ENCOUNTER — Telehealth: Payer: Self-pay | Admitting: Cardiovascular Disease

## 2017-06-28 NOTE — Telephone Encounter (Signed)
Patient daughter [name] stopped by Patient is due for a f/u - says patient is not even taking medication anymore so does not feel a f/u would do much good Daughter would like to speak with a nurse about whether or not there is much concern for patient to come in

## 2017-06-29 NOTE — Telephone Encounter (Signed)
Patients daughter states that she is refusing all medications and she has been in a facility for a few years. She stated that she could bring her in for appointment if needed but she really didn't feel as if we could really do anything. Advised that I would make Dr. Rockey Situ aware and to just give [place] a call if she should need anything.

## 2017-06-29 NOTE — Telephone Encounter (Signed)
Left voicemail message to call back  

## 2017-07-25 DIAGNOSIS — M25511 Pain in right shoulder: Secondary | ICD-10-CM

## 2017-07-25 DIAGNOSIS — R202 Paresthesia of skin: Secondary | ICD-10-CM | POA: Diagnosis not present

## 2017-08-03 DIAGNOSIS — H1089 Other conjunctivitis: Secondary | ICD-10-CM | POA: Diagnosis not present

## 2017-08-03 DIAGNOSIS — R05 Cough: Secondary | ICD-10-CM | POA: Diagnosis not present

## 2017-08-12 DIAGNOSIS — F39 Unspecified mood [affective] disorder: Secondary | ICD-10-CM | POA: Diagnosis not present

## 2017-08-12 DIAGNOSIS — E43 Unspecified severe protein-calorie malnutrition: Secondary | ICD-10-CM | POA: Diagnosis not present

## 2017-08-12 DIAGNOSIS — M1990 Unspecified osteoarthritis, unspecified site: Secondary | ICD-10-CM | POA: Diagnosis not present

## 2017-08-12 DIAGNOSIS — I10 Essential (primary) hypertension: Secondary | ICD-10-CM | POA: Diagnosis not present

## 2017-08-12 DIAGNOSIS — F0150 Vascular dementia without behavioral disturbance: Secondary | ICD-10-CM | POA: Diagnosis not present

## 2017-09-02 ENCOUNTER — Emergency Department: Payer: Medicare Other

## 2017-09-02 ENCOUNTER — Emergency Department
Admission: EM | Admit: 2017-09-02 | Discharge: 2017-09-03 | Disposition: A | Discharge status: Home / Self Care | Payer: Medicare Other | Attending: Emergency Medicine | Admitting: Emergency Medicine

## 2017-09-02 ENCOUNTER — Other Ambulatory Visit: Payer: Self-pay

## 2017-09-02 DIAGNOSIS — E7800 Pure hypercholesterolemia, unspecified: Secondary | ICD-10-CM | POA: Diagnosis not present

## 2017-09-02 DIAGNOSIS — R451 Restlessness and agitation: Secondary | ICD-10-CM | POA: Insufficient documentation

## 2017-09-02 DIAGNOSIS — I10 Essential (primary) hypertension: Secondary | ICD-10-CM | POA: Insufficient documentation

## 2017-09-02 DIAGNOSIS — R4182 Altered mental status, unspecified: Secondary | ICD-10-CM | POA: Diagnosis present

## 2017-09-02 DIAGNOSIS — Z79899 Other long term (current) drug therapy: Secondary | ICD-10-CM | POA: Insufficient documentation

## 2017-09-02 DIAGNOSIS — E785 Hyperlipidemia, unspecified: Secondary | ICD-10-CM | POA: Insufficient documentation

## 2017-09-02 DIAGNOSIS — Z8673 Personal history of transient ischemic attack (TIA), and cerebral infarction without residual deficits: Secondary | ICD-10-CM | POA: Insufficient documentation

## 2017-09-02 DIAGNOSIS — B351 Tinea unguium: Secondary | ICD-10-CM

## 2017-09-02 LAB — URINALYSIS, COMPLETE (UACMP) WITH MICROSCOPIC
Bacteria, UA: NONE SEEN
Bilirubin Urine: NEGATIVE
Glucose, UA: NEGATIVE mg/dL
HGB URINE DIPSTICK: NEGATIVE
Ketones, ur: NEGATIVE mg/dL
LEUKOCYTES UA: NEGATIVE
Nitrite: NEGATIVE
PH: 5 (ref 5.0–8.0)
Protein, ur: NEGATIVE mg/dL
SQUAMOUS EPITHELIAL / LPF: NONE SEEN
Specific Gravity, Urine: 1.019 (ref 1.005–1.030)

## 2017-09-02 LAB — COMPREHENSIVE METABOLIC PANEL
ALK PHOS: 102 U/L (ref 38–126)
ALT: 20 U/L (ref 14–54)
AST: 25 U/L (ref 15–41)
Albumin: 3.3 g/dL — ABNORMAL LOW (ref 3.5–5.0)
Anion gap: 8 (ref 5–15)
BILIRUBIN TOTAL: 0.4 mg/dL (ref 0.3–1.2)
BUN: 28 mg/dL — AB (ref 6–20)
CALCIUM: 9.2 mg/dL (ref 8.9–10.3)
CHLORIDE: 107 mmol/L (ref 101–111)
CO2: 22 mmol/L (ref 22–32)
CREATININE: 0.71 mg/dL (ref 0.44–1.00)
Glucose, Bld: 108 mg/dL — ABNORMAL HIGH (ref 65–99)
Potassium: 4 mmol/L (ref 3.5–5.1)
Sodium: 137 mmol/L (ref 135–145)
TOTAL PROTEIN: 6.3 g/dL — AB (ref 6.5–8.1)

## 2017-09-02 LAB — CBC
HCT: 39.9 % (ref 35.0–47.0)
Hemoglobin: 13.4 g/dL (ref 12.0–16.0)
MCH: 31.5 pg (ref 26.0–34.0)
MCHC: 33.6 g/dL (ref 32.0–36.0)
MCV: 93.6 fL (ref 80.0–100.0)
PLATELETS: 253 10*3/uL (ref 150–440)
RBC: 4.27 MIL/uL (ref 3.80–5.20)
RDW: 14.6 % — AB (ref 11.5–14.5)
WBC: 13.9 10*3/uL — AB (ref 3.6–11.0)

## 2017-09-02 NOTE — ED Provider Notes (Signed)
[facility] Emergency Department Provider Note  Time seen: 9:43 PM  I have reviewed the triage vital signs and the nursing notes.   HISTORY  Chief Complaint Altered Mental Status and Agitation    HPI [name] is a 84 y.o. female with a past medical history of memory loss, hypertension, hyperlipidemia, likely CVA, nonambulatory at baseline who presents to the emergency department for altered mental status.  According to the daughter for the past 2 days the patient has intermittently appeared more confused than normal and today is been having trouble speaking.  States she attempts to say what she wants to say but her words come out jumbled.  Patient is unable to provide an accurate/adequate history.  Unable to provide an adequate/accurate review of systems.   Past Medical History:  Diagnosis Date  . Atypical chest pain    a. nuclear stress test 04/2010: normal; b. echo 2009: EF 55-60%, mild LVH, impaired LV relaxation, mild aortic sclerosis, mild TR, nl PASP, since echo 2007 no significant change   . Benign hypertensive heart disease without heart failure 12/14/2010  . Dizziness   . Dyspepsia   . Fatigue   . Headaches, cluster   . Heart palpitations   . History of liver disease    history of liver trouble, has not had any known esposures to jaundiced person  . History of rheumatic fever    as a child  . History of spinal stenosis    underwent back surgery on 12/03/09 by Dr. Glenna Fellows  . Hypercholesterolemia    with inability to tolerate statin therapy  . Hyperlipidemia   . Hypertension    a. labile HTN  . Joint pain   . Medication side effects    a. 30+ medication allergies and/or intolerances; b. she will not take daily antihypertensives  . Mitral valve prolapse   . Muscle spasm   . Nausea and/or vomiting    with walking  . Numbness in feet   . 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 Shortness of breath   . Stroke ([place])   .  Vertigo    positional vertigo  . Worsening vision     Patient Active Problem List   Diagnosis Date Noted  . Pyuria - likely UTI 09/15/2015  . Acute encephalopathy 09/15/2015  . Hypokalemia 09/15/2015  . Hyponatremia 09/15/2015  . Early Sepsis  09/15/2015  . Accelerated hypertension 09/15/2015  . Dysphagia 09/15/2015  . Depression 09/15/2015  . Altered mental status 09/11/2015  . Hypertensive urgency 09/11/2015  . Altered mental state 09/11/2015  . Delirium 09/11/2015  . Medication side effects   . Atypical chest pain   . Palpitations 09/25/2012  . Labile blood pressure 09/15/2012  . ? Orthostatic hypotension 09/13/2012  . Gait instability 09/12/2012  . Headache(784.0) 09/12/2012  . Hypertension, accelerated 09/12/2012  . Nausea alone 06/28/2011  . Benign hypertensive heart disease without heart failure 12/14/2010  . Spinal stenosis of lumbar region 12/14/2010  . History of vertigo 12/14/2010  . Hypercholesterolemia 12/14/2010    Past Surgical History:  Procedure Laterality Date  . BLADDER FULGURATION    . BREAST BIOPSY     multiple  . CARPAL TUNNEL RELEASE     bilaterally  . CERVICAL FUSION    . CHOLECYSTECTOMY    . HEMORRHOID SURGERY    . LUMBAR LAMINECTOMY    . TONSILLECTOMY    . TOOTH EXTRACTION    . TOTAL ABDOMINAL HYSTERECTOMY    .  TRIGGER FINGER RELEASE     x2    Prior to Admission medications   Medication Sig Start Date End Date Taking? Authorizing Provider  acetaminophen (TYLENOL) 500 MG tablet Take 500 mg by mouth 2 (two) times daily as needed.   Yes [provider]  aluminum hydroxide (DERMAGRAN) ointment Apply 1 application topically as needed.   Yes [provider]  HYDROcodone-acetaminophen (NORCO/VICODIN) 5-325 MG tablet Take 0.5 tablets by mouth every 4 (four) hours as needed for moderate pain.   Yes [provider]  hydroxypropyl methylcellulose / hypromellose (ISOPTO TEARS / GONIOVISC) 2.5 % ophthalmic solution Place  1-2 drops into the left eye 3 (three) times daily as needed for dry eyes.   Yes [provider]  mirtazapine (REMERON) 15 MG tablet Take 15 mg by mouth daily at 12 noon.    Yes [provider]  nitroGLYCERIN (NITROSTAT) 0.4 MG SL tablet Place 0.4 mg under the tongue every 5 (five) minutes as needed for chest pain.   Yes [provider]  nystatin (MYCOSTATIN/NYSTOP) powder Apply 1 g topically 2 (two) times daily.   Yes [provider]  ondansetron (ZOFRAN) 4 MG tablet Take 4 mg by mouth every 8 (eight) hours as needed for nausea or vomiting.   Yes [provider]  Polyethyl Glycol-Propyl Glycol (SYSTANE ULTRA PF) 0.4-0.3 % SOLN Place 1 drop into both eyes daily.    Yes [provider]  sennosides-docusate sodium (SENOKOT-S) 8.6-50 MG tablet Take 1 tablet by mouth 2 (two) times daily.   Yes [provider]  sodium chloride (OCEAN) 0.65 % nasal spray Place 1 spray into the nose as needed for congestion.   Yes [provider]  sodium fluoride (DENTA 5000 PLUS) 1.1 % CREA dental cream Place 1 application onto teeth every evening.   Yes [provider]  triamcinolone (NASACORT ALLERGY 24HR) 55 MCG/ACT AERO nasal inhaler Place 2 sprays into the nose daily as needed.    Yes [provider]  digoxin (LANOXIN) 0.125 MG tablet Take 0.0625 mg by mouth See admin instructions. On Monday, Wednesday, and Friday    [provider]  digoxin (LANOXIN) 0.125 MG tablet Take 0.125 mg by mouth See admin instructions. On Tuesday, Thursday, Saturday, and Sunday    [provider]    Allergies  Allergen Reactions  . Amlodipine Nausea Only  . Aspirin   . Bystolic [Nebivolol Hcl]     Vision problem  . Calan [Verapamil Hcl]     Weakness and staggering  . Calan [Verapamil]   . Cardura [Doxazosin Mesylate]   . Cephalosporins   . Clonidine Derivatives     Facial swelling   . Codeine   . Cozaar   . Doxazosin   .  Elavil [Amitriptyline Hcl]   . Erythromycin   . Gantrisin [Sulfisoxazole]   . Hctz [Hydrochlorothiazide]   . Iodides   . Levaquin [Levofloxacin]   . Lopressor [Metoprolol Tartrate]   . Methyldopa     Facial swelling   . Micardis [Telmisartan]   . Morphine And Related   . Nizatidine   . Nsaids Other (See Comments)  . Pantoprazole Sodium   . Penicillins   . Prilosec [Omeprazole]   . Promethazine Hcl   . Streptomycin   . Sulfa Antibiotics Other (See Comments)  . Sulfa Drugs Cross Reactors   . Tequin   . Tetracyclines & Related Other (See Comments)  . Toprol Xl [Metoprolol Succinate]   . Ultram [Tramadol]   .  Valium   . Valturna [Aliskiren-Valsartan] Nausea And Vomiting    Family History  Problem Relation Age of Onset  . Hypertension Father   . Heart attack Father   . Heart failure Mother     Social History Social History   Tobacco Use  . Smoking status: Never Smoker  . Smokeless tobacco: Never Used  Substance Use Topics  . Alcohol use: No    Alcohol/week: 0.0 oz  . Drug use: No    Review of Systems Unable to obtain an adequate/accurate review of systems due to confusion and short-term memory impairment  ____________________________________________   PHYSICAL EXAM:  VITAL SIGNS: ED Triage Vitals  Enc Vitals Group     BP 09/02/17 2010 (!) 175/69     Pulse --      Resp 09/02/17 2010 17     Temp 09/02/17 2010 98 F (36.7 C)     Temp Source 09/02/17 2010 Oral     SpO2 09/02/17 2010 94 %     Weight 09/02/17 2012 120 lb (54.4 kg)     Height 09/02/17 2012 5\' 3"  (1.6 m)     Head Circumference --      Peak Flow --      Pain Score --      Pain Loc --      Pain Edu? --      Excl. in [place]? --    Constitutional: Alert, lying in bed, no distress.  Will attempt to answer questions but difficult to understand and largely inaccurate. Eyes: Normal exam ENT   Head: Normocephalic and atraumatic   Mouth/Throat: Mucous membranes are moist. Cardiovascular:  Normal rate, regular rhythm.  Respiratory: Normal respiratory effort without tachypnea nor retractions. Breath sounds are clear Gastrointestinal: Soft and nontender. No distention.   Musculoskeletal: Patient is able to move all extremities spontaneously.  No lower extremity tenderness or edema. Neurologic: Patient will occasionally speak, but does come out is fairly normal with occasional and intelligible word.  No slurring of speech.  Daughter states her speech is abnormal compared to her baseline.  Patient is able to move all extremities.  No obvious cranial nerve deficit/facial droop. Skin:  Skin is warm, dry and intact.  Psychiatric: Mood and affect are normal.   ____________________________________________    EKG  EKG reviewed and interpreted by myself shows sinus rhythm 84 bpm with a slightly widened QRS, left axis deviation, largely normal intervals, nonspecific ST changes without ST elevation.  ____________________________________________    RADIOLOGY  CT head negative for acute abnormality Chest x-ray negative  ____________________________________________   INITIAL IMPRESSION / ASSESSMENT AND PLAN / ED COURSE  Pertinent labs & imaging results that were available during my care of the patient were reviewed by me and considered in my medical decision making (see chart for details).  Patient presents to the emergency department with altered mental status per family over the past 2 days and difficulty speaking.  Differential would include CVA, TIA, infectious etiologies such as pneumonia or UTI, metabolic or electrolyte abnormality.  We will check labs, urinalysis, CT scan of the head, chest x-ray and continue to closely monitor.  Family agreeable to this plan of care.   CT head and chest x-ray are negative for acute abnormality.  I discussed with the family there still remains a possibility of stroke given her word finding difficulty and somewhat increased confusion.  I  discussed the possibility of an MRI, daughter did not believe the patient would be able to sit still  for an MRI, and then we had a discussion as far as what would we do guarding the findings of a small stroke.  Ultimately family decided it would be best to get her back to her nursing facility and they will continue to monitor her.  Currently she appears well, no distress, no complaints is calm and cooperative. ____________________________________________   FINAL CLINICAL IMPRESSION(S) / ED DIAGNOSES  Altered mental status    Harvest Dark, MD 09/02/17 2251

## 2017-09-02 NOTE — ED Notes (Signed)
Pt to be d/c. EMS called.

## 2017-09-02 NOTE — ED Notes (Signed)
Pt laying in bed. Pt is fidgeting with blankets and cords. Family at bedside. Per daughter, aggitation began 2 days ago, confusion and not speaking at baseline began today. Pt took a hydrocodone PRN today but it normally has no effect on her. Pt is able to answer questions but answers are not all consistent. CT complete. Pt unable to sit still for chest xray.

## 2017-09-02 NOTE — ED Triage Notes (Signed)
Pt bib [facility] from [facility] for reports of increasing agitation and AMS for 2 days. VSS in route, CBG 130. Patient daughter advised facility to send her for eval. Patient alert to self

## 2017-09-02 NOTE — ED Notes (Signed)
Pt uncooperative with Odette Horns, MD aware.

## 2017-09-02 NOTE — Discharge Instructions (Addendum)
You have been seen in the emergency department today for altered mental status.  Your workup including blood work, urinalysis, chest x-ray and CT scan of the head appear normal.  Please follow-up with your doctor in the next several days for recheck/reevaluation.  Return to the emergency department for any worsening confusion, or any other symptom personally concerning to yourselves.

## 2017-09-13 ENCOUNTER — Telehealth: Payer: Self-pay | Admitting: Internal Medicine

## 2017-09-13 NOTE — Telephone Encounter (Signed)
[facility] paperwork In dr Silvio Pate In box for review and signature

## 2017-09-14 DIAGNOSIS — Z0279 Encounter for issue of other medical certificate: Secondary | ICD-10-CM

## 2017-09-14 NOTE — Telephone Encounter (Signed)
Clarise Cruz aware Copy for file Copy for scan Copy for billing

## 2017-09-14 NOTE — Telephone Encounter (Signed)
Form done $20 charge 

## 2017-09-14 NOTE — Telephone Encounter (Signed)
Paperwork faxed °

## 2017-10-06 NOTE — Telephone Encounter (Signed)
I don't know the dates either Please call Nevin Bloodgood at [facility]--- [phone] and she can look them up

## 2017-10-06 NOTE — Telephone Encounter (Signed)
[employer] faxed paperwork needing additional information Question part a question 1 Dates you treated the patient for condition. Pt in skilled nursing I don't have those dates  Paperwork in dr letvak's in box

## 2017-10-06 NOTE — Telephone Encounter (Signed)
Left message asking [name] to call office

## 2017-10-10 NOTE — Telephone Encounter (Signed)
Paperwork faxed with corrected dates  Copy for scan Copy for pt  Mailed

## 2017-10-25 DIAGNOSIS — M159 Polyosteoarthritis, unspecified: Secondary | ICD-10-CM | POA: Diagnosis not present

## 2017-10-25 DIAGNOSIS — F39 Unspecified mood [affective] disorder: Secondary | ICD-10-CM | POA: Diagnosis not present

## 2017-10-25 DIAGNOSIS — F0150 Vascular dementia without behavioral disturbance: Secondary | ICD-10-CM

## 2017-10-25 DIAGNOSIS — E441 Mild protein-calorie malnutrition: Secondary | ICD-10-CM | POA: Diagnosis not present

## 2017-11-04 DIAGNOSIS — B351 Tinea unguium: Secondary | ICD-10-CM | POA: Diagnosis not present

## 2017-12-06 ENCOUNTER — Telehealth: Payer: Self-pay | Admitting: Internal Medicine

## 2017-12-06 DIAGNOSIS — Z0279 Encounter for issue of other medical certificate: Secondary | ICD-10-CM

## 2017-12-06 NOTE — Telephone Encounter (Signed)
Form done--let daughter know $39 charge

## 2017-12-06 NOTE — Telephone Encounter (Signed)
Pt daughter, Hubert Azure, dropped off form from [employer] to be filled out. She requests a call when ready to pick up. I placed in Rx tower.

## 2017-12-06 NOTE — Telephone Encounter (Signed)
Forms in his Inbox on his desk.

## 2017-12-08 IMAGING — CT CT CERVICAL SPINE W/O CM
3 of 4 series · 12 of 34 positions shown, 14 images · non-contrast
Comparison: 09/11/2016

CLINICAL DATA: Extreme neck pain history of C1 fracture

EXAM:
CT CERVICAL SPINE WITHOUT CONTRAST
TECHNIQUE: Multidetector CT imaging of the cervical spine was performed without
intravenous contrast. Multiplanar CT image reconstructions were also
generated.

[Series 4: sagittal bone · sagittal · 0.23mm/px · 5 of 50 slices shown, 6 images]
[im 17/50  bone]
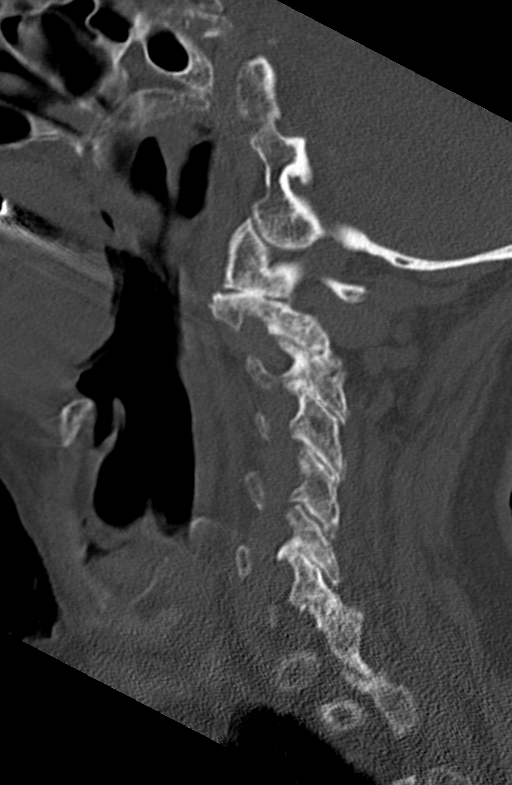
[im 21/50  bone]
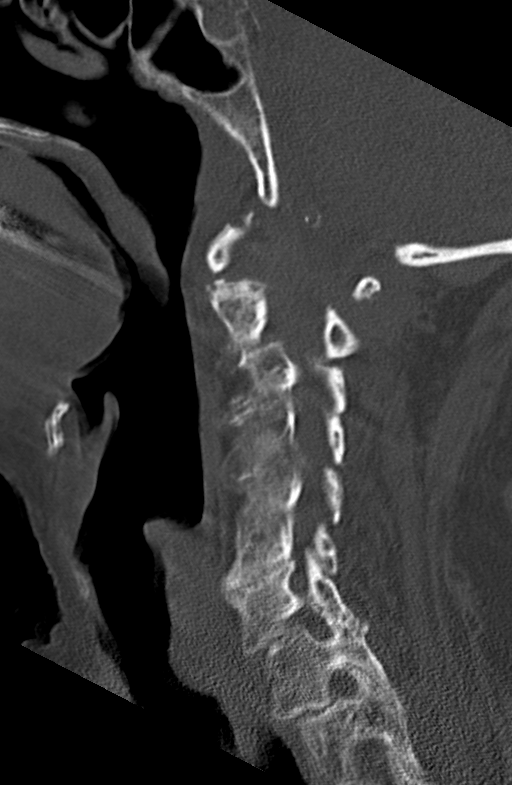
[im 25/50  soft-tissue]
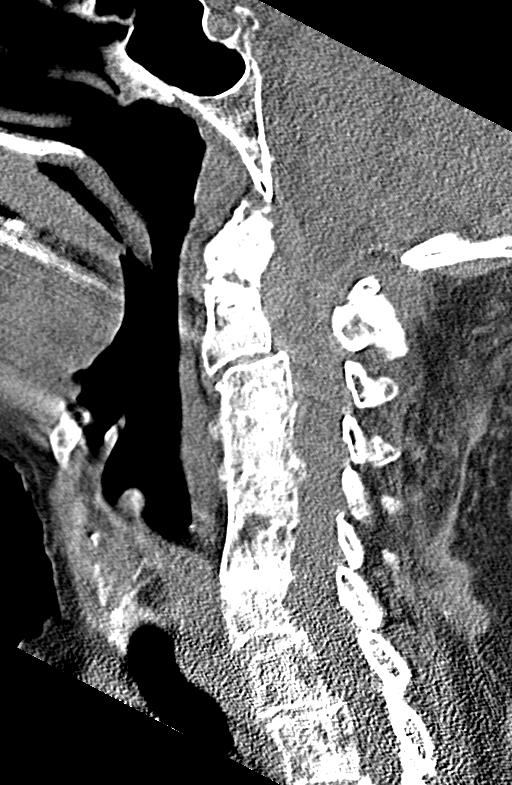
[im 25/50  bone]
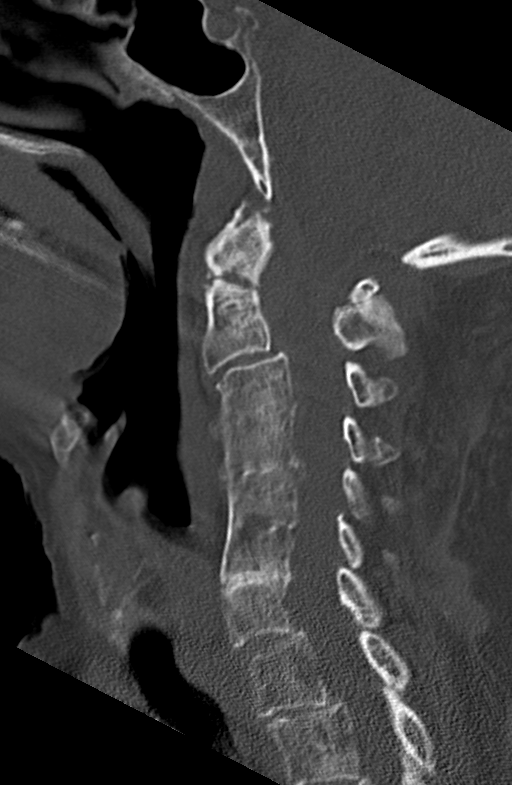
[im 29/50  bone]
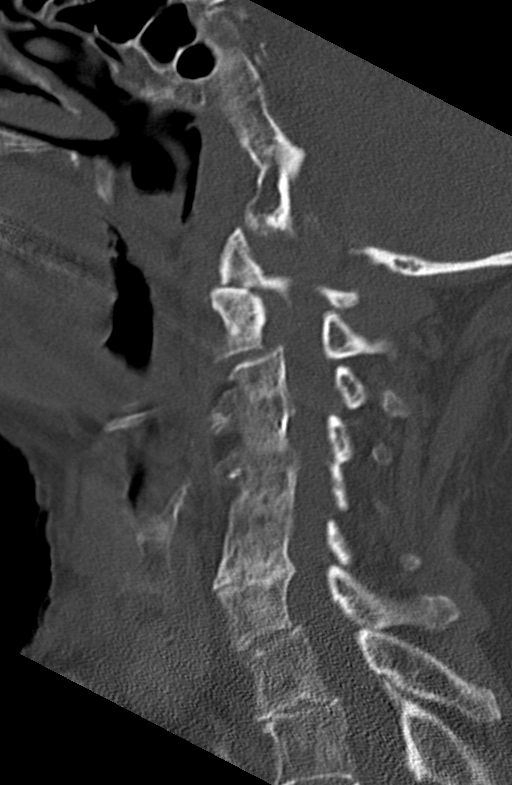
[im 33/50  bone]
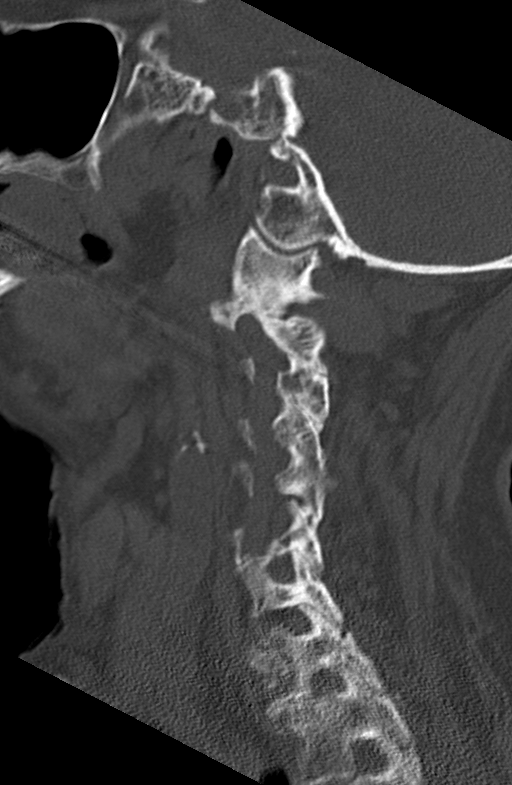

[Series 5: coronal bone · coronal · 0.23mm/px · 3 of 42 slices shown]
[im 9/42  bone]
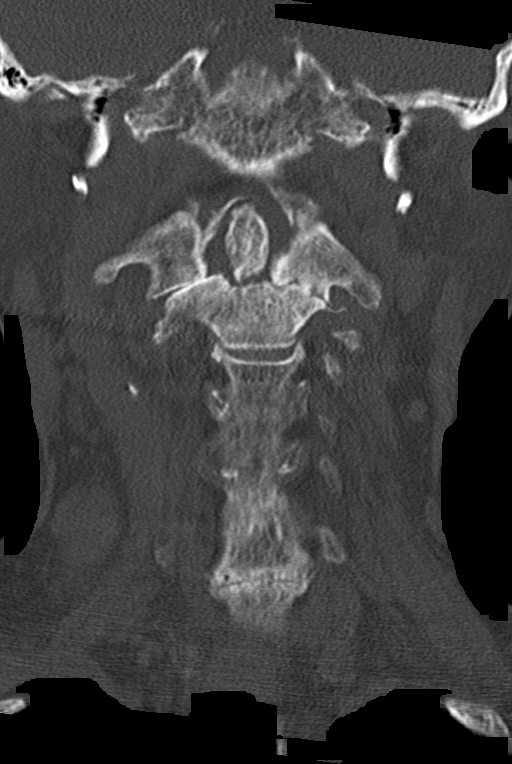
[im 17/42  bone]
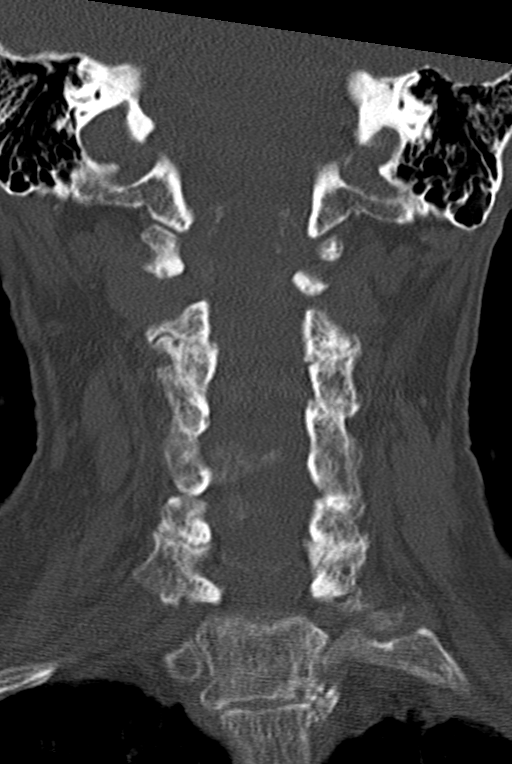
[im 25/42  bone]
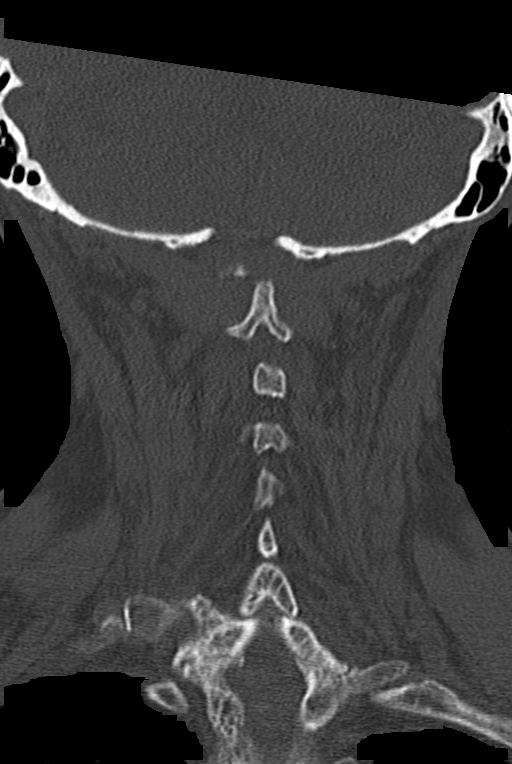

[Series 6: orthogonal bone · axial · 0.23mm/px · z∈[-313,-211]mm · 4 of 88 slices shown, 5 images]
[im 15/88  soft-tissue]
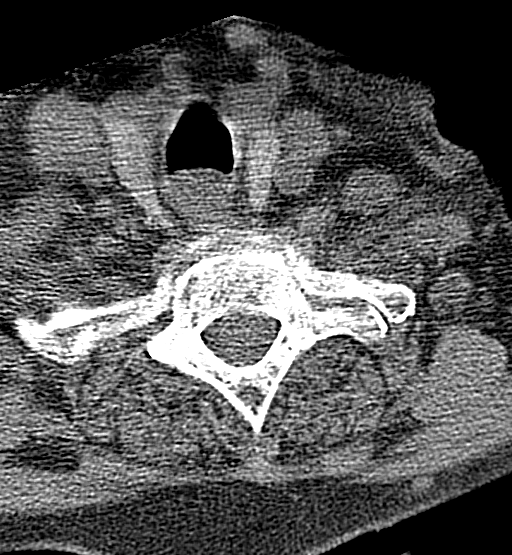
[im 15/88  bone]
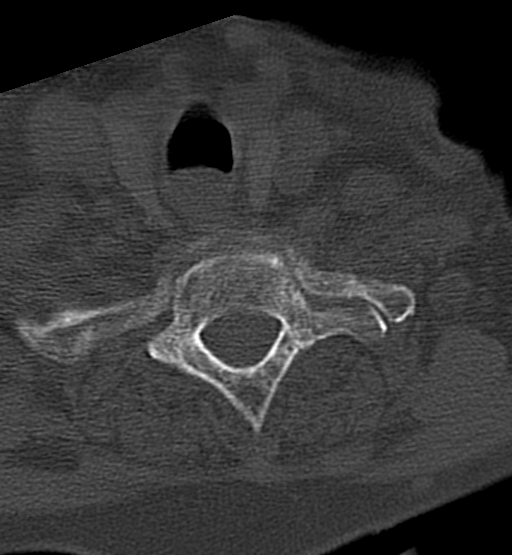
[im 30/88  bone]
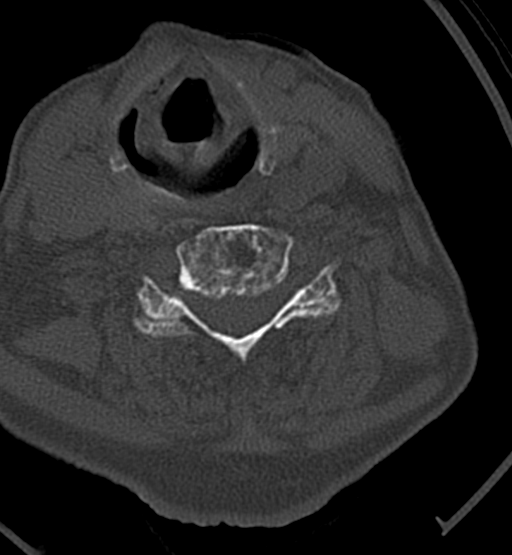
[im 59/88  bone]
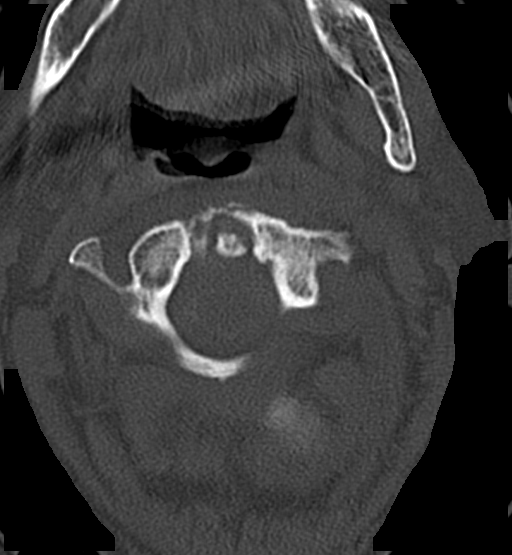
[im 73/88  bone]
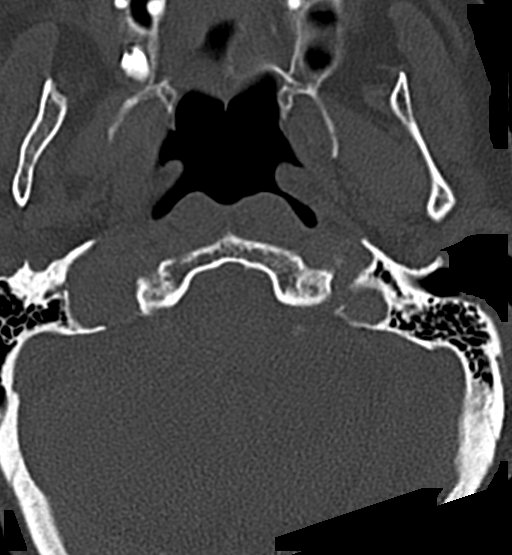

[12 of 34 positions shown; findings below may reference images not displayed]

FINDINGS: Alignment: Anterolisthesis of C2 on C3 measuring 3 mm, compared to 2
mm previously. Cervical alignment otherwise similar compared to
previous. Trace anterolisthesis of C7 on T1. Slight offset of the
lateral masses but similar compared to prior exam.

Skull base and vertebrae: Craniovertebral junction is intact.
Nonunion of previously demonstrated fracture through the base of the
odontoid process. No new fracture is seen.

Soft tissues and spinal canal: Prevertebral soft tissue thickness
similar compared to previous. Prominent dense tissue posterior to
the dens, similar in appearance to the previous exam.

Disc levels: Bony fusion between C3 and C7. Multilevel hypertrophic
facet arthropathy with multilevel foraminal stenosis.

Upper chest: Lung apices show mild scarring. Tiny hypodense thyroid
nodules.

Other: None
IMPRESSION: 1. Re- demonstrated type 2 dens fracture of C2. There is nonunion of
fracture. 3 mm anterolisthesis of C2 on C[DATE] be minimally
increased compared to the prior CT. Alignment is otherwise stable.
2. Prominent slightly dense tissues around the odontoid could relate
to inflammatory pannus or posttraumatic changes, the appearance does
not appear significantly changed
3. Bony fusion from C3 through C6 with marked narrowing/partial
fusion C6-C7. Multilevel degenerative changes.

## 2017-12-20 DIAGNOSIS — R062 Wheezing: Secondary | ICD-10-CM | POA: Diagnosis not present

## 2017-12-21 DIAGNOSIS — F39 Unspecified mood [affective] disorder: Secondary | ICD-10-CM | POA: Diagnosis not present

## 2017-12-21 DIAGNOSIS — E43 Unspecified severe protein-calorie malnutrition: Secondary | ICD-10-CM

## 2017-12-21 DIAGNOSIS — F0150 Vascular dementia without behavioral disturbance: Secondary | ICD-10-CM | POA: Diagnosis not present

## 2017-12-21 DIAGNOSIS — M1990 Unspecified osteoarthritis, unspecified site: Secondary | ICD-10-CM | POA: Diagnosis not present

## 2017-12-30 DIAGNOSIS — L03011 Cellulitis of right finger: Secondary | ICD-10-CM | POA: Diagnosis not present

## 2018-01-23 DIAGNOSIS — M159 Polyosteoarthritis, unspecified: Secondary | ICD-10-CM | POA: Diagnosis not present

## 2018-01-23 DIAGNOSIS — F39 Unspecified mood [affective] disorder: Secondary | ICD-10-CM

## 2018-01-23 DIAGNOSIS — E441 Mild protein-calorie malnutrition: Secondary | ICD-10-CM | POA: Diagnosis not present

## 2018-01-23 DIAGNOSIS — F0150 Vascular dementia without behavioral disturbance: Secondary | ICD-10-CM | POA: Diagnosis not present

## 2018-01-29 IMAGING — US US RENAL
1 series · 14 of 25 positions shown · non-contrast
Comparison: CT abdomen pelvis dated 09/30/2009

CLINICAL DATA: UTI, urinary retention

EXAM:
RENAL / URINARY TRACT ULTRASOUND COMPLETE

[Series 1: us renal · 0.22mm/px · 14 of 34 slices shown]
[im 1/34]
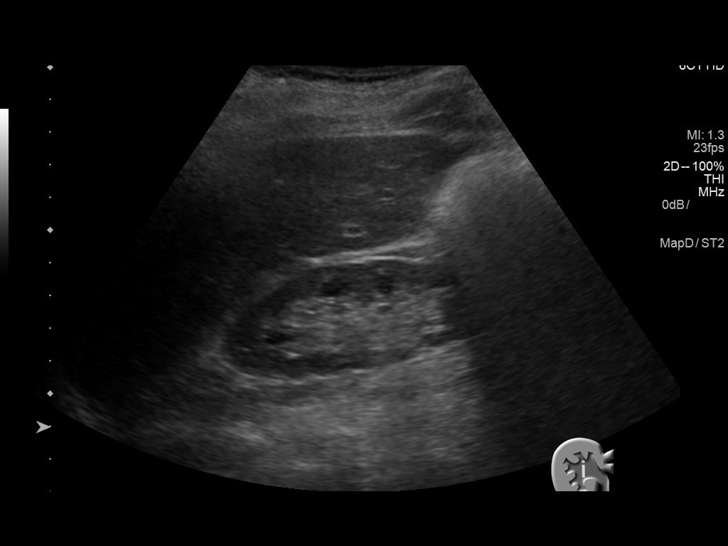
[im 3/34]
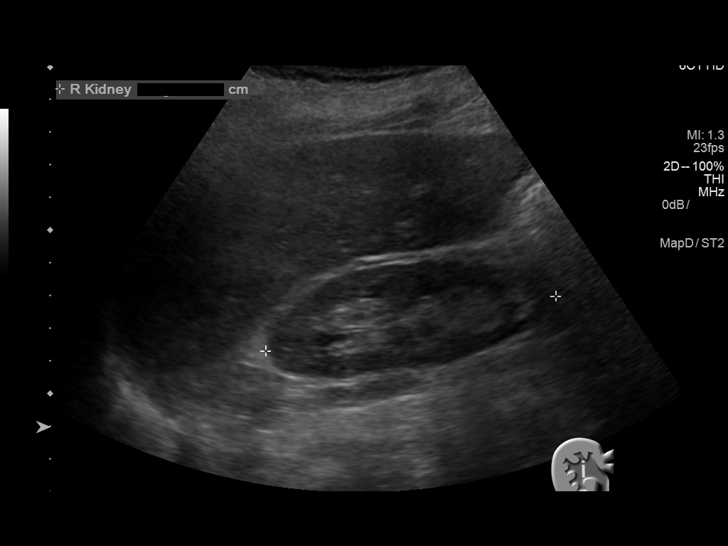
[im 6/34]
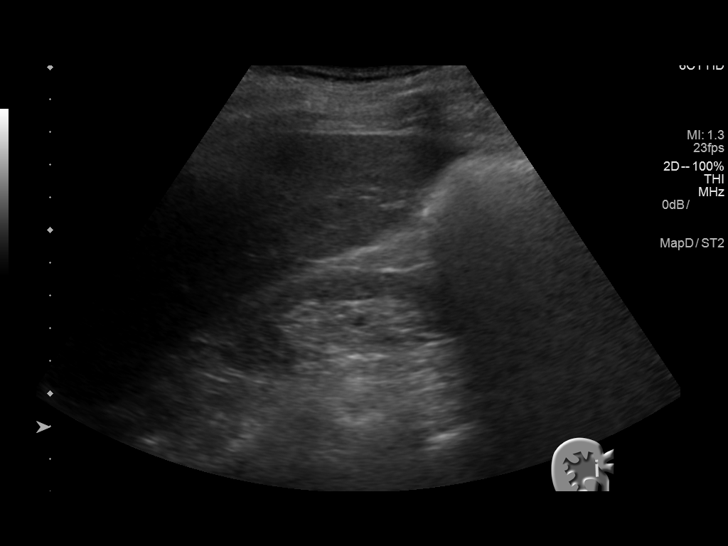
[im 9/34]
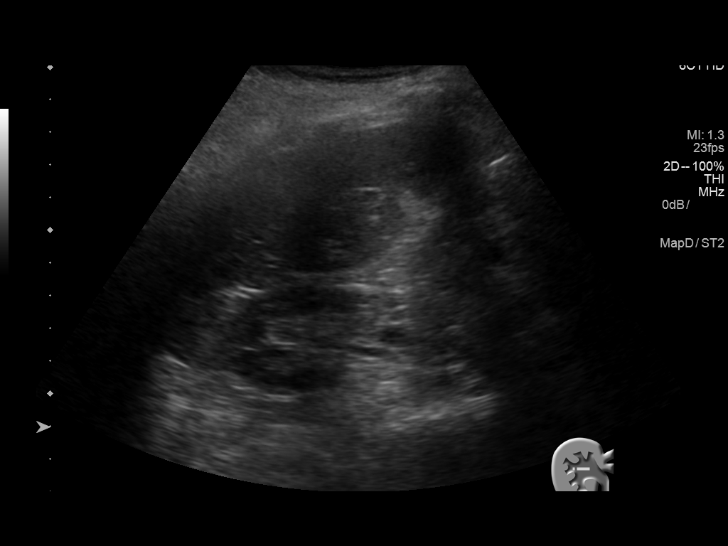
[im 12/34]
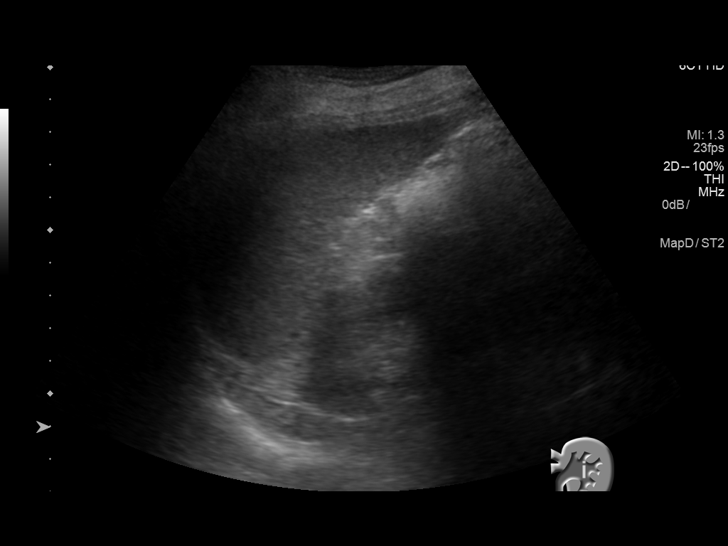
[im 13/34]
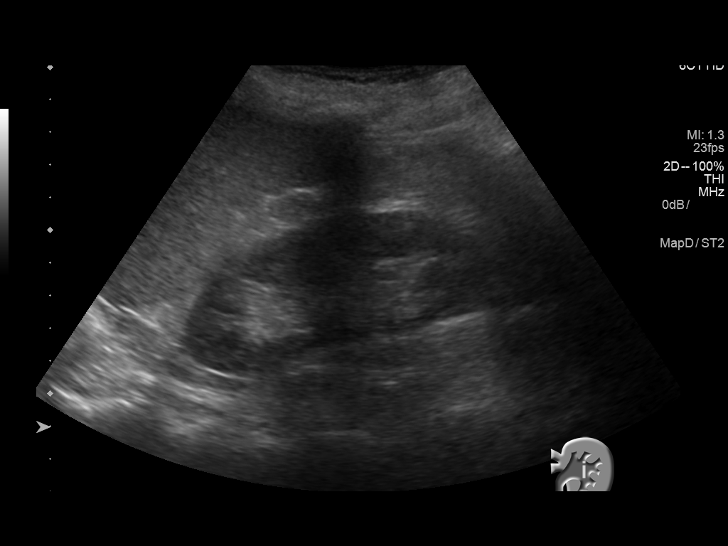
[im 16/34]
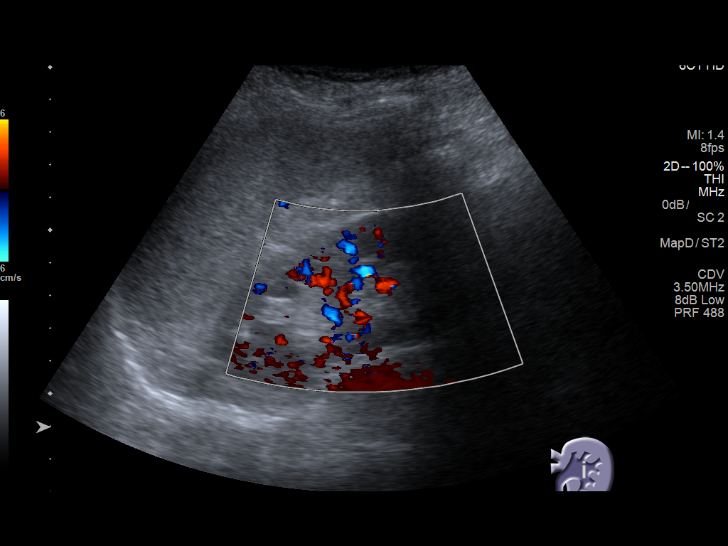
[im 18/34]
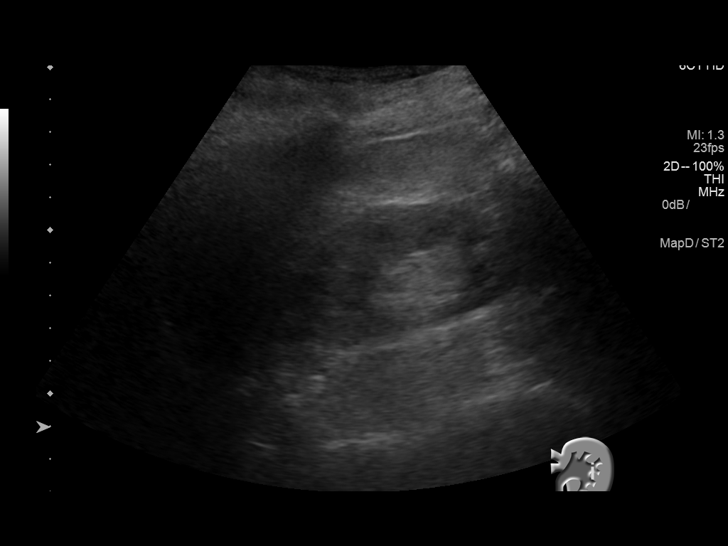
[im 21/34]
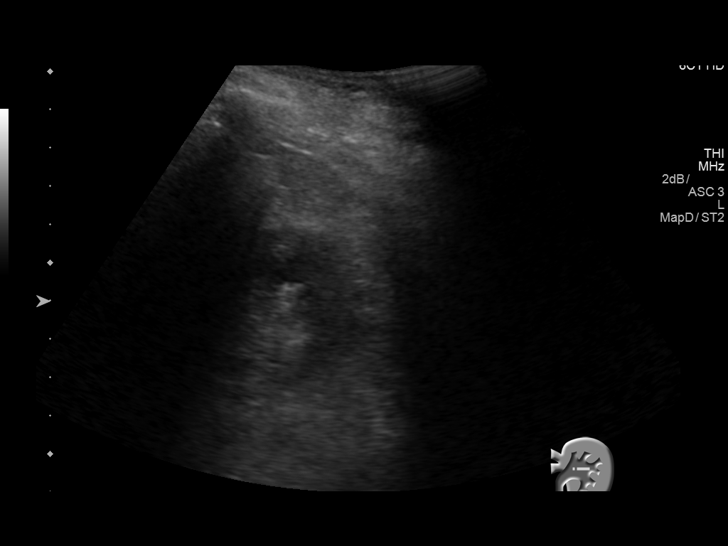
[im 23/34]
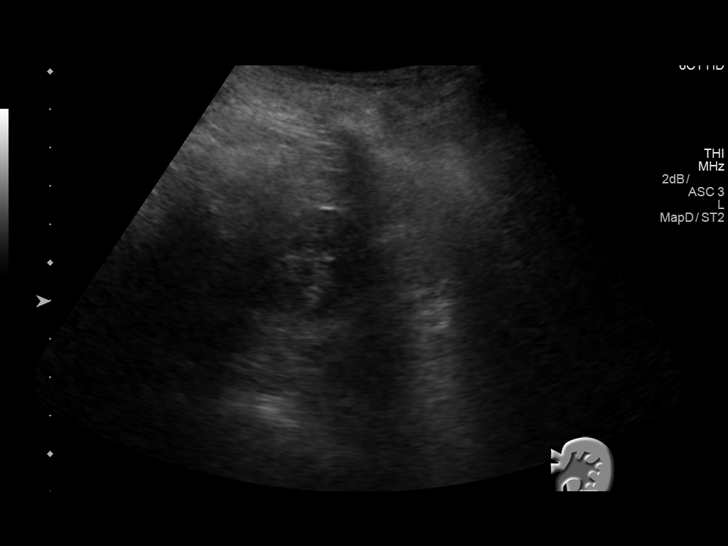
[im 25/34]
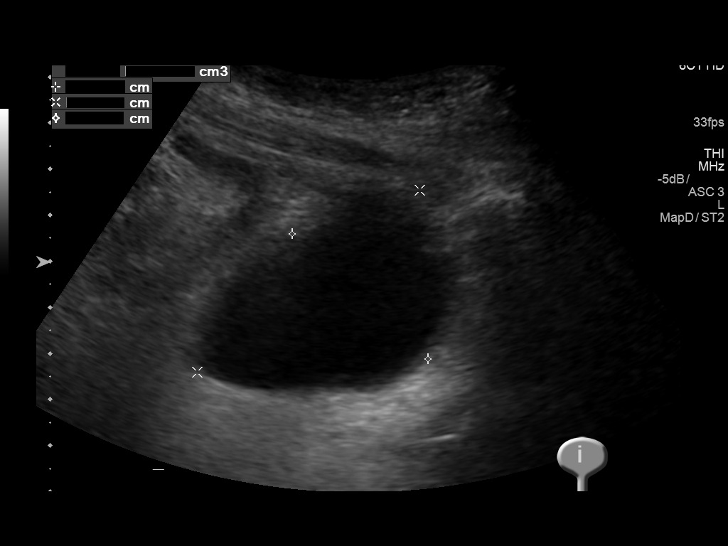
[im 28/34]
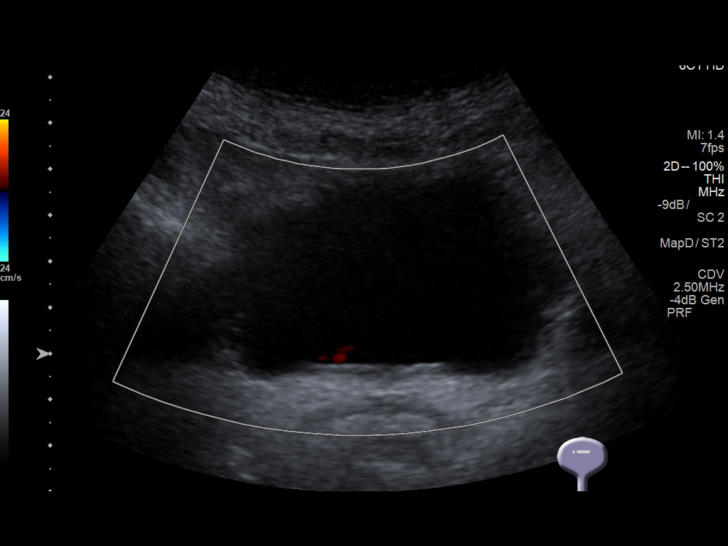
[im 31/34]
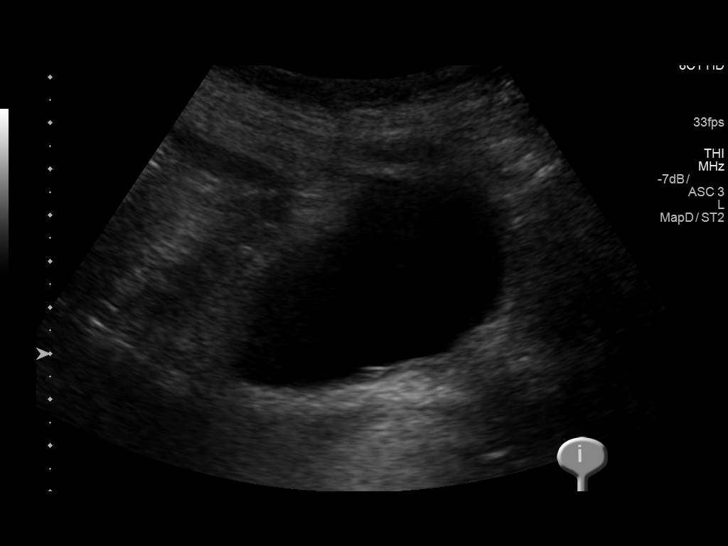
[im 34/34]
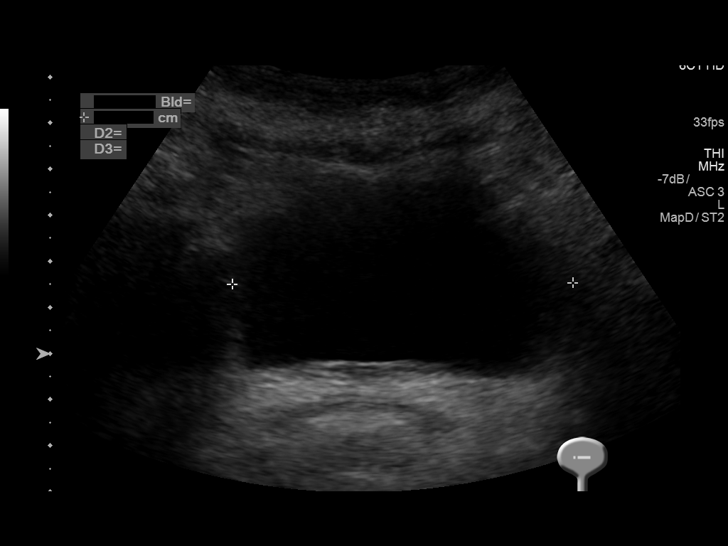

[14 of 25 positions shown; findings below may reference images not displayed]

FINDINGS: Right Kidney:

Length: 9.0 cm.  No mass or hydronephrosis.

Left Kidney:

Length: 10.6 cm.  No mass or hydronephrosis.

Bladder:

Within normal limits.  Moderate postvoid residual (100 mL).
IMPRESSION: Moderate postvoid bladder residual (100 mL).

No hydronephrosis.

## 2018-02-01 DIAGNOSIS — B351 Tinea unguium: Secondary | ICD-10-CM | POA: Diagnosis not present

## 2018-02-14 ENCOUNTER — Encounter: Payer: Self-pay | Admitting: Internal Medicine

## 2018-02-14 ENCOUNTER — Telehealth: Payer: Self-pay | Admitting: Internal Medicine

## 2018-02-14 DIAGNOSIS — S60221A Contusion of right hand, initial encounter: Secondary | ICD-10-CM | POA: Diagnosis not present

## 2018-02-14 NOTE — Telephone Encounter (Signed)
Letter written  No charge If they need the letter on [facility], they need to [employer]-- Education officer, museum there

## 2018-02-14 NOTE — Telephone Encounter (Signed)
Letter faxed to [facility] and Ricky Ala notified letter was faxed.

## 2018-02-14 NOTE — Telephone Encounter (Signed)
Copied from [place] [phone]. Topic: Inquiry >> Feb 14, 2018 10:12 AM [name] wrote: Reason for CRM: pt is needing a letter on the facilities letter head stating the pt is: confined to a facility due to severe disability and unable to be transported to the [facility] office Have letter signed by pcp and faxed to [phone] This is for a photo id

## 2018-03-13 DIAGNOSIS — L03115 Cellulitis of right lower limb: Secondary | ICD-10-CM

## 2018-04-12 DIAGNOSIS — B351 Tinea unguium: Secondary | ICD-10-CM

## 2018-04-21 DIAGNOSIS — E43 Unspecified severe protein-calorie malnutrition: Secondary | ICD-10-CM | POA: Diagnosis not present

## 2018-04-21 DIAGNOSIS — F0150 Vascular dementia without behavioral disturbance: Secondary | ICD-10-CM | POA: Diagnosis not present

## 2018-04-21 DIAGNOSIS — M1990 Unspecified osteoarthritis, unspecified site: Secondary | ICD-10-CM | POA: Diagnosis not present

## 2018-04-28 DIAGNOSIS — S51801A Unspecified open wound of right forearm, initial encounter: Secondary | ICD-10-CM

## 2018-05-04 DIAGNOSIS — L89610 Pressure ulcer of right heel, unstageable: Secondary | ICD-10-CM

## 2018-05-04 DIAGNOSIS — L03119 Cellulitis of unspecified part of limb: Secondary | ICD-10-CM | POA: Diagnosis not present

## 2018-05-17 DIAGNOSIS — R682 Dry mouth, unspecified: Secondary | ICD-10-CM | POA: Diagnosis not present

## 2018-06-23 DIAGNOSIS — B351 Tinea unguium: Secondary | ICD-10-CM

## 2018-06-27 DIAGNOSIS — F0150 Vascular dementia without behavioral disturbance: Secondary | ICD-10-CM

## 2018-06-27 DIAGNOSIS — E440 Moderate protein-calorie malnutrition: Secondary | ICD-10-CM

## 2018-06-27 DIAGNOSIS — M159 Polyosteoarthritis, unspecified: Secondary | ICD-10-CM

## 2018-06-27 DIAGNOSIS — F39 Unspecified mood [affective] disorder: Secondary | ICD-10-CM | POA: Diagnosis not present

## 2018-06-30 ENCOUNTER — Telehealth: Payer: Self-pay | Admitting: Family Medicine

## 2018-06-30 NOTE — Telephone Encounter (Signed)
Lana advised.

## 2018-06-30 NOTE — Telephone Encounter (Addendum)
Lana at [facility] asked if pt was allergic to morphine and what type of reaction [phone]

## 2018-07-15 DEATH — deceased
